# Patient Record
Sex: Male | Born: 1950 | Race: Black or African American | Hispanic: No | Marital: Single
Health system: Southern US, Community
[De-identification: ages and names within clinical notes are randomized; demographics above are authoritative.]

## PROBLEM LIST (undated history)

## (undated) DIAGNOSIS — W3400XA Accidental discharge from unspecified firearms or gun, initial encounter: Secondary | ICD-10-CM

## (undated) DIAGNOSIS — E119 Type 2 diabetes mellitus without complications: Secondary | ICD-10-CM

## (undated) DIAGNOSIS — F329 Major depressive disorder, single episode, unspecified: Secondary | ICD-10-CM

## (undated) DIAGNOSIS — H5440 Blindness, one eye, unspecified eye: Secondary | ICD-10-CM

## (undated) DIAGNOSIS — F1910 Other psychoactive substance abuse, uncomplicated: Secondary | ICD-10-CM

## (undated) DIAGNOSIS — F32A Depression, unspecified: Secondary | ICD-10-CM

## (undated) HISTORY — PX: LEG SURGERY: SHX1003

## (undated) HISTORY — PX: EYE SURGERY: SHX253

## (undated) HISTORY — PX: LUNG SURGERY: SHX703

---
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
## 1997-09-06 ENCOUNTER — Emergency Department (HOSPITAL_COMMUNITY): Admission: EM | Admit: 1997-09-06 | Discharge: 1997-09-06 | Payer: Self-pay | Admitting: Emergency Medicine

## 1999-03-04 ENCOUNTER — Emergency Department (HOSPITAL_COMMUNITY): Admission: EM | Admit: 1999-03-04 | Discharge: 1999-03-04 | Payer: Self-pay | Admitting: Emergency Medicine

## 1999-03-04 ENCOUNTER — Encounter: Payer: Self-pay | Admitting: Emergency Medicine

## 1999-05-13 ENCOUNTER — Encounter: Payer: Self-pay | Admitting: *Deleted

## 1999-05-13 ENCOUNTER — Emergency Department (HOSPITAL_COMMUNITY): Admission: EM | Admit: 1999-05-13 | Discharge: 1999-05-13 | Payer: Self-pay | Admitting: *Deleted

## 1999-05-16 ENCOUNTER — Emergency Department (HOSPITAL_COMMUNITY): Admission: EM | Admit: 1999-05-16 | Discharge: 1999-05-16 | Payer: Self-pay | Admitting: Emergency Medicine

## 1999-10-10 ENCOUNTER — Emergency Department (HOSPITAL_COMMUNITY): Admission: EM | Admit: 1999-10-10 | Discharge: 1999-10-10 | Payer: Self-pay | Admitting: Emergency Medicine

## 1999-10-10 ENCOUNTER — Encounter: Payer: Self-pay | Admitting: Emergency Medicine

## 2001-02-10 ENCOUNTER — Encounter: Payer: Self-pay | Admitting: Emergency Medicine

## 2001-02-10 ENCOUNTER — Emergency Department (HOSPITAL_COMMUNITY): Admission: EM | Admit: 2001-02-10 | Discharge: 2001-02-10 | Payer: Self-pay

## 2001-10-25 ENCOUNTER — Emergency Department (HOSPITAL_COMMUNITY): Admission: EM | Admit: 2001-10-25 | Discharge: 2001-10-25 | Payer: Self-pay | Admitting: Emergency Medicine

## 2005-07-26 ENCOUNTER — Emergency Department (HOSPITAL_COMMUNITY): Admission: EM | Admit: 2005-07-26 | Discharge: 2005-07-26 | Payer: Self-pay | Admitting: Emergency Medicine

## 2007-03-16 ENCOUNTER — Emergency Department (HOSPITAL_COMMUNITY): Admission: EM | Admit: 2007-03-16 | Discharge: 2007-03-16 | Payer: Self-pay | Admitting: Emergency Medicine

## 2009-07-31 ENCOUNTER — Emergency Department (HOSPITAL_COMMUNITY): Admission: EM | Admit: 2009-07-31 | Discharge: 2009-07-31 | Payer: Self-pay | Admitting: Emergency Medicine

## 2010-01-10 ENCOUNTER — Emergency Department (HOSPITAL_COMMUNITY): Admission: EM | Admit: 2010-01-10 | Discharge: 2010-01-10 | Payer: Self-pay | Admitting: Emergency Medicine

## 2010-02-19 DIAGNOSIS — R45851 Suicidal ideations: Secondary | ICD-10-CM

## 2010-02-19 HISTORY — DX: Suicidal ideations: R45.851

## 2010-03-03 ENCOUNTER — Emergency Department (HOSPITAL_COMMUNITY): Admission: EM | Admit: 2010-03-03 | Discharge: 2010-03-03 | Payer: Self-pay | Admitting: Emergency Medicine

## 2010-03-03 ENCOUNTER — Emergency Department (HOSPITAL_COMMUNITY): Admission: EM | Admit: 2010-03-03 | Discharge: 2010-03-04 | Payer: Self-pay | Admitting: Emergency Medicine

## 2010-03-04 ENCOUNTER — Ambulatory Visit: Payer: Self-pay | Admitting: Psychiatry

## 2010-03-04 ENCOUNTER — Inpatient Hospital Stay (HOSPITAL_COMMUNITY): Admission: AD | Admit: 2010-03-04 | Discharge: 2010-03-13 | Payer: Self-pay | Admitting: Psychiatry

## 2010-07-03 LAB — DIFFERENTIAL
Eosinophils Absolute: 0.2 10*3/uL (ref 0.0–0.7)
Eosinophils Relative: 5 % (ref 0–5)
Lymphocytes Relative: 32 % (ref 12–46)
Lymphs Abs: 1.3 10*3/uL (ref 0.7–4.0)
Monocytes Absolute: 0.4 10*3/uL (ref 0.1–1.0)
Neutrophils Relative %: 53 % (ref 43–77)

## 2010-07-03 LAB — BASIC METABOLIC PANEL
CO2: 28 mEq/L (ref 19–32)
Chloride: 104 mEq/L (ref 96–112)
Creatinine, Ser: 1.2 mg/dL (ref 0.4–1.5)
Glucose, Bld: 88 mg/dL (ref 70–99)

## 2010-07-03 LAB — RAPID URINE DRUG SCREEN, HOSP PERFORMED
Barbiturates: NOT DETECTED
Benzodiazepines: NOT DETECTED
Opiates: NOT DETECTED

## 2010-07-03 LAB — ETHANOL: Alcohol, Ethyl (B): <5 mg/dL (ref 0–10)

## 2010-07-03 LAB — CBC
HCT: 40 % (ref 39.0–52.0)
Hemoglobin: 13.6 g/dL (ref 13.0–17.0)
MCHC: 34 g/dL (ref 30.0–36.0)
Platelets: 202 10*3/uL (ref 150–400)

## 2010-07-10 LAB — BASIC METABOLIC PANEL
CO2: 28 mEq/L (ref 19–32)
Chloride: 102 mEq/L (ref 96–112)
Creatinine, Ser: 1.25 mg/dL (ref 0.4–1.5)
GFR calc Af Amer: >60 mL/min (ref >60)
GFR calc non Af Amer: 59 mL/min — ABNORMAL LOW (ref >60)
Glucose, Bld: 91 mg/dL (ref 70–99)
Sodium: 138 mEq/L (ref 135–145)

## 2010-07-10 LAB — DIFFERENTIAL
Basophils Absolute: 0 10*3/uL (ref 0.0–0.1)
Basophils Relative: 0 % (ref 0–1)
Eosinophils Absolute: 0.1 10*3/uL (ref 0.0–0.7)
Monocytes Absolute: 0.3 10*3/uL (ref 0.1–1.0)
Neutro Abs: 2.7 10*3/uL (ref 1.7–7.7)

## 2010-07-10 LAB — CBC
HCT: 39.3 % (ref 39.0–52.0)
Hemoglobin: 13.3 g/dL (ref 13.0–17.0)
RDW: 13.2 % (ref 11.5–15.5)

## 2010-07-10 LAB — HEPATIC FUNCTION PANEL
ALT: 68 U/L — ABNORMAL HIGH (ref 0–53)
AST: 137 U/L — ABNORMAL HIGH (ref 0–37)
Albumin: 3.9 g/dL (ref 3.5–5.2)
Total Bilirubin: 0.7 mg/dL (ref 0.3–1.2)

## 2010-07-10 LAB — ETHANOL: Alcohol, Ethyl (B): 58 mg/dL — ABNORMAL HIGH (ref 0–10)

## 2012-02-11 ENCOUNTER — Emergency Department (HOSPITAL_COMMUNITY): Payer: Self-pay

## 2012-02-11 ENCOUNTER — Encounter (HOSPITAL_COMMUNITY): Payer: Self-pay | Admitting: Physical Medicine and Rehabilitation

## 2012-02-11 ENCOUNTER — Emergency Department (HOSPITAL_COMMUNITY)
Admission: EM | Admit: 2012-02-11 | Discharge: 2012-02-11 | Disposition: A | Discharge status: Home / Self Care | Payer: Self-pay | Attending: Emergency Medicine | Admitting: Emergency Medicine

## 2012-02-11 DIAGNOSIS — F17200 Nicotine dependence, unspecified, uncomplicated: Secondary | ICD-10-CM | POA: Insufficient documentation

## 2012-02-11 DIAGNOSIS — G43909 Migraine, unspecified, not intractable, without status migrainosus: Secondary | ICD-10-CM | POA: Insufficient documentation

## 2012-02-11 DIAGNOSIS — J3489 Other specified disorders of nose and nasal sinuses: Secondary | ICD-10-CM | POA: Insufficient documentation

## 2012-02-11 MED ORDER — METOCLOPRAMIDE HCL 5 MG/ML IJ SOLN
10.0000 mg | Freq: Once | INTRAMUSCULAR | Status: AC
  Administered 2012-02-11: 10 mg via INTRAMUSCULAR
  Filled 2012-02-11: qty 2

## 2012-02-11 MED ORDER — DIPHENHYDRAMINE HCL 50 MG/ML IJ SOLN
25.0000 mg | Freq: Once | INTRAMUSCULAR | Status: AC
  Administered 2012-02-11: 14:00:00 via INTRAMUSCULAR
  Filled 2012-02-11: qty 1

## 2012-02-11 NOTE — ED Notes (Signed)
Patient transported to CT 

## 2012-02-11 NOTE — ED Provider Notes (Signed)
History   This chart was scribed for Toy Baker, MD by Charolett Bumpers . The patient was seen in room TR07C/TR07C. Patient's care was started at 1357.   CSN: [id number] Arrival date & time 02/11/12  1317  First MD Initiated Contact with Patient 02/11/12 1357    Chief Complaint  Patient presents with  . Headache  . Nasal Congestion   The history is provided by the patient. No language interpreter was used.  Jeremiah West is a 61 y.o. male who presents to the Emergency Department complaining of constant, moderate headache that started yesterday that while he was sleeping. He states it is located around his eyes and radiates to back of neck. He reports his symptoms are aggravated with light. He reports associated congestion and rhinorrhea. He denies any fevers, chills, vomiting or focal weakness. He reports a h/o similar migraines. Denies any known triggers. He denies taking anything for pain PTA.   No past medical history on file.  No past surgical history on file.  History reviewed. No pertinent family history.  History  Substance Use Topics  . Smoking status: Current Every Day Smoker  . Smokeless tobacco: Not on file  . Alcohol Use: Yes      Review of Systems  Constitutional: Negative for fever and chills.  HENT: Positive for congestion and rhinorrhea.   Respiratory: Negative for shortness of breath.   Gastrointestinal: Negative for nausea and vomiting.  Neurological: Positive for headaches. Negative for weakness.  All other systems reviewed and are negative.    Allergies  Review of patient's allergies indicates no known allergies.  Home Medications  No current outpatient prescriptions on file.  BP 142/69  Pulse 81  Temp 98 F (36.7 C) (Oral)  Resp 18  SpO2 96%  Physical Exam  Nursing note and vitals reviewed. Constitutional: He is oriented to person, place, and time. He appears well-developed and well-nourished.  Non-toxic appearance.  HENT:    Head: Normocephalic and atraumatic.  Eyes: Conjunctivae normal are normal.       Absent pupil on right. Left pupil is 3 mm, round and reactive.   Neck: Normal range of motion.  Cardiovascular: Normal rate.   Pulmonary/Chest: Effort normal.  Neurological: He is alert and oriented to person, place, and time.       Normal gait. Normal neuro exam with no deficits.   Skin: Skin is warm and dry.  Psychiatric: He has a normal mood and affect.    ED Course  Procedures (including critical care time)  DIAGNOSTIC STUDIES: Oxygen Saturation is 96% on room air, normal by my interpretation.    COORDINATION OF CARE:  14:01-Discussed planned course of treatment with the patient including a CT of head, Reglan and Benadryl, who is agreeable at this time.   14:15-Medication Orders: Metoclopramide (Reglan) injection 10 mg-once; Diphenhydramine (Benadryl) injection 25 mg-once.   15:21-Recheck: Informed pt of normal head CT results. Pt reports improvement of symptoms with ED medications. Will d/c home. Pt is agreeable.   Labs Reviewed - No data to display Ct Head Wo Contrast  02/11/2012  *RADIOLOGY REPORT*  Clinical Data: New onset headache.  CT HEAD WITHOUT CONTRAST  Technique:  Contiguous axial images were obtained from the base of the skull through the vertex without contrast.  Comparison: 08/01/2011  Findings: No sign of acute infarction, mass lesion, hemorrhage, hydrocephalus or extra-axial collection.  There is advanced confluent low density throughout the cerebral hemispheric white matter consistent with chronic small vessel disease.  Chronic changes of the right globe identified.  No sign of sinus inflammatory disease.  No calvarial abnormality.  IMPRESSION: No acute finding.  Advanced chronic small vessel disease throughout the hemispheric white matter.   Original Report Authenticated By: Thomasenia Sales, M.D.      No diagnosis found.    MDM   Patient given migraine cocktail here feels  better. Repeat neurological exam stable. Doubt subarachnoid hemorrhage or meningitis. He is to for discharge   I personally performed the services described in this documentation, which was scribed in my presence. The recorded information has been reviewed and considered.     Toy Baker, MD 02/11/12 [phone]

## 2012-02-11 NOTE — ED Notes (Signed)
C/o "headache" from back of neck up back of head.

## 2012-02-11 NOTE — ED Notes (Signed)
Pt presents to department for evaluation of nasal congestion and headache x2 days. Pt states "my nose has been runny." 5/10 pain at the time. Denies fever. He is conscious alert and oriented x4. No signs of distress noted.

## 2013-12-25 ENCOUNTER — Encounter (HOSPITAL_COMMUNITY): Payer: Self-pay | Admitting: Emergency Medicine

## 2013-12-25 ENCOUNTER — Emergency Department (HOSPITAL_COMMUNITY)
Admission: EM | Admit: 2013-12-25 | Discharge: 2013-12-25 | Disposition: A | Discharge status: Home / Self Care | Payer: Medicare Other | Attending: Emergency Medicine | Admitting: Emergency Medicine

## 2013-12-25 DIAGNOSIS — Z9889 Other specified postprocedural states: Secondary | ICD-10-CM | POA: Insufficient documentation

## 2013-12-25 DIAGNOSIS — G8911 Acute pain due to trauma: Secondary | ICD-10-CM | POA: Diagnosis not present

## 2013-12-25 DIAGNOSIS — F17200 Nicotine dependence, unspecified, uncomplicated: Secondary | ICD-10-CM | POA: Diagnosis not present

## 2013-12-25 DIAGNOSIS — M25569 Pain in unspecified knee: Secondary | ICD-10-CM | POA: Insufficient documentation

## 2013-12-25 DIAGNOSIS — M25562 Pain in left knee: Secondary | ICD-10-CM

## 2013-12-25 DIAGNOSIS — Z87828 Personal history of other (healed) physical injury and trauma: Secondary | ICD-10-CM | POA: Diagnosis not present

## 2013-12-25 DIAGNOSIS — E119 Type 2 diabetes mellitus without complications: Secondary | ICD-10-CM | POA: Diagnosis not present

## 2013-12-25 HISTORY — DX: Type 2 diabetes mellitus without complications: E11.9

## 2013-12-25 HISTORY — DX: Accidental discharge from unspecified firearms or gun, initial encounter: W34.00XA

## 2013-12-25 MED ORDER — HYDROCODONE-ACETAMINOPHEN 5-325 MG PO TABS
1.0000 | ORAL_TABLET | ORAL | Status: DC | PRN
Start: 2013-12-25 — End: 2018-02-17

## 2013-12-25 MED ORDER — HYDROCODONE-ACETAMINOPHEN 5-325 MG PO TABS
1.0000 | ORAL_TABLET | Freq: Once | ORAL | Status: AC
  Administered 2013-12-25: 1 via ORAL
  Filled 2013-12-25: qty 1
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
## 2013-12-25 NOTE — ED Provider Notes (Signed)
Medical screening examination/treatment/procedure(s) were performed by non-physician practitioner and as supervising physician I was immediately available for consultation/collaboration.   EKG Interpretation None        Lyanne Co, MD 12/25/13 2132

## 2013-12-25 NOTE — ED Notes (Signed)
Declined W/C at D/C and was escorted to lobby by RN. 

## 2013-12-25 NOTE — Discharge Instructions (Signed)
Arthralgia °Your caregiver has diagnosed you as suffering from an arthralgia. Arthralgia means there is pain in a joint. This can come from many reasons including: °· Bruising the joint which causes soreness (inflammation) in the joint. °· Wear and tear on the joints which occur as we grow older (osteoarthritis). °· Overusing the joint. °· Various forms of arthritis. °· Infections of the joint. °Regardless of the cause of pain in your joint, most of these different pains respond to anti-inflammatory drugs and rest. The exception to this is when a joint is infected, and these cases are treated with antibiotics, if it is a bacterial infection. °HOME CARE INSTRUCTIONS  °· Rest the injured area for as long as directed by your caregiver. Then slowly start using the joint as directed by your caregiver and as the pain allows. Crutches as directed may be useful if the ankles, knees or hips are involved. If the knee was splinted or casted, continue use and care as directed. If an stretchy or elastic wrapping bandage has been applied today, it should be removed and re-applied every 3 to 4 hours. It should not be applied tightly, but firmly enough to keep swelling down. Watch toes and feet for swelling, bluish discoloration, coldness, numbness or excessive pain. If any of these problems (symptoms) occur, remove the ace bandage and re-apply more loosely. If these symptoms persist, contact your caregiver or return to this location. °· For the first 24 hours, keep the injured extremity elevated on pillows while lying down. °· Apply ice for 15-20 minutes to the sore joint every couple hours while awake for the first half day. Then 03-04 times per day for the first 48 hours. Put the ice in a plastic bag and place a towel between the bag of ice and your skin. °· Wear any splinting, casting, elastic bandage applications, or slings as instructed. °· Only take over-the-counter or prescription medicines for pain, discomfort, or fever as  directed by your caregiver. Do not use aspirin immediately after the injury unless instructed by your physician. Aspirin can cause increased bleeding and bruising of the tissues. °· If you were given crutches, continue to use them as instructed and do not resume weight bearing on the sore joint until instructed. °Persistent pain and inability to use the sore joint as directed for more than 2 to 3 days are warning signs indicating that you should see a caregiver for a follow-up visit as soon as possible. Initially, a hairline fracture (break in bone) may not be evident on X-rays. Persistent pain and swelling indicate that further evaluation, non-weight bearing or use of the joint (use of crutches or slings as instructed), or further X-rays are indicated. X-rays may sometimes not show a small fracture until a week or 10 days later. Make a follow-up appointment with your own caregiver or one to whom we have referred you. A radiologist (specialist in reading X-rays) may read your X-rays. Make sure you know how you are to obtain your X-ray results. Do not assume everything is normal if you do not hear from us. °SEEK MEDICAL CARE IF: °Bruising, swelling, or pain increases. °SEEK IMMEDIATE MEDICAL CARE IF:  °· Your fingers or toes are numb or blue. °· The pain is not responding to medications and continues to stay the same or get worse. °· The pain in your joint becomes severe. °· You develop a fever over 102° F (38.9° C). °· It becomes impossible to move or use the joint. °MAKE SURE YOU:  °·   Understand these instructions. °· Will watch your condition. °· Will get help right away if you are not doing well or get worse. °Document Released: 04/07/2005 Document Revised: 06/30/2011 Document Reviewed: 11/24/2007 °Exit[employer]® Patient Information ©2015 [employer]. This information is not intended to replace advice given to you by your health care provider. Make sure you discuss any questions you have with your health care  provider. ° °Cryotherapy °Cryotherapy means treatment with cold. Ice or gel packs can be used to reduce both pain and swelling. Ice is the most helpful within the first 24 to 48 hours after an injury or flare-up from overusing a muscle or joint. Sprains, strains, spasms, burning pain, shooting pain, and aches can all be eased with ice. Ice can also be used when recovering from surgery. Ice is effective, has very few side effects, and is safe for most people to use. °PRECAUTIONS  °Ice is not a safe treatment option for people with: °· Raynaud phenomenon. This is a condition affecting small blood vessels in the extremities. Exposure to cold may cause your problems to return. °· Cold hypersensitivity. There are many forms of cold hypersensitivity, including: °¨ Cold urticaria. Red, itchy hives appear on the skin when the tissues begin to warm after being iced. °¨ Cold erythema. This is a red, itchy rash caused by exposure to cold. °¨ Cold hemoglobinuria. Red blood cells break down when the tissues begin to warm after being iced. The hemoglobin that carry oxygen are passed into the urine because they cannot combine with blood proteins fast enough. °· Numbness or altered sensitivity in the area being iced. °If you have any of the following conditions, do not use ice until you have discussed cryotherapy with your caregiver: °· Heart conditions, such as arrhythmia, angina, or chronic heart disease. °· High blood pressure. °· Healing wounds or open skin in the area being iced. °· Current infections. °· Rheumatoid arthritis. °· Poor circulation. °· Diabetes. °Ice slows the blood flow in the region it is applied. This is beneficial when trying to stop inflamed tissues from spreading irritating chemicals to surrounding tissues. However, if you expose your skin to cold temperatures for too long or without the proper protection, you can damage your skin or nerves. Watch for signs of skin damage due to cold. °HOME CARE  INSTRUCTIONS °Follow these tips to use ice and cold packs safely. °· Place a dry or damp towel between the ice and skin. A damp towel will cool the skin more quickly, so you may need to shorten the time that the ice is used. °· For a more rapid response, add gentle compression to the ice. °· Ice for no more than 10 to 20 minutes at a time. The bonier the area you are icing, the less time it will take to get the benefits of ice. °· Check your skin after 5 minutes to make sure there are no signs of a poor response to cold or skin damage. °· Rest 20 minutes or more between uses. °· Once your skin is numb, you can end your treatment. You can test numbness by very lightly touching your skin. The touch should be so light that you do not see the skin dimple from the pressure of your fingertip. When using ice, most people will feel these normal sensations in this order: cold, burning, aching, and numbness. °· Do not use ice on someone who cannot communicate their responses to pain, such as small children or people with dementia. °HOW TO MAKE   AN ICE PACK °Ice packs are the most common way to use ice therapy. Other methods include ice massage, ice baths, and cryosprays. Muscle creams that cause a cold, tingly feeling do not offer the same benefits that ice offers and should not be used as a substitute unless recommended by your caregiver. °To make an ice pack, do one of the following: °· Place crushed ice or a bag of frozen vegetables in a sealable plastic bag. Squeeze out the excess air. Place this bag inside another plastic bag. Slide the bag into a pillowcase or place a damp towel between your skin and the bag. °· Mix 3 parts water with 1 part rubbing alcohol. Freeze the mixture in a sealable plastic bag. When you remove the mixture from the freezer, it will be slushy. Squeeze out the excess air. Place this bag inside another plastic bag. Slide the bag into a pillowcase or place a damp towel between your skin and the  bag. °SEEK MEDICAL CARE IF: °· You develop white spots on your skin. This may give the skin a blotchy (mottled) appearance. °· Your skin turns blue or pale. °· Your skin becomes waxy or hard. °· Your swelling gets worse. °MAKE SURE YOU:  °· Understand these instructions. °· Will watch your condition. °· Will get help right away if you are not doing well or get worse. °Document Released: 12/02/2010 Document Revised: 08/22/2013 Document Reviewed: 12/02/2010 °ExitCare® Patient Information ©2015 [employer]. This information is not intended to replace advice given to you by your health care provider. Make sure you discuss any questions you have with your health care provider. ° °
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
## 2015-12-13 ENCOUNTER — Emergency Department (HOSPITAL_COMMUNITY): Payer: Medicare Other

## 2015-12-13 ENCOUNTER — Observation Stay (HOSPITAL_COMMUNITY)
Admission: EM | Admit: 2015-12-13 | Discharge: 2015-12-15 | Disposition: A | Discharge status: Home / Self Care | Payer: Medicare Other | Attending: Surgery | Admitting: Surgery

## 2015-12-13 ENCOUNTER — Encounter (HOSPITAL_COMMUNITY): Payer: Self-pay

## 2015-12-13 DIAGNOSIS — Z23 Encounter for immunization: Secondary | ICD-10-CM | POA: Insufficient documentation

## 2015-12-13 DIAGNOSIS — E119 Type 2 diabetes mellitus without complications: Secondary | ICD-10-CM | POA: Diagnosis not present

## 2015-12-13 DIAGNOSIS — S2242XA Multiple fractures of ribs, left side, initial encounter for closed fracture: Principal | ICD-10-CM | POA: Insufficient documentation

## 2015-12-13 DIAGNOSIS — S2249XA Multiple fractures of ribs, unspecified side, initial encounter for closed fracture: Secondary | ICD-10-CM

## 2015-12-13 DIAGNOSIS — F17200 Nicotine dependence, unspecified, uncomplicated: Secondary | ICD-10-CM | POA: Diagnosis not present

## 2015-12-13 HISTORY — DX: Accidental discharge from unspecified firearms or gun, initial encounter: W34.00XA

## 2015-12-13 LAB — I-STAT CHEM 8, ED
BUN: 12 mg/dL (ref 6–20)
CALCIUM ION: 1.18 mmol/L (ref 1.12–1.23)
Chloride: 101 mmol/L (ref 101–111)
Creatinine, Ser: 1.2 mg/dL (ref 0.61–1.24)
GLUCOSE: 104 mg/dL — AB (ref 65–99)
HCT: 46 % (ref 39.0–52.0)
HEMOGLOBIN: 15.6 g/dL (ref 13.0–17.0)
POTASSIUM: 3.8 mmol/L (ref 3.5–5.1)
Sodium: 139 mmol/L (ref 135–145)
TCO2: 25 mmol/L (ref 0–100)

## 2015-12-13 LAB — CBC
HEMATOCRIT: 42.5 % (ref 39.0–52.0)
HEMOGLOBIN: 14.4 g/dL (ref 13.0–17.0)
MCH: 33.5 pg (ref 26.0–34.0)
MCHC: 33.9 g/dL (ref 30.0–36.0)
MCV: 98.8 fL (ref 78.0–100.0)
PLATELETS: 175 10*3/uL (ref 150–400)
RBC: 4.3 MIL/uL (ref 4.22–5.81)
RDW: 12.9 % (ref 11.5–15.5)
WBC: 3.5 10*3/uL — ABNORMAL LOW (ref 4.0–10.5)

## 2015-12-13 LAB — ETHANOL: Alcohol, Ethyl (B): 81 mg/dL — ABNORMAL HIGH (ref <5)

## 2015-12-13 LAB — PROTIME-INR
INR: 0.99
Prothrombin Time: 13.1 seconds (ref 11.4–15.2)

## 2015-12-13 LAB — COMPREHENSIVE METABOLIC PANEL
ALT: 20 U/L (ref 17–63)
ANION GAP: 9 (ref 5–15)
AST: 33 U/L (ref 15–41)
Albumin: 4 g/dL (ref 3.5–5.0)
Alkaline Phosphatase: 70 U/L (ref 38–126)
BUN: 8 mg/dL (ref 6–20)
CHLORIDE: 102 mmol/L (ref 101–111)
CO2: 24 mmol/L (ref 22–32)
Calcium: 9.3 mg/dL (ref 8.9–10.3)
Creatinine, Ser: 1.32 mg/dL — ABNORMAL HIGH (ref 0.61–1.24)
GFR calc non Af Amer: 55 mL/min — ABNORMAL LOW (ref >60)
Glucose, Bld: 109 mg/dL — ABNORMAL HIGH (ref 65–99)
POTASSIUM: 3.9 mmol/L (ref 3.5–5.1)
SODIUM: 135 mmol/L (ref 135–145)
Total Bilirubin: 0.8 mg/dL (ref 0.3–1.2)
Total Protein: 6.9 g/dL (ref 6.5–8.1)

## 2015-12-13 MED ORDER — SODIUM CHLORIDE 0.9 % IV BOLUS (SEPSIS)
1000.0000 mL | Freq: Once | INTRAVENOUS | Status: AC
  Administered 2015-12-13: 1000 mL via INTRAVENOUS

## 2015-12-13 MED ORDER — IOPAMIDOL (ISOVUE-300) INJECTION 61%
INTRAVENOUS | Status: AC
  Administered 2015-12-13: 100 mL via INTRAVENOUS
  Filled 2015-12-13: qty 100

## 2015-12-13 MED ORDER — SODIUM CHLORIDE 0.9 % IV SOLN: INTRAVENOUS | Status: DC
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
## 2015-12-13 NOTE — ED Notes (Signed)
Pt transporting to CT at this time.  
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
## 2015-12-14 ENCOUNTER — Encounter (HOSPITAL_COMMUNITY): Payer: Self-pay | Admitting: *Deleted

## 2015-12-14 DIAGNOSIS — S2249XA Multiple fractures of ribs, unspecified side, initial encounter for closed fracture: Secondary | ICD-10-CM | POA: Diagnosis present

## 2015-12-14 DIAGNOSIS — S2242XA Multiple fractures of ribs, left side, initial encounter for closed fracture: Secondary | ICD-10-CM | POA: Diagnosis not present

## 2015-12-14 LAB — CBC
HCT: 37.7 % — ABNORMAL LOW (ref 39.0–52.0)
Hemoglobin: 12.7 g/dL — ABNORMAL LOW (ref 13.0–17.0)
MCH: 33.2 pg (ref 26.0–34.0)
MCHC: 33.7 g/dL (ref 30.0–36.0)
MCV: 98.4 fL (ref 78.0–100.0)
PLATELETS: 167 10*3/uL (ref 150–400)
RBC: 3.83 MIL/uL — ABNORMAL LOW (ref 4.22–5.81)
RDW: 13 % (ref 11.5–15.5)
WBC: 6.1 10*3/uL (ref 4.0–10.5)

## 2015-12-14 LAB — BASIC METABOLIC PANEL
Anion gap: 6 (ref 5–15)
BUN: 5 mg/dL — AB (ref 6–20)
CO2: 25 mmol/L (ref 22–32)
CREATININE: 0.92 mg/dL (ref 0.61–1.24)
Calcium: 8.5 mg/dL — ABNORMAL LOW (ref 8.9–10.3)
Chloride: 103 mmol/L (ref 101–111)
GFR calc Af Amer: >60 mL/min (ref >60)
Glucose, Bld: 110 mg/dL — ABNORMAL HIGH (ref 65–99)
Potassium: 3.7 mmol/L (ref 3.5–5.1)
SODIUM: 134 mmol/L — AB (ref 135–145)

## 2015-12-14 LAB — SAMPLE TO BLOOD BANK

## 2015-12-14 LAB — RAPID URINE DRUG SCREEN, HOSP PERFORMED
AMPHETAMINES: NOT DETECTED
Barbiturates: NOT DETECTED
Benzodiazepines: NOT DETECTED
Cocaine: POSITIVE — AB
OPIATES: NOT DETECTED
Tetrahydrocannabinol: NOT DETECTED

## 2015-12-14 MED ORDER — OXYCODONE HCL 5 MG PO TABS
5.0000 mg | ORAL_TABLET | ORAL | Status: DC | PRN
  Administered 2015-12-15 (×2): 10 mg via ORAL
  Filled 2015-12-14: qty 3
  Filled 2015-12-14 (×2): qty 2

## 2015-12-14 MED ORDER — SODIUM CHLORIDE 0.9 % IV SOLN
INTRAVENOUS | Status: DC
  Administered 2015-12-14: 01:00:00 via INTRAVENOUS

## 2015-12-14 MED ORDER — ONDANSETRON HCL 4 MG/2ML IJ SOLN: 4.0000 mg | Freq: Four times a day (QID) | INTRAMUSCULAR | Status: DC | PRN

## 2015-12-14 MED ORDER — FENTANYL CITRATE (PF) 100 MCG/2ML IJ SOLN
50.0000 ug | INTRAMUSCULAR | Status: DC | PRN
  Administered 2015-12-14: 50 ug via INTRAVENOUS
  Filled 2015-12-14: qty 2

## 2015-12-14 MED ORDER — TRAMADOL HCL 50 MG PO TABS
100.0000 mg | ORAL_TABLET | Freq: Four times a day (QID) | ORAL | Status: DC
  Administered 2015-12-14 – 2015-12-15 (×5): 100 mg via ORAL
  Filled 2015-12-14 (×5): qty 2

## 2015-12-14 MED ORDER — ENOXAPARIN SODIUM 40 MG/0.4ML ~~LOC~~ SOLN
40.0000 mg | Freq: Every day | SUBCUTANEOUS | Status: DC
  Administered 2015-12-14 (×2): 40 mg via SUBCUTANEOUS
  Filled 2015-12-14 (×2): qty 0.4

## 2015-12-14 MED ORDER — BISACODYL 10 MG RE SUPP: 10.0000 mg | Freq: Every day | RECTAL | Status: DC | PRN

## 2015-12-14 MED ORDER — HYDROMORPHONE HCL 1 MG/ML IJ SOLN
1.0000 mg | INTRAMUSCULAR | Status: DC | PRN
  Administered 2015-12-14: 2 mg via INTRAVENOUS
  Filled 2015-12-14: qty 2

## 2015-12-14 MED ORDER — ONDANSETRON HCL 4 MG PO TABS: 4.0000 mg | ORAL_TABLET | Freq: Four times a day (QID) | ORAL | Status: DC | PRN

## 2015-12-14 MED ORDER — DOCUSATE SODIUM 100 MG PO CAPS
100.0000 mg | ORAL_CAPSULE | Freq: Two times a day (BID) | ORAL | Status: DC
  Administered 2015-12-14 – 2015-12-15 (×3): 100 mg via ORAL
  Filled 2015-12-14 (×4): qty 1

## 2015-12-14 MED ORDER — NAPROXEN 250 MG PO TABS
500.0000 mg | ORAL_TABLET | Freq: Two times a day (BID) | ORAL | Status: DC
  Administered 2015-12-14 – 2015-12-15 (×3): 500 mg via ORAL
  Filled 2015-12-14 (×3): qty 2

## 2015-12-14 MED ORDER — POLYETHYLENE GLYCOL 3350 17 G PO PACK
17.0000 g | PACK | Freq: Every day | ORAL | Status: DC
  Administered 2015-12-15: 17 g via ORAL
  Filled 2015-12-14 (×2): qty 1

## 2015-12-14 MED ORDER — HYDROMORPHONE HCL 1 MG/ML IJ SOLN: 0.5000 mg | INTRAMUSCULAR | Status: DC | PRN

## 2015-12-14 MED ORDER — OXYCODONE HCL 5 MG PO TABS: 5.0000 mg | ORAL_TABLET | ORAL | Status: DC | PRN
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
## 2015-12-14 NOTE — Progress Notes (Signed)
Patient ID: Jeremiah West, male   DOB: [birth year], 65 y.o.   MRN: [id number]   LOS: 0 days   Subjective: C/o severe pain   Objective: Vital signs in last 24 hours: Temp:  [97.5 F (36.4 C)-98.5 F (36.9 C)] 98.5 F (36.9 C) (08/25 0517) Pulse Rate:  [61-79] 65 (08/25 0517) Resp:  [14-22] 18 (08/25 0517) BP: (118-171)/(68-93) 118/68 (08/25 0517) SpO2:  [91 %-99 %] 92 % (08/25 0517) Weight:  [67.4 kg (148 lb 8 oz)-77.1 kg (170 lb)] 67.4 kg (148 lb 8 oz) (08/25 0119) Last BM Date: 12/13/15   IS: 250ml   Laboratory  CBC  Recent Labs  12/13/15 2212 12/13/15 2229 12/14/15 0606  WBC 3.5*  --  6.1  HGB 14.4 15.6 12.7*  HCT 42.5 46.0 37.7*  PLT 175  --  167   BMET  Recent Labs  12/13/15 2212 12/13/15 2229 12/14/15 0606  NA 135 139 134*  K 3.9 3.8 3.7  CL 102 101 103  CO2 24  --  25  GLUCOSE 109* 104* 110*  BUN 8 12 5*  CREATININE 1.32* 1.20 0.92  CALCIUM 9.3  --  8.5*    Physical Exam General appearance: alert and no distress Resp: clear to auscultation bilaterally Cardio: regular rate and rhythm GI: normal findings: bowel sounds normal and soft, non-tender   Assessment/Plan: BCC Multiple left rib fxs -- Pulmonary toilet Right rib fx FEN -- Add NSAID and tramadol VTE -- SCD's, Lovenox Dispo -- PT/OT    Freeman CaldronMichael J. Ted Leonhart, PA-C Pager: [phone] General Trauma PA Pager: [phone]  12/14/2015

## 2015-12-15 ENCOUNTER — Observation Stay (HOSPITAL_COMMUNITY): Payer: Medicare Other

## 2015-12-15 DIAGNOSIS — S2242XA Multiple fractures of ribs, left side, initial encounter for closed fracture: Secondary | ICD-10-CM | POA: Diagnosis not present

## 2015-12-15 MED ORDER — TRAMADOL HCL 50 MG PO TABS: 100.0000 mg | ORAL_TABLET | Freq: Four times a day (QID) | ORAL | 0 refills | Status: DC

## 2015-12-15 MED ORDER — PNEUMOCOCCAL VAC POLYVALENT 25 MCG/0.5ML IJ INJ
0.5000 mL | INJECTION | INTRAMUSCULAR | Status: AC
  Administered 2015-12-15: 0.5 mL via INTRAMUSCULAR
  Filled 2015-12-15: qty 0.5

## 2015-12-15 MED ORDER — OXYCODONE-ACETAMINOPHEN 5-325 MG PO TABS: 1.0000 | ORAL_TABLET | ORAL | 0 refills | Status: DC | PRN

## 2015-12-15 MED ORDER — NAPROXEN 500 MG PO TABS: 500.0000 mg | ORAL_TABLET | Freq: Two times a day (BID) | ORAL | 1 refills | Status: DC

## 2015-12-15 NOTE — Progress Notes (Addendum)
OT Cancellation Note  Patient Details Name: Jeremiah West MRN: [id number] DOB: [birth year]   Cancelled Treatment:    Reason Eval/Treat Not Completed: Pain limiting ability to participate. Pt reports he just finished with PT and is 10/10 pain in R rib and posterior back in between shoulder blades. Nursing notified. WIll reattempt OT eval as schedule permits.    Pearletha FurlMathews, Marlee Trentman H  Arielys Wandersee OTR/L Pager: [phone]  12/15/2015, 10:20 AM
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
## 2015-12-15 NOTE — Discharge Instructions (Signed)
Increase activity as pain allows. ° °No driving while taking oxycodone. °

## 2015-12-15 NOTE — Clinical Social Work Note (Signed)
CSW informed that pt discharging home from hospital and that pt requesting information regarding legal services. CSW met with pt and provided number to legal aid. Pt also requested taxi voucher to get home. CSW provided voucher and left with nurse. CSW informed that pt later stated that his sister will transport him home from hospital.
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
## 2015-12-17 ENCOUNTER — Encounter (HOSPITAL_COMMUNITY): Payer: Self-pay | Admitting: Emergency Medicine

## 2015-12-25 ENCOUNTER — Telehealth (HOSPITAL_COMMUNITY): Payer: Self-pay

## 2015-12-25 NOTE — Telephone Encounter (Signed)
Attempted to return call to patient, above number not correct. Called phone number listed in chart [phone]) and did not get answer. Left voicemail asking him to call back to make appointment.

## 2015-12-26 ENCOUNTER — Telehealth (HOSPITAL_COMMUNITY): Payer: Self-pay

## 2015-12-26 NOTE — Telephone Encounter (Signed)
Scheduled him appt for 9/13

## 2015-12-26 NOTE — Telephone Encounter (Signed)
Scheduled appt for 9/13 at 1400.

## 2016-01-02 ENCOUNTER — Emergency Department (HOSPITAL_COMMUNITY): Admission: EM | Admit: 2016-01-02 | Payer: Medicare Other | Source: Home / Self Care

## 2016-01-17 ENCOUNTER — Telehealth (HOSPITAL_COMMUNITY): Payer: Self-pay

## 2016-01-17 ENCOUNTER — Other Ambulatory Visit (INDEPENDENT_AMBULATORY_CARE_PROVIDER_SITE_OTHER): Payer: Self-pay | Admitting: Physician Assistant

## 2016-01-17 MED ORDER — OXYCODONE-ACETAMINOPHEN 5-325 MG PO TABS: 1.0000 | ORAL_TABLET | ORAL | 0 refills | Status: DC | PRN
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
## 2017-07-23 ENCOUNTER — Emergency Department (HOSPITAL_COMMUNITY): Payer: Medicare Other

## 2017-07-23 ENCOUNTER — Emergency Department (HOSPITAL_COMMUNITY)
Admission: EM | Admit: 2017-07-23 | Discharge: 2017-07-24 | Disposition: A | Discharge status: Home / Self Care | Payer: Medicare Other | Attending: Emergency Medicine | Admitting: Emergency Medicine

## 2017-07-23 ENCOUNTER — Encounter (HOSPITAL_COMMUNITY): Payer: Self-pay | Admitting: Emergency Medicine

## 2017-07-23 ENCOUNTER — Other Ambulatory Visit: Payer: Self-pay

## 2017-07-23 DIAGNOSIS — F329 Major depressive disorder, single episode, unspecified: Secondary | ICD-10-CM | POA: Diagnosis present

## 2017-07-23 DIAGNOSIS — F1410 Cocaine abuse, uncomplicated: Secondary | ICD-10-CM | POA: Insufficient documentation

## 2017-07-23 DIAGNOSIS — J181 Lobar pneumonia, unspecified organism: Secondary | ICD-10-CM | POA: Diagnosis not present

## 2017-07-23 DIAGNOSIS — R45851 Suicidal ideations: Secondary | ICD-10-CM | POA: Diagnosis not present

## 2017-07-23 DIAGNOSIS — E119 Type 2 diabetes mellitus without complications: Secondary | ICD-10-CM | POA: Diagnosis not present

## 2017-07-23 DIAGNOSIS — Z5900 Homelessness unspecified: Secondary | ICD-10-CM

## 2017-07-23 DIAGNOSIS — F32A Depression, unspecified: Secondary | ICD-10-CM

## 2017-07-23 DIAGNOSIS — F17210 Nicotine dependence, cigarettes, uncomplicated: Secondary | ICD-10-CM | POA: Diagnosis not present

## 2017-07-23 DIAGNOSIS — J189 Pneumonia, unspecified organism: Secondary | ICD-10-CM

## 2017-07-23 LAB — CBC
HCT: 43.6 % (ref 39.0–52.0)
Hemoglobin: 14.6 g/dL (ref 13.0–17.0)
MCH: 33 pg (ref 26.0–34.0)
MCHC: 33.5 g/dL (ref 30.0–36.0)
MCV: 98.4 fL (ref 78.0–100.0)
PLATELETS: 192 10*3/uL (ref 150–400)
RBC: 4.43 MIL/uL (ref 4.22–5.81)
RDW: 13.5 % (ref 11.5–15.5)
WBC: 3.4 10*3/uL — AB (ref 4.0–10.5)

## 2017-07-23 LAB — RAPID URINE DRUG SCREEN, HOSP PERFORMED
AMPHETAMINES: NOT DETECTED
BENZODIAZEPINES: NOT DETECTED
Barbiturates: NOT DETECTED
COCAINE: POSITIVE — AB
Opiates: NOT DETECTED
Tetrahydrocannabinol: NOT DETECTED

## 2017-07-23 LAB — COMPREHENSIVE METABOLIC PANEL
ALT: 16 U/L — ABNORMAL LOW (ref 17–63)
AST: 23 U/L (ref 15–41)
Albumin: 4 g/dL (ref 3.5–5.0)
Alkaline Phosphatase: 77 U/L (ref 38–126)
Anion gap: 12 (ref 5–15)
BUN: 5 mg/dL — ABNORMAL LOW (ref 6–20)
CHLORIDE: 103 mmol/L (ref 101–111)
CO2: 23 mmol/L (ref 22–32)
Calcium: 9.1 mg/dL (ref 8.9–10.3)
Creatinine, Ser: 0.93 mg/dL (ref 0.61–1.24)
Glucose, Bld: 99 mg/dL (ref 65–99)
POTASSIUM: 3.5 mmol/L (ref 3.5–5.1)
SODIUM: 138 mmol/L (ref 135–145)
Total Bilirubin: 1 mg/dL (ref 0.3–1.2)
Total Protein: 7.3 g/dL (ref 6.5–8.1)

## 2017-07-23 LAB — SALICYLATE LEVEL

## 2017-07-23 LAB — ACETAMINOPHEN LEVEL

## 2017-07-23 LAB — ETHANOL: ALCOHOL ETHYL (B): 62 mg/dL — AB (ref <10)

## 2017-07-23 MED ORDER — DOXYCYCLINE HYCLATE 100 MG PO TABS
100.0000 mg | ORAL_TABLET | Freq: Two times a day (BID) | ORAL | Status: DC
  Administered 2017-07-23 – 2017-07-24 (×2): 100 mg via ORAL
  Filled 2017-07-23 (×2): qty 1

## 2017-07-23 MED ORDER — HYDROCORTISONE 1 % EX CREA
TOPICAL_CREAM | Freq: Two times a day (BID) | CUTANEOUS | Status: DC
  Filled 2017-07-23: qty 28

## 2017-07-23 NOTE — ED Notes (Signed)
Pt is unable to take a silver ring off of his left middle finger. It is stuck an tight.

## 2017-07-23 NOTE — ED Notes (Signed)
Pt Called for vitals. No answer. ?

## 2017-07-23 NOTE — ED Notes (Signed)
Patient denies pain and is resting comfortably.  

## 2017-07-23 NOTE — ED Notes (Signed)
Pt in A hallway  
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
## 2017-07-23 NOTE — ED Triage Notes (Signed)
Onset 2 weeks ago states he is depressed and suicidal because he is homeless now.  Denies HI. Calm cooperative.

## 2017-07-23 NOTE — ED Notes (Signed)
Given urine specimen cup.

## 2017-07-23 NOTE — ED Notes (Signed)
Regular Diet was ordered for Dinner. 

## 2017-07-23 NOTE — ED Notes (Signed)
Meal trey at bedside 
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
## 2017-07-24 ENCOUNTER — Other Ambulatory Visit: Payer: Self-pay

## 2017-07-24 MED ORDER — HYDROCORTISONE 1 % EX CREA: TOPICAL_CREAM | CUTANEOUS | 0 refills | Status: DC

## 2017-07-24 MED ORDER — DOXYCYCLINE HYCLATE 100 MG PO CAPS: 100.0000 mg | ORAL_CAPSULE | Freq: Two times a day (BID) | ORAL | 0 refills | Status: DC

## 2017-07-24 NOTE — ED Notes (Signed)
Snack and drink provided. ?

## 2017-07-24 NOTE — Progress Notes (Signed)
Per pt's assessment, he is psychiatrically cleared.  Disposition CSW notified [facility] CSW, Georgia DomKierra W., LCSWA that patient has Malachi BondsGloria, the Director of [facility] [phone]), coming to see him today for an assessment.  CSW contacted Malachi BondsGloria and relayed CSW contact information.   Timmothy EulerJean T. Kaylyn LimSutter, MSW, LCSWA Disposition Clinical Social Work [phone] (cell) [phone] (office)

## 2017-07-24 NOTE — Discharge Instructions (Addendum)
Substance Abuse Treatment Programs ° °Intensive Outpatient Programs °[facility]     °[address]      °[place], [place]                   °336-878-6098      ° °[facility] °[address] °[place], [place] °336-379-7146 ° °[facility]     °(Inpatient and outpatient)     °[address]           °336-832-9800   ° °[facility] °336-288-1484 (Suboxone and Methadone) ° °[address]      °[place], [place] [address]      °336-882-2125      ° °[address] °[place], [place] °852-3033 ° °[facility] (Outpatient/Inpatient, Chemical)    °(insurance only) 336-621-3381      °       °[facility] (Groups & Residential) °[place], [place] °336-389-1413 ° °   °[facility]     °[address]     °[place], [place]      °336-389-1413      ° °[facility] (for caregivers and family) °[address] °[place], [place] °336-299-4655 ° ° ° ° ° °Residential Treatment Programs °[facility]      °[address], [place], [place] [address]  °(336) [phone]      ° °[facility] °[address] [place], [place] [address] °919-419-1059 ° °[facility]        °336-248-8914      ° °[facility] °1-800-659-3381 ° °[facility]             °[address]                                         °[place], [place]                                                °877-615-2722 or 336-784-9470                              ° °[facility] [place] °[address] °[place], [address] °1.877.941.8954 ° °[facility]    °[address]      °, [place]     °336-273-5306      ° °[facility] °[address] °, [place] °336-285-9073 ° °[facility]   °[address]     °[place], [place] [address]     °336-899-1550      °Admissions: 8am-3pm M-F ° °[facility] °[address] °[place] °336-227-7417 ° °[facility]: Residential Program (90 Days)   °[place], [place]      °336-725-8389 or 800-758-6077    ° °[facility] °[place], [place] °(Walk in Hours over the weekend or by referral) ° °[facility] °[address], [place], [place] [address] °(336) 723-1848 ° °Crisis Mobile: [facility]:  1-877-626-1772 (for crisis response 24 hours a day) °[facility] Hotline:      1-800-256-2452 °Outpatient Psychiatry and Counseling ° °[facility]: Mobile Crisis   Management 24 hours:  1-877-626-1772 ° °[facility] sliding scale fee and walk in schedule: M-F 8am-12pm/1pm-3pm °[address]  °[place], [place] [address] °336-387-6161 ° °[facility] °[address] °[place], [place] [address] °336-703-9650 ° °[facility] ([facility])- new patient walk-in appointments available Monday - Friday 8am -3pm.          °[address] °[place], [place] [address] °336-676-6840 or crisis line- 336-676-6905 ° °[facility]/ Intensive Outpatient Therapy Program °[address] °[place], [place] [address] °336-832-9804 ° °[facility]                  °Crisis Services      °336.641.4993      °[address]     °[place], [place] [address]                ° °[facility]   °[facility] °800.525.9375 °[address] °[place], [place] [address] ° ° °[facility]          °[address]  °[place], [place] [address]       °(336) [phone] ° °[facility] °[address] °[place], [place] [address] °336-292-1510 ° °[facility]    °[address]    °[place], [place] [address]     °336-632-3505      ° °Parish McKinney, [place]     °[address]     °[place] [address]     °336-282-1251     °  °[facility] °[address] °[place] [address] ° °[facility]     °[address]     °[place], [place]      °336-542-2076      ° °[facility] °Shamsher Ahluwalia, MD °[address] °[place], [place] [address] °336-420-9558 ° °[facility]     °[address]     °[place], [place] [address]     °336-274-1237      ° °[facility] °[address] °[place], [place] [address] °336-854-4450 °Resources for Temporary Residential Assistance/Crisis Centers ° °DAY CENTERS °[facility] °M-F 8am-3pm   °[address] [place], [place] [address]   336-332-0824 °Services include: laundry, barbering, support groups, case management, phone  & computer access, showers, AA/NA mtgs, mental health/substance abuse nurse, job skills class, disability information, VA assistance, spiritual classes, etc.  ° °HOMELESS SHELTERS ° °[facility]     °[facility]   °[address], [place]     °336.271.5959       °       °[facility] (women and children)       °[address] °[place], [place] [address] °336-275-0820 °Maryshouse@[email or web address] for application and process °Application Required ° °[facility]   °[address]    °[place] [address]     °336.886.4922       °             °[facility] °[address] °[place], [place] [address] °336.273.5572 °336-235-0363(schedule application appt.) °Application Required ° °[facility] (women only)    °[address]     °[place], [place] [address]     °336-884-1039      °  Intake starts 6pm daily Need valid ID, SSC, & Police report [facility] [place] [address] [place], [place] [phone] Application Required  [facility] (men only)     [address]      [place], [place]     [phone]       [facility] [place] (Pregnant women only) [address] [place], [place] [phone]  [facility]      [address]      [place], [place] [address]     [phone]             [facility] [address] [place], [place] [phone] 90 day commitment/SA/Application process  [facility](men only)     [address]     [place], [place]     [phone]       Check-in at [facility]pm            [facility] [address] [place], [place] [address] [phone] Men/Women/Women and Children must be there by 7 pm  [facility] [place], [place] [phone]                  Suicide Resources  Who to Call Call 911 National Suicide Prevention Hotline 1-800-SUICIDE or (800) (917)877-6352273-TALK(8255) [facility] at 850-306-9028(336) (820) 119-6364; 215-031-3588(800) 801-286-2559  More Resources Suicide Awareness Voices of Education       231 571 1831(952) 805-738-6119        www.save.org The [employer] on Mental Illness(NAMI)       (800) 950-NAMI        www.nami.org American Association of Suicidology       [phone](202) 407-689-8723        www.suicidology.org

## 2017-07-24 NOTE — ED Notes (Addendum)
NAD. Will continue to monitor

## 2017-07-24 NOTE — Progress Notes (Signed)
CSW spoke with Malachi BondsGloria and pt at bedside. CSW was informed that pt expressed not wanting to go to [facility] as pt will not be allowed to drink. Pt expressed that he wanted to go to a shelter as a result of not being able to drink at the home. CSW confirmed this with pt and provided pt with bus pass and shelter list as pt requested. At this time there are no further CSW needs. CSW will sign off.    Claude MangesKierra S. Binnie Vonderhaar, MSW, LCSW-A Emergency Department Clinical Social Worker [phone]

## 2017-07-24 NOTE — Progress Notes (Addendum)
10:40am- CSW received call from [place] with [facility]. She is planning to come and see pt today after lunch.  CSW received [facility] from Car[facility]Jean [facility] and was informed that Malachi BondsGloria from "[facility]" will be coming to interview pt today for possible placement. CSW will continue to follow for any further needs at this time.    Jeremiah West, MSW, LCSW-A Emergency Department Clinical Social Worker [phone]

## 2017-07-24 NOTE — ED Notes (Addendum)
Breakfast tray ordered 

## 2017-07-24 NOTE — ED Provider Notes (Addendum)
2:59 PM Patient is being placed by [facility] psychotherapistsocial worker.  FL 2 filled out.  He otherwise appears stable for discharge when he has housing available.  This will likely be Monday.  Continue antibiotic treatment for possible pneumonia.   Pricilla LovelessGoldston, Melea Prezioso, MD 07/24/17 1459  3:11 PM Patient now wants to go live in a shelter. Does not want to live at the house offered because he would not be allowed to drink and do drugs. Denies current SI. Psych has cleared patient. VSS, will treat with antibiotics for possible PNA with his recent cough. Return precautions.   Pricilla LovelessGoldston, Kolyn Rozario, MD 07/24/17 [phone]

## 2017-07-28 ENCOUNTER — Emergency Department (HOSPITAL_COMMUNITY)
Admission: EM | Admit: 2017-07-28 | Discharge: 2017-07-28 | Disposition: A | Discharge status: Home / Self Care | Payer: Medicare Other | Attending: Emergency Medicine | Admitting: Emergency Medicine

## 2017-07-28 ENCOUNTER — Other Ambulatory Visit: Payer: Self-pay

## 2017-07-28 ENCOUNTER — Encounter (HOSPITAL_COMMUNITY): Payer: Self-pay

## 2017-07-28 DIAGNOSIS — Z5900 Homelessness unspecified: Secondary | ICD-10-CM

## 2017-07-28 DIAGNOSIS — F330 Major depressive disorder, recurrent, mild: Secondary | ICD-10-CM | POA: Diagnosis not present

## 2017-07-28 DIAGNOSIS — E119 Type 2 diabetes mellitus without complications: Secondary | ICD-10-CM | POA: Diagnosis not present

## 2017-07-28 DIAGNOSIS — F17210 Nicotine dependence, cigarettes, uncomplicated: Secondary | ICD-10-CM | POA: Insufficient documentation

## 2017-07-28 DIAGNOSIS — R45851 Suicidal ideations: Secondary | ICD-10-CM | POA: Diagnosis present

## 2017-07-28 LAB — CBC
HCT: 41.9 % (ref 39.0–52.0)
Hemoglobin: 14.6 g/dL (ref 13.0–17.0)
MCH: 34.3 pg — AB (ref 26.0–34.0)
MCHC: 34.8 g/dL (ref 30.0–36.0)
MCV: 98.4 fL (ref 78.0–100.0)
Platelets: 197 10*3/uL (ref 150–400)
RBC: 4.26 MIL/uL (ref 4.22–5.81)
RDW: 13.2 % (ref 11.5–15.5)
WBC: 3.6 10*3/uL — ABNORMAL LOW (ref 4.0–10.5)

## 2017-07-28 LAB — COMPREHENSIVE METABOLIC PANEL
ALT: 18 U/L (ref 17–63)
AST: 25 U/L (ref 15–41)
Albumin: 3.9 g/dL (ref 3.5–5.0)
Alkaline Phosphatase: 81 U/L (ref 38–126)
Anion gap: 8 (ref 5–15)
BILIRUBIN TOTAL: 0.7 mg/dL (ref 0.3–1.2)
BUN: 12 mg/dL (ref 6–20)
CO2: 25 mmol/L (ref 22–32)
CREATININE: 1.13 mg/dL (ref 0.61–1.24)
Calcium: 9.4 mg/dL (ref 8.9–10.3)
Chloride: 105 mmol/L (ref 101–111)
GFR calc Af Amer: >60 mL/min (ref >60)
Glucose, Bld: 207 mg/dL — ABNORMAL HIGH (ref 65–99)
Potassium: 3.6 mmol/L (ref 3.5–5.1)
Sodium: 138 mmol/L (ref 135–145)
TOTAL PROTEIN: 7.2 g/dL (ref 6.5–8.1)

## 2017-07-28 LAB — SALICYLATE LEVEL: Salicylate Lvl: <7 mg/dL (ref 2.8–30.0)

## 2017-07-28 LAB — ACETAMINOPHEN LEVEL: Acetaminophen (Tylenol), Serum: <10 ug/mL — ABNORMAL LOW (ref 10–30)

## 2017-07-28 LAB — ETHANOL
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
## 2017-07-28 NOTE — ED Notes (Signed)
Bed: [facility] Expected date:  Expected time:  Means of arrival:  Comments: No bed. 

## 2017-07-28 NOTE — ED Triage Notes (Signed)
Pt arrived via [employer]. Per [employer] pt  [facility] that [facility] wanted to jump in front of a train and kill himself.  Pt reports that he does not want to harm himself but has had thoughts, pt reports because he had lost his apartment and is upset about that.

## 2017-08-03 ENCOUNTER — Observation Stay (HOSPITAL_COMMUNITY)
Admission: EM | Admit: 2017-08-03 | Discharge: 2017-08-07 | Disposition: A | Discharge status: Skilled Nursing Facility | Payer: Medicare Other | Attending: General Surgery | Admitting: General Surgery

## 2017-08-03 ENCOUNTER — Encounter (HOSPITAL_COMMUNITY): Payer: Self-pay

## 2017-08-03 ENCOUNTER — Emergency Department (HOSPITAL_COMMUNITY): Payer: Medicare Other

## 2017-08-03 DIAGNOSIS — T1490XA Injury, unspecified, initial encounter: Secondary | ICD-10-CM | POA: Diagnosis present

## 2017-08-03 DIAGNOSIS — S8002XA Contusion of left knee, initial encounter: Secondary | ICD-10-CM | POA: Insufficient documentation

## 2017-08-03 DIAGNOSIS — S301XXA Contusion of abdominal wall, initial encounter: Principal | ICD-10-CM | POA: Insufficient documentation

## 2017-08-03 DIAGNOSIS — R58 Hemorrhage, not elsewhere classified: Secondary | ICD-10-CM

## 2017-08-03 DIAGNOSIS — S32029A Unspecified fracture of second lumbar vertebra, initial encounter for closed fracture: Secondary | ICD-10-CM | POA: Diagnosis not present

## 2017-08-03 DIAGNOSIS — S0990XA Unspecified injury of head, initial encounter: Secondary | ICD-10-CM | POA: Insufficient documentation

## 2017-08-03 DIAGNOSIS — T80818A Extravasation of other vesicant agent, initial encounter: Secondary | ICD-10-CM | POA: Insufficient documentation

## 2017-08-03 DIAGNOSIS — Z77090 Contact with and (suspected) exposure to asbestos: Secondary | ICD-10-CM | POA: Diagnosis not present

## 2017-08-03 DIAGNOSIS — I7389 Other specified peripheral vascular diseases: Secondary | ICD-10-CM | POA: Diagnosis not present

## 2017-08-03 DIAGNOSIS — Y9301 Activity, walking, marching and hiking: Secondary | ICD-10-CM | POA: Diagnosis not present

## 2017-08-03 DIAGNOSIS — Z23 Encounter for immunization: Secondary | ICD-10-CM | POA: Diagnosis not present

## 2017-08-03 DIAGNOSIS — Y92410 Unspecified street and highway as the place of occurrence of the external cause: Secondary | ICD-10-CM | POA: Insufficient documentation

## 2017-08-03 DIAGNOSIS — J449 Chronic obstructive pulmonary disease, unspecified: Secondary | ICD-10-CM | POA: Insufficient documentation

## 2017-08-03 DIAGNOSIS — S32009A Unspecified fracture of unspecified lumbar vertebra, initial encounter for closed fracture: Secondary | ICD-10-CM | POA: Diagnosis present

## 2017-08-03 HISTORY — DX: Blindness, one eye, unspecified eye: H54.40

## 2017-08-03 HISTORY — DX: Depression, unspecified: F32.A

## 2017-08-03 HISTORY — DX: Major depressive disorder, single episode, unspecified: F32.9

## 2017-08-03 LAB — CBC
HEMATOCRIT: 40.5 % (ref 39.0–52.0)
HEMOGLOBIN: 14 g/dL (ref 13.0–17.0)
MCH: 33.5 pg (ref 26.0–34.0)
MCHC: 34.6 g/dL (ref 30.0–36.0)
MCV: 96.9 fL (ref 78.0–100.0)
Platelets: 196 10*3/uL (ref 150–400)
RBC: 4.18 MIL/uL — ABNORMAL LOW (ref 4.22–5.81)
RDW: 12.8 % (ref 11.5–15.5)
WBC: 3.5 10*3/uL — ABNORMAL LOW (ref 4.0–10.5)

## 2017-08-03 LAB — URINALYSIS, ROUTINE W REFLEX MICROSCOPIC
BACTERIA UA: NONE SEEN
BILIRUBIN URINE: NEGATIVE
Glucose, UA: NEGATIVE mg/dL
KETONES UR: NEGATIVE mg/dL
Leukocytes, UA: NEGATIVE
Nitrite: NEGATIVE
PH: 6 (ref 5.0–8.0)
PROTEIN: 100 mg/dL — AB
Specific Gravity, Urine: 1.011 (ref 1.005–1.030)

## 2017-08-03 LAB — COMPREHENSIVE METABOLIC PANEL
ALBUMIN: 3.8 g/dL (ref 3.5–5.0)
ALK PHOS: 76 U/L (ref 38–126)
ALT: 17 U/L (ref 17–63)
ANION GAP: 11 (ref 5–15)
AST: 24 U/L (ref 15–41)
BUN: 6 mg/dL (ref 6–20)
CO2: 23 mmol/L (ref 22–32)
Calcium: 8.9 mg/dL (ref 8.9–10.3)
Chloride: 106 mmol/L (ref 101–111)
Creatinine, Ser: 1.22 mg/dL (ref 0.61–1.24)
GFR calc Af Amer: >60 mL/min (ref >60)
GFR calc non Af Amer: 59 mL/min — ABNORMAL LOW (ref >60)
GLUCOSE: 107 mg/dL — AB (ref 65–99)
POTASSIUM: 3.7 mmol/L (ref 3.5–5.1)
SODIUM: 140 mmol/L (ref 135–145)
TOTAL PROTEIN: 6.9 g/dL (ref 6.5–8.1)
Total Bilirubin: 0.6 mg/dL (ref 0.3–1.2)

## 2017-08-03 LAB — SAMPLE TO BLOOD BANK

## 2017-08-03 LAB — I-STAT CHEM 8, ED
BUN: 5 mg/dL — ABNORMAL LOW (ref 6–20)
CHLORIDE: 107 mmol/L (ref 101–111)
Calcium, Ion: 1.03 mmol/L — ABNORMAL LOW (ref 1.15–1.40)
Creatinine, Ser: 1.2 mg/dL (ref 0.61–1.24)
Glucose, Bld: 108 mg/dL — ABNORMAL HIGH (ref 65–99)
HEMATOCRIT: 42 % (ref 39.0–52.0)
Hemoglobin: 14.3 g/dL (ref 13.0–17.0)
Potassium: 3.7 mmol/L (ref 3.5–5.1)
SODIUM: 143 mmol/L (ref 135–145)
TCO2: 21 mmol/L — AB (ref 22–32)

## 2017-08-03 LAB — I-STAT CG4 LACTIC ACID, ED: LACTIC ACID, VENOUS: 2.84 mmol/L — AB (ref 0.5–1.9)

## 2017-08-03 LAB — ETHANOL: Alcohol, Ethyl (B): 70 mg/dL — ABNORMAL HIGH (ref <10)

## 2017-08-03 LAB — PROTIME-INR
INR: 1.06
PROTHROMBIN TIME: 13.7 s (ref 11.4–15.2)

## 2017-08-03 LAB — CDS SEROLOGY

## 2017-08-03 MED ORDER — SODIUM CHLORIDE 0.9 % IV BOLUS
1000.0000 mL | Freq: Once | INTRAVENOUS | Status: AC
  Administered 2017-08-03: 1000 mL via INTRAVENOUS

## 2017-08-03 MED ORDER — TETANUS-DIPHTH-ACELL PERTUSSIS 5-2.5-18.5 LF-MCG/0.5 IM SUSP
0.5000 mL | Freq: Once | INTRAMUSCULAR | Status: AC
  Administered 2017-08-03: 0.5 mL via INTRAMUSCULAR
  Filled 2017-08-03: qty 0.5

## 2017-08-03 MED ORDER — FENTANYL CITRATE (PF) 100 MCG/2ML IJ SOLN
50.0000 ug | Freq: Once | INTRAMUSCULAR | Status: AC
  Administered 2017-08-03: 50 ug via INTRAVENOUS
  Filled 2017-08-03: qty 2

## 2017-08-03 MED ORDER — SODIUM CHLORIDE 0.9 % IV SOLN
INTRAVENOUS | Status: DC
  Administered 2017-08-03: 23:00:00 via INTRAVENOUS

## 2017-08-03 MED ORDER — IOPAMIDOL (ISOVUE-300) INJECTION 61%
100.0000 mL | Freq: Once | INTRAVENOUS | Status: AC | PRN
  Administered 2017-08-03: 100 mL via INTRAVENOUS

## 2017-08-03 NOTE — ED Notes (Signed)
Family came to nurses station requesting pain medication for pt. MD made aware.
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
## 2017-08-03 NOTE — ED Notes (Signed)
Pt returned from CT °
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
## 2017-08-04 ENCOUNTER — Other Ambulatory Visit: Payer: Self-pay

## 2017-08-04 ENCOUNTER — Encounter (HOSPITAL_COMMUNITY): Payer: Self-pay | Admitting: General Practice

## 2017-08-04 DIAGNOSIS — S301XXA Contusion of abdominal wall, initial encounter: Secondary | ICD-10-CM | POA: Diagnosis not present

## 2017-08-04 LAB — CBC
HCT: 35.2 % — ABNORMAL LOW (ref 39.0–52.0)
HEMATOCRIT: 34.7 % — AB (ref 39.0–52.0)
HEMOGLOBIN: 11.7 g/dL — AB (ref 13.0–17.0)
Hemoglobin: 11.9 g/dL — ABNORMAL LOW (ref 13.0–17.0)
MCH: 33.5 pg (ref 26.0–34.0)
MCH: 32.5 pg (ref 26.0–34.0)
MCHC: 34.3 g/dL (ref 30.0–36.0)
MCHC: 33.2 g/dL (ref 30.0–36.0)
MCV: 97.7 fL (ref 78.0–100.0)
MCV: 97.8 fL (ref 78.0–100.0)
Platelets: 156 10*3/uL (ref 150–400)
Platelets: 160 10*3/uL (ref 150–400)
RBC: 3.55 MIL/uL — ABNORMAL LOW (ref 4.22–5.81)
RBC: 3.6 MIL/uL — ABNORMAL LOW (ref 4.22–5.81)
RDW: 13.3 % (ref 11.5–15.5)
RDW: 13 % (ref 11.5–15.5)
WBC: 4.1 10*3/uL (ref 4.0–10.5)
WBC: 4 10*3/uL (ref 4.0–10.5)

## 2017-08-04 LAB — BASIC METABOLIC PANEL
Anion gap: 9 (ref 5–15)
BUN: 5 mg/dL — AB (ref 6–20)
CHLORIDE: 107 mmol/L (ref 101–111)
CO2: 24 mmol/L (ref 22–32)
CREATININE: 1 mg/dL (ref 0.61–1.24)
Calcium: 8.2 mg/dL — ABNORMAL LOW (ref 8.9–10.3)
GFR calc Af Amer: >60 mL/min (ref >60)
GFR calc non Af Amer: >60 mL/min (ref >60)
GLUCOSE: 94 mg/dL (ref 65–99)
Potassium: 3.3 mmol/L — ABNORMAL LOW (ref 3.5–5.1)
SODIUM: 140 mmol/L (ref 135–145)

## 2017-08-04 MED ORDER — ACETAMINOPHEN 325 MG PO TABS: 650.0000 mg | ORAL_TABLET | ORAL | Status: DC | PRN

## 2017-08-04 MED ORDER — DOCUSATE SODIUM 100 MG PO CAPS
100.0000 mg | ORAL_CAPSULE | Freq: Two times a day (BID) | ORAL | Status: DC
  Administered 2017-08-04 – 2017-08-07 (×3): 100 mg via ORAL
  Filled 2017-08-04 (×7): qty 1

## 2017-08-04 MED ORDER — ACETAMINOPHEN 325 MG PO TABS
650.0000 mg | ORAL_TABLET | Freq: Four times a day (QID) | ORAL | Status: DC
  Administered 2017-08-04 – 2017-08-07 (×12): 650 mg via ORAL
  Filled 2017-08-04 (×14): qty 2

## 2017-08-04 MED ORDER — HYDRALAZINE HCL 20 MG/ML IJ SOLN: 10.0000 mg | INTRAMUSCULAR | Status: DC | PRN

## 2017-08-04 MED ORDER — KCL IN DEXTROSE-NACL 20-5-0.45 MEQ/L-%-% IV SOLN
INTRAVENOUS | Status: DC
  Administered 2017-08-04 – 2017-08-05 (×2): via INTRAVENOUS
  Filled 2017-08-04 (×3): qty 1000

## 2017-08-04 MED ORDER — OXYCODONE HCL 5 MG PO TABS
5.0000 mg | ORAL_TABLET | ORAL | Status: DC | PRN
  Administered 2017-08-04: 5 mg via ORAL

## 2017-08-04 MED ORDER — OXYCODONE HCL 5 MG PO TABS
10.0000 mg | ORAL_TABLET | ORAL | Status: DC | PRN
  Administered 2017-08-04: 10 mg via ORAL
  Filled 2017-08-04 (×2): qty 2

## 2017-08-04 MED ORDER — ONDANSETRON HCL 4 MG/2ML IJ SOLN: 4.0000 mg | Freq: Four times a day (QID) | INTRAMUSCULAR | Status: DC | PRN

## 2017-08-04 MED ORDER — ONDANSETRON 4 MG PO TBDP: 4.0000 mg | ORAL_TABLET | Freq: Four times a day (QID) | ORAL | Status: DC | PRN

## 2017-08-04 MED ORDER — MORPHINE SULFATE (PF) 4 MG/ML IV SOLN: 1.0000 mg | INTRAVENOUS | Status: DC | PRN

## 2017-08-04 NOTE — Progress Notes (Signed)
Phoned for report at another # given by [phone] EM Pod D, again no answer at number. Awting call back

## 2017-08-04 NOTE — ED Notes (Signed)
6N nurses still in report, call back number left with unit.
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
## 2017-08-04 NOTE — ED Notes (Signed)
Accepting nurse to call back for report 

## 2017-08-04 NOTE — Progress Notes (Signed)
Called number back left by Selena Battenharlotte Solomon, RN, no answer.

## 2017-08-04 NOTE — ED Notes (Signed)
Suzanne, RN accepts report 
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
## 2017-08-05 DIAGNOSIS — S301XXA Contusion of abdominal wall, initial encounter: Secondary | ICD-10-CM | POA: Diagnosis not present

## 2017-08-05 LAB — CBC
HCT: 31.7 % — ABNORMAL LOW (ref 39.0–52.0)
HEMOGLOBIN: 11 g/dL — AB (ref 13.0–17.0)
MCH: 34.1 pg — ABNORMAL HIGH (ref 26.0–34.0)
MCHC: 34.7 g/dL (ref 30.0–36.0)
MCV: 98.1 fL (ref 78.0–100.0)
Platelets: 155 10*3/uL (ref 150–400)
RBC: 3.23 MIL/uL — AB (ref 4.22–5.81)
RDW: 13.1 % (ref 11.5–15.5)
WBC: 4.2 10*3/uL (ref 4.0–10.5)

## 2017-08-05 MED ORDER — OXYCODONE HCL 5 MG PO TABS
5.0000 mg | ORAL_TABLET | ORAL | Status: DC | PRN
  Administered 2017-08-05 – 2017-08-06 (×3): 5 mg via ORAL
  Filled 2017-08-05 (×3): qty 1

## 2017-08-05 MED ORDER — TRAMADOL HCL 50 MG PO TABS
50.0000 mg | ORAL_TABLET | Freq: Four times a day (QID) | ORAL | Status: DC | PRN
  Filled 2017-08-05: qty 1

## 2017-08-05 NOTE — Care Management Obs Status (Signed)
MEDICARE OBSERVATION STATUS NOTIFICATION   Patient Details  Name: Jeremiah OxfordDexter T Saenz MRN: [id number] Date of Birth: 10/16/1950   Medicare Observation Status Notification Given:  Yes    Glennon Macmerson, [place]roe Vogan M, RN 08/05/2017, 2:24 PM
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
## 2017-08-05 NOTE — Plan of Care (Signed)
  Problem: Activity: Goal: Risk for activity intolerance will decrease Outcome: Progressing   Problem: Pain Managment: Goal: General experience of comfort will improve Outcome: Progressing   

## 2017-08-05 NOTE — Care Management CC44 (Signed)
Condition Code 44 Documentation Completed  Patient Details  Name: Jeremiah West MRN: [id number] Date of Birth: 1950/09/09   Condition Code 44 given:  Yes Patient signature on Condition Code 44 notice:  Yes Documentation of 2 MD's agreement:  Yes Code 44 added to claim:  Yes    Glennon Macmerson, Meghann [place] M, RN 08/05/2017, 2:24 PM

## 2017-08-05 NOTE — Progress Notes (Signed)
PT Cancellation Note  Patient Details Name: Jeremiah West MRN: [id number] DOB: 1951-04-02   Cancelled Treatment:    Reason Eval/Treat Not Completed: Pain limiting ability to participate.  Attempted to see pt at 1010 and at 1343.  Pt reporting pain at 10/10 both attempts and declined all mobility/exercise.  RN aware and in to provide medications on second attempt.  Will f/u later today as able, or tomorrow.    Estill Doomsaitlin Lettie Czarnecki, PT, DPT  08/05/2017, 1:43 PM
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
## 2017-08-05 NOTE — Social Work (Addendum)
CSW completed SBIRT screening. Low Risk. No interventions needed as patient denied any issues or concerns and scored low risk factors.  Keene BreathPatricia Amad Mau, LCSW Clinical Social Worker [phone]
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
## 2017-08-06 DIAGNOSIS — S301XXA Contusion of abdominal wall, initial encounter: Secondary | ICD-10-CM | POA: Diagnosis not present

## 2017-08-06 LAB — BASIC METABOLIC PANEL
ANION GAP: 5 (ref 5–15)
BUN: 5 mg/dL — AB (ref 6–20)
CO2: 27 mmol/L (ref 22–32)
Calcium: 8.6 mg/dL — ABNORMAL LOW (ref 8.9–10.3)
Chloride: 105 mmol/L (ref 101–111)
Creatinine, Ser: 1.01 mg/dL (ref 0.61–1.24)
Glucose, Bld: 105 mg/dL — ABNORMAL HIGH (ref 65–99)
POTASSIUM: 3.7 mmol/L (ref 3.5–5.1)
Sodium: 137 mmol/L (ref 135–145)

## 2017-08-06 LAB — CBC
HEMATOCRIT: 33.6 % — AB (ref 39.0–52.0)
HEMOGLOBIN: 11.5 g/dL — AB (ref 13.0–17.0)
MCH: 33.4 pg (ref 26.0–34.0)
MCHC: 34.2 g/dL (ref 30.0–36.0)
MCV: 97.7 fL (ref 78.0–100.0)
Platelets: 151 10*3/uL (ref 150–400)
RBC: 3.44 MIL/uL — AB (ref 4.22–5.81)
RDW: 12.9 % (ref 11.5–15.5)
WBC: 4 10*3/uL (ref 4.0–10.5)

## 2017-08-06 MED ORDER — OXYCODONE HCL 5 MG PO TABS: 5.0000 mg | ORAL_TABLET | ORAL | 0 refills | Status: DC | PRN

## 2017-08-06 MED ORDER — DOCUSATE SODIUM 100 MG PO CAPS: 100.0000 mg | ORAL_CAPSULE | Freq: Two times a day (BID) | ORAL | 0 refills | Status: DC

## 2017-08-06 MED ORDER — ACETAMINOPHEN 325 MG PO TABS: 650.0000 mg | ORAL_TABLET | Freq: Four times a day (QID) | ORAL | Status: DC
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
## 2017-08-06 NOTE — Clinical Social Work Note (Signed)
Clinical Social Worker continuing to follow patient and family for support and discharge planning needs.  CSW provided patient and friend with available bed offers and they have chosen [facility] - facility aware and has initiated English as a second language teacherinsurance authorization.  CSW to follow up and facilitate patient discharge needs.  Macario GoldsJesse Jullia Mulligan, [place] [phone]
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
## 2017-08-06 NOTE — Plan of Care (Signed)
  Problem: Health Behavior/Discharge Planning: Goal: Ability to manage health-related needs will improve Outcome: Progressing   Problem: Clinical Measurements: Goal: Will remain free from infection Outcome: Progressing   

## 2017-08-06 NOTE — Discharge Instructions (Signed)
Transverse Process Fracture Each bone of the spine (vertebra) has portions of bone that extend off to either side of the spine. These portions of bone are called transverse processes. A transverse process fracture, which is also called a rotation spine fracture, is a break in a transverse process. What are the causes? This condition may be caused by:  A fall from a height.  A car accident.  A sports injury.  A gunshot wound.  A hard, direct hit to the back.  This kind of fracture often results from a sudden and severe bending of the spine to one side. What increases the risk? This condition is more likely to develop in:  People who have thinning and loss of density in the bones (osteoporosis).  People who play a contact sport.  What are the signs or symptoms? The main symptom of this condition is back pain. The pain may be felt on the side of the spine (flank) where the fracture is. It may get worse when you move or take deep a deep breath. How is this diagnosed? This condition may be diagnosed based on symptoms, a medical history, and a physical exam. During the physical exam, your health care provider may tap along the length of your spine to see where you feel pain. Imaging tests may be done to confirm the diagnosis. They may include:  X-rays.  A CT scan.  MRI.  How is this treated? Most transverse process fractures heal on their own with time and with rest. Treatment may involve supportive care, such as:  A back brace.  Activity limits.  Pain medicine.  Muscle-relaxing medicine.  Physical therapy.  Follow these instructions at home: General instructions  Take medicines only as directed by your health care provider.  Do not drive or operate heavy machinery while taking pain medicine.  Wear your neck or back brace as directed by your health care provider.  Keep all follow-up visits as directed by your health care provider. This is important. It can help to  prevent permanent injury, disability, and long-lasting (chronic) pain. Activity  Stay in bed (on bed rest) only as directed by your health care provider. Being on bed rest for too long can make your condition worse.  Return to your normal activities when your health care provider says it is okay. Ask if there are any activities that you should not do.  Do your physical therapy as recommended by your health care provider. Contact a health care provider if:  You have a fever.  You develop a cough that makes your pain worse.  Your pain medicine is not helping.  Your pain does not get better over time.  You cannot return to your normal activities as planned or expected. Get help right away if:  Your pain is very bad and it suddenly gets worse.  You are unable to move any body part (paralysis) that is below the level of your injury.  You have numbness, tingling, or weakness in any body part that is below the level of your injury.  You cannot control your bladder or bowels. This information is not intended to replace advice given to you by your health care provider. Make sure you discuss any questions you have with your health care provider. Document Released: 07/23/2006 Document Revised: 12/09/2015 Document Reviewed: 04/11/2014 Elsevier Interactive Patient Education  [employer]2018 [employer] Inc.

## 2017-08-07 DIAGNOSIS — S301XXA Contusion of abdominal wall, initial encounter: Secondary | ICD-10-CM | POA: Diagnosis not present

## 2017-08-07 NOTE — Progress Notes (Signed)
1230 [facility]. Discharge paperwork sent with [facility]. Report called to Burnett HarryShelly, Charity fundraiserN [facility].

## 2017-08-07 NOTE — Clinical Social Work Placement (Addendum)
   CLINICAL SOCIAL WORK PLACEMENT  NOTE [facility]  Date:  08/07/2017  Patient Details  Name: Jeremiah West MRN: [id number] Date of Birth: [birth year]  Clinical Social Work is seeking post-discharge placement for this patient at the Skilled  Nursing Facility level of care (*CSW will initial, date and re-position this form in  chart as items are completed):  Yes   Patient/family provided with [facility] Clinical Social Work Department's list of facilities offering this level of care within the geographic area requested by the patient (or if unable, by the patient's family).  Yes   Patient/family informed of their freedom to choose among providers that offer the needed level of care, that participate in Medicare, Medicaid or managed care program needed by the patient, have an available bed and are willing to accept the patient.  Yes   Patient/family informed of [facility]'s ownership interest in [facility] and [facility], as well as of the fact that they are under no obligation to receive care at these facilities.  PASRR submitted to EDS on       PASRR number received on       Existing PASRR number confirmed on 08/05/17     FL2 transmitted to all facilities in geographic area requested by pt/family on 08/05/17    FL2 transmitted to all facilities within larger geographic area on       Patient informed that his/her managed care company has contracts with or will negotiate with certain facilities, including the following:        Yes   Patient/family informed of bed offers received.  [facility]     Physician recommends and patient chooses bed at      [facility] 08[facility].  Patient to be transferred to facility by [facility]     Patient family notified on 08/07/17 of transfer.  Name of family member notified:  pt responsible for self      PHYSICIAN       Additional Comment:    _______________________________________________ Doy HutchingIsabel H Eytan Carrigan, LCSWA 08/07/2017, 11:12 AM
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
## 2017-08-10 ENCOUNTER — Encounter (HOSPITAL_COMMUNITY): Payer: Self-pay

## 2018-02-16 ENCOUNTER — Emergency Department (HOSPITAL_COMMUNITY): Payer: Medicare Other

## 2018-02-16 ENCOUNTER — Inpatient Hospital Stay (HOSPITAL_COMMUNITY)
Admission: EM | Admit: 2018-02-16 | Discharge: 2018-02-18 | DRG: 185 | Disposition: A | Discharge status: Home / Self Care | Payer: Medicare Other | Attending: General Surgery | Admitting: General Surgery

## 2018-02-16 ENCOUNTER — Other Ambulatory Visit: Payer: Self-pay

## 2018-02-16 ENCOUNTER — Encounter (HOSPITAL_COMMUNITY): Payer: Self-pay | Admitting: Emergency Medicine

## 2018-02-16 DIAGNOSIS — H5461 Unqualified visual loss, right eye, normal vision left eye: Secondary | ICD-10-CM | POA: Diagnosis present

## 2018-02-16 DIAGNOSIS — Z79899 Other long term (current) drug therapy: Secondary | ICD-10-CM | POA: Diagnosis not present

## 2018-02-16 DIAGNOSIS — F17210 Nicotine dependence, cigarettes, uncomplicated: Secondary | ICD-10-CM | POA: Diagnosis present

## 2018-02-16 DIAGNOSIS — Y9355 Activity, bike riding: Secondary | ICD-10-CM | POA: Diagnosis not present

## 2018-02-16 DIAGNOSIS — Z5320 Procedure and treatment not carried out because of patient's decision for unspecified reasons: Secondary | ICD-10-CM | POA: Diagnosis not present

## 2018-02-16 DIAGNOSIS — E119 Type 2 diabetes mellitus without complications: Secondary | ICD-10-CM | POA: Diagnosis present

## 2018-02-16 DIAGNOSIS — S2242XA Multiple fractures of ribs, left side, initial encounter for closed fracture: Secondary | ICD-10-CM | POA: Diagnosis present

## 2018-02-16 DIAGNOSIS — S2249XA Multiple fractures of ribs, unspecified side, initial encounter for closed fracture: Secondary | ICD-10-CM | POA: Diagnosis present

## 2018-02-16 LAB — URINALYSIS, ROUTINE W REFLEX MICROSCOPIC
BILIRUBIN URINE: NEGATIVE
Bacteria, UA: NONE SEEN
Glucose, UA: 50 mg/dL — AB
Ketones, ur: NEGATIVE mg/dL
LEUKOCYTES UA: NEGATIVE
NITRITE: NEGATIVE
PH: 6 (ref 5.0–8.0)
Protein, ur: 30 mg/dL — AB
SPECIFIC GRAVITY, URINE: 1.029 (ref 1.005–1.030)

## 2018-02-16 LAB — CBC WITH DIFFERENTIAL/PLATELET
ABS IMMATURE GRANULOCYTES: 0.02 10*3/uL (ref 0.00–0.07)
BASOS PCT: 0 %
Basophils Absolute: 0 10*3/uL (ref 0.0–0.1)
Eosinophils Absolute: 0.1 10*3/uL (ref 0.0–0.5)
Eosinophils Relative: 2 %
HCT: 45.9 % (ref 39.0–52.0)
Hemoglobin: 14.8 g/dL (ref 13.0–17.0)
Immature Granulocytes: 0 %
Lymphocytes Relative: 26 %
Lymphs Abs: 1.2 10*3/uL (ref 0.7–4.0)
MCH: 33.6 pg (ref 26.0–34.0)
MCHC: 32.2 g/dL (ref 30.0–36.0)
MCV: 104.1 fL — AB (ref 80.0–100.0)
MONOS PCT: 7 %
Monocytes Absolute: 0.3 10*3/uL (ref 0.1–1.0)
NEUTROS PCT: 65 %
Neutro Abs: 2.9 10*3/uL (ref 1.7–7.7)
Platelets: 190 10*3/uL (ref 150–400)
RBC: 4.41 MIL/uL (ref 4.22–5.81)
RDW: 12.4 % (ref 11.5–15.5)
WBC: 4.5 10*3/uL (ref 4.0–10.5)
nRBC: 0 % (ref 0.0–0.2)

## 2018-02-16 LAB — POCT I-STAT TROPONIN I: Troponin i, poc: 0.01 ng/mL (ref 0.00–0.08)

## 2018-02-16 LAB — COMPREHENSIVE METABOLIC PANEL
ALT: 20 U/L (ref 0–44)
AST: 26 U/L (ref 15–41)
Albumin: 3.9 g/dL (ref 3.5–5.0)
Alkaline Phosphatase: 89 U/L (ref 38–126)
Anion gap: 7 (ref 5–15)
BILIRUBIN TOTAL: 0.8 mg/dL (ref 0.3–1.2)
BUN: 11 mg/dL (ref 8–23)
CALCIUM: 9.2 mg/dL (ref 8.9–10.3)
CO2: 30 mmol/L (ref 22–32)
CREATININE: 1.2 mg/dL (ref 0.61–1.24)
Chloride: 103 mmol/L (ref 98–111)
GFR, EST NON AFRICAN AMERICAN: 60 mL/min — AB (ref >60)
Glucose, Bld: 153 mg/dL — ABNORMAL HIGH (ref 70–99)
Potassium: 3.8 mmol/L (ref 3.5–5.1)
Sodium: 140 mmol/L (ref 135–145)
Total Protein: 7.5 g/dL (ref 6.5–8.1)

## 2018-02-16 MED ORDER — SODIUM CHLORIDE 0.9 % IJ SOLN
INTRAMUSCULAR | Status: AC
  Filled 2018-02-16: qty 50

## 2018-02-16 MED ORDER — OXYCODONE-ACETAMINOPHEN 5-325 MG PO TABS
1.0000 | ORAL_TABLET | ORAL | Status: AC | PRN
  Administered 2018-02-16 – 2018-02-17 (×2): 1 via ORAL
  Filled 2018-02-16 (×2): qty 1

## 2018-02-16 MED ORDER — IOPAMIDOL (ISOVUE-300) INJECTION 61%
100.0000 mL | Freq: Once | INTRAVENOUS | Status: AC | PRN
  Administered 2018-02-16: 100 mL via INTRAVENOUS

## 2018-02-16 MED ORDER — MORPHINE SULFATE (PF) 4 MG/ML IV SOLN
4.0000 mg | Freq: Once | INTRAVENOUS | Status: AC
  Administered 2018-02-16: 4 mg via INTRAVENOUS
  Filled 2018-02-16: qty 1

## 2018-02-16 MED ORDER — IOPAMIDOL (ISOVUE-300) INJECTION 61%
INTRAVENOUS | Status: AC
  Filled 2018-02-16: qty 100

## 2018-02-16 MED ORDER — HYDROMORPHONE HCL 1 MG/ML IJ SOLN
1.0000 mg | Freq: Once | INTRAMUSCULAR | Status: AC
  Administered 2018-02-16: 1 mg via INTRAVENOUS
  Filled 2018-02-16: qty 1

## 2018-02-16 MED ORDER — SODIUM CHLORIDE 0.9 % IV BOLUS
1000.0000 mL | Freq: Once | INTRAVENOUS | Status: AC
  Administered 2018-02-16: 1000 mL via INTRAVENOUS
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
## 2018-02-16 NOTE — ED Notes (Signed)
Pt in Ct  
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
## 2018-02-17 ENCOUNTER — Inpatient Hospital Stay (HOSPITAL_COMMUNITY): Payer: Medicare Other

## 2018-02-17 ENCOUNTER — Other Ambulatory Visit: Payer: Self-pay

## 2018-02-17 LAB — CBC
HEMATOCRIT: 38 % — AB (ref 39.0–52.0)
HEMOGLOBIN: 12.8 g/dL — AB (ref 13.0–17.0)
MCH: 33.8 pg (ref 26.0–34.0)
MCHC: 33.7 g/dL (ref 30.0–36.0)
MCV: 100.3 fL — ABNORMAL HIGH (ref 80.0–100.0)
Platelets: 159 10*3/uL (ref 150–400)
RBC: 3.79 MIL/uL — AB (ref 4.22–5.81)
RDW: 12.2 % (ref 11.5–15.5)
WBC: 4.3 10*3/uL (ref 4.0–10.5)
nRBC: 0 % (ref 0.0–0.2)

## 2018-02-17 LAB — CREATININE, SERUM
CREATININE: 0.95 mg/dL (ref 0.61–1.24)
GFR calc Af Amer: >60 mL/min (ref >60)
GFR calc non Af Amer: >60 mL/min (ref >60)

## 2018-02-17 LAB — HIV ANTIBODY (ROUTINE TESTING W REFLEX): HIV SCREEN 4TH GENERATION: NONREACTIVE

## 2018-02-17 MED ORDER — HYDROMORPHONE HCL 1 MG/ML IJ SOLN
1.0000 mg | Freq: Once | INTRAMUSCULAR | Status: AC
  Administered 2018-02-17: 1 mg via INTRAVENOUS
  Filled 2018-02-17: qty 1

## 2018-02-17 MED ORDER — METHOCARBAMOL 500 MG PO TABS
500.0000 mg | ORAL_TABLET | Freq: Three times a day (TID) | ORAL | Status: DC
  Administered 2018-02-17 (×3): 500 mg via ORAL
  Filled 2018-02-17 (×3): qty 1

## 2018-02-17 MED ORDER — MELOXICAM 7.5 MG PO TABS
7.5000 mg | ORAL_TABLET | Freq: Every day | ORAL | Status: DC | PRN
  Administered 2018-02-17: 7.5 mg via ORAL
  Filled 2018-02-17: qty 1

## 2018-02-17 MED ORDER — OXYCODONE HCL 5 MG PO TABS
10.0000 mg | ORAL_TABLET | ORAL | Status: DC | PRN
  Administered 2018-02-17 – 2018-02-18 (×6): 10 mg via ORAL
  Filled 2018-02-17 (×6): qty 2

## 2018-02-17 MED ORDER — DOCUSATE SODIUM 100 MG PO CAPS
100.0000 mg | ORAL_CAPSULE | Freq: Two times a day (BID) | ORAL | Status: DC
  Administered 2018-02-17 – 2018-02-18 (×2): 100 mg via ORAL
  Filled 2018-02-17 (×3): qty 1

## 2018-02-17 MED ORDER — MORPHINE SULFATE (PF) 2 MG/ML IV SOLN: 2.0000 mg | INTRAVENOUS | Status: DC | PRN

## 2018-02-17 MED ORDER — OXYCODONE HCL 5 MG PO TABS: 5.0000 mg | ORAL_TABLET | ORAL | Status: DC | PRN

## 2018-02-17 MED ORDER — ONDANSETRON 4 MG PO TBDP
4.0000 mg | ORAL_TABLET | Freq: Four times a day (QID) | ORAL | Status: DC | PRN
  Administered 2018-02-17: 4 mg via ORAL
  Filled 2018-02-17: qty 1

## 2018-02-17 MED ORDER — ONDANSETRON HCL 4 MG/2ML IJ SOLN: 4.0000 mg | Freq: Four times a day (QID) | INTRAMUSCULAR | Status: DC | PRN

## 2018-02-17 MED ORDER — ENOXAPARIN SODIUM 40 MG/0.4ML ~~LOC~~ SOLN
40.0000 mg | Freq: Every day | SUBCUTANEOUS | Status: DC
  Administered 2018-02-17: 40 mg via SUBCUTANEOUS
  Filled 2018-02-17 (×2): qty 0.4

## 2018-02-17 MED ORDER — LACTATED RINGERS IV SOLN
INTRAVENOUS | Status: DC
  Administered 2018-02-17 – 2018-02-18 (×2): via INTRAVENOUS

## 2018-02-17 MED ORDER — ACETAMINOPHEN 500 MG PO TABS
1000.0000 mg | ORAL_TABLET | Freq: Four times a day (QID) | ORAL | Status: DC
  Administered 2018-02-17 – 2018-02-18 (×5): 1000 mg via ORAL
  Filled 2018-02-17 (×5): qty 2
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
## 2018-02-17 NOTE — Progress Notes (Signed)
PT Cancellation Note  Patient Details Name: Jeremiah West MRN: [id number] DOB: 1950/07/31   Cancelled Treatment:    Reason Eval/Treat Not Completed: Pain limiting ability to participate.  Pt reports 10/10 pain, per RN not able to receive any more pain medication at this time.  Will f/u as schedule allows.    Stephania Fragmin 02/17/2018, 10:49 AM

## 2018-02-17 NOTE — Progress Notes (Signed)
I paged Surgery on call that the pt is here.
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
## 2018-02-18 ENCOUNTER — Other Ambulatory Visit: Payer: Self-pay

## 2018-02-18 MED ORDER — ACETAMINOPHEN 325 MG PO TABS: 1000.0000 mg | ORAL_TABLET | Freq: Four times a day (QID) | ORAL | Status: DC | PRN

## 2018-02-18 MED ORDER — METHOCARBAMOL 750 MG PO TABS: 750.0000 mg | ORAL_TABLET | Freq: Three times a day (TID) | ORAL | 0 refills | Status: DC | PRN

## 2018-02-18 MED ORDER — METHOCARBAMOL 750 MG PO TABS
750.0000 mg | ORAL_TABLET | Freq: Three times a day (TID) | ORAL | Status: DC
  Administered 2018-02-18: 750 mg via ORAL
  Filled 2018-02-18: qty 1

## 2018-02-18 MED ORDER — OXYCODONE HCL 5 MG PO TABS: 5.0000 mg | ORAL_TABLET | ORAL | 0 refills | Status: DC | PRN
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
## 2018-02-18 NOTE — Discharge Instructions (Signed)
You should schedule an appointment with a primary care provider in 1-2 weeks for continued pain assessment/managment. If you need help finding a provider you can call the [facility] number [phone]. You can also go to an urgent care if needed.   Rib Fracture A rib fracture is a break or crack in one of the bones of the ribs. The ribs are like a cage that goes around your upper chest. A broken or cracked rib is often painful, but most do not cause other problems. Most rib fractures heal on their own in 1-3 months. Follow these instructions at home:  Avoid activities that cause pain to the injured area. Protect your injured area.  Slowly increase activity as told by your doctor.  Take medicine as told by your doctor.  Put ice on the injured area for the first 1-2 days after you have been treated or as told by your doctor. ? Put ice in a plastic bag. ? Place a towel between your skin and the bag. ? Leave the ice on for 15-20 minutes at a time, every 2 hours while you are awake.  Do deep breathing as told by your doctor. You may be told to: ? Take deep breaths many times a day. ? Cough many times a day while hugging a pillow. ? Use a device (incentive spirometer) to perform deep breathing many times a day.  Drink enough fluids to keep your pee (urine) clear or pale yellow.  Do not wear a rib belt or binder. These do not allow you to breathe deeply. Get help right away if:  You have a fever.  You have trouble breathing.  You cannot stop coughing.  You cough up thick or bloody spit (mucus).  You feel sick to your stomach (nauseous), throw up (vomit), or have belly (abdominal) pain.  Your pain gets worse and medicine does not help. This information is not intended to replace advice given to you by your health care provider. Make sure you discuss any questions you have with your health care provider. Document Released: 01/15/2008 Document Revised: 09/13/2015 Document  Reviewed: 06/09/2012 Elsevier Interactive Patient Education  2018 [employer].  Incentive Spirometer An incentive spirometer is a tool that measures how well you are filling your lungs with each breath. This tool can help keep your lungs clear and active. Taking long, deep breaths may help reverse or decrease the chance of developing breathing (pulmonary) problems, especially infection, following:  Surgery of the chest or abdomen.  Surgery if you have a history of smoking or a lung problem.  A long period of time when you are unable to move or be active.  If the spirometer includes an indicator to show your best effort, your health care provider or respiratory therapist will help you set a goal. Keep a log of your progress if directed by your health care provider. What are the risks?  Breathing too quickly may cause dizziness or cause you to pass out. Take your time so you do not get dizzy or lightheaded.  If you are in pain, you may need to take or ask for pain medicine before doing incentive spirometry. It is harder to take a deep breath if you are having pain. How to use your incentive spirometer 1. Sit on the edge of your bed if possible, or sit up as far as you can in bed or on a chair. 2. Hold the incentive spirometer in an upright position. 3. Breathe out normally. 4.  Place the mouthpiece in your mouth and seal your lips tightly around it. 5. Breathe in slowly and as deeply as possible, raising the piston or the ball toward the top of the column. 6. Hold your breath for 3-5 seconds or for as long as possible. Allow the piston or ball to fall to the bottom of the column. 7. Remove the mouthpiece from your mouth and breathe out normally. 8. The spirometer may include an indicator to show your best effort. Use the indicator as a goal to work toward during each repetition. 9. Rest for a few seconds and repeat this at least 10 times, every 1-2 hours when you are awake. Take your time  and take a few normal breaths between deep breaths. Breathing too quickly may cause dizziness or cause you to pass out. Take your time so you do not get dizzy or lightheaded. 10. After each set of 10 deep breaths, practice coughing to be sure your lungs are clear. If you had a surgical cut (incision) made during surgery, support your incision when coughing by placing a pillow or rolled-up towel firmly against it. Once you are able to get out of bed, walk around indoors and cough well. You may stop using the incentive spirometer when instructed by your health care provider. Contact a health care provider if:  You are having difficulty using the spirometer.  You have trouble using the spirometer as often as instructed.  Your pain medicine is not giving enough relief while using the spirometer.  You have a fever.  You develop shortness of breath. Get help right away if:  You develop a cough with bloody sputum.  You develop worsening pain, redness, or discharge at or near the incision site. This information is not intended to replace advice given to you by your health care provider. Make sure you discuss any questions you have with your health care provider. Document Released: 08/18/2006 Document Revised: 12/31/2015 Document Reviewed: 11/14/2013 Elsevier Interactive Patient Education  [employer].
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
## 2018-11-03 ENCOUNTER — Other Ambulatory Visit: Payer: Self-pay

## 2018-11-03 ENCOUNTER — Emergency Department (HOSPITAL_COMMUNITY): Payer: Medicare Other

## 2018-11-03 ENCOUNTER — Emergency Department (HOSPITAL_COMMUNITY)
Admission: EM | Admit: 2018-11-03 | Discharge: 2018-11-03 | Disposition: A | Discharge status: Home / Self Care | Payer: Medicare Other | Attending: Emergency Medicine | Admitting: Emergency Medicine

## 2018-11-03 ENCOUNTER — Encounter (HOSPITAL_COMMUNITY): Payer: Self-pay | Admitting: Emergency Medicine

## 2018-11-03 DIAGNOSIS — M25511 Pain in right shoulder: Secondary | ICD-10-CM | POA: Diagnosis not present

## 2018-11-03 DIAGNOSIS — E119 Type 2 diabetes mellitus without complications: Secondary | ICD-10-CM | POA: Diagnosis not present

## 2018-11-03 DIAGNOSIS — F17210 Nicotine dependence, cigarettes, uncomplicated: Secondary | ICD-10-CM | POA: Insufficient documentation

## 2018-11-03 LAB — BASIC METABOLIC PANEL
Anion gap: 11 (ref 5–15)
BUN: <5 mg/dL — ABNORMAL LOW (ref 8–23)
CO2: 20 mmol/L — ABNORMAL LOW (ref 22–32)
Calcium: 9.1 mg/dL (ref 8.9–10.3)
Chloride: 107 mmol/L (ref 98–111)
Creatinine, Ser: 0.94 mg/dL (ref 0.61–1.24)
GFR calc Af Amer: >60 mL/min (ref >60)
GFR calc non Af Amer: >60 mL/min (ref >60)
Glucose, Bld: 97 mg/dL (ref 70–99)
Potassium: 3.8 mmol/L (ref 3.5–5.1)
Sodium: 138 mmol/L (ref 135–145)

## 2018-11-03 LAB — CBC
HCT: 42.7 % (ref 39.0–52.0)
Hemoglobin: 14.5 g/dL (ref 13.0–17.0)
MCH: 34.4 pg — ABNORMAL HIGH (ref 26.0–34.0)
MCHC: 34 g/dL (ref 30.0–36.0)
MCV: 101.2 fL — ABNORMAL HIGH (ref 80.0–100.0)
Platelets: 188 10*3/uL (ref 150–400)
RBC: 4.22 MIL/uL (ref 4.22–5.81)
RDW: 12.6 % (ref 11.5–15.5)
WBC: 3.2 10*3/uL — ABNORMAL LOW (ref 4.0–10.5)
nRBC: 0 % (ref 0.0–0.2)

## 2018-11-03 LAB — TROPONIN I (HIGH SENSITIVITY)
Troponin I (High Sensitivity): 8 ng/L (ref <18)
Troponin I (High Sensitivity): 5 ng/L (ref <18)

## 2018-11-03 LAB — D-DIMER, QUANTITATIVE: D-Dimer, Quant: 0.39 ug/mL-FEU (ref 0.00–0.50)

## 2018-11-03 MED ORDER — ACETAMINOPHEN 325 MG PO TABS
650.0000 mg | ORAL_TABLET | Freq: Once | ORAL | Status: AC
  Administered 2018-11-03: 650 mg via ORAL
  Filled 2018-11-03: qty 2

## 2018-11-03 MED ORDER — SODIUM CHLORIDE 0.9% FLUSH
3.0000 mL | Freq: Once | INTRAVENOUS | Status: AC
  Administered 2018-11-03: 3 mL via INTRAVENOUS

## 2018-11-03 NOTE — ED Triage Notes (Signed)
pt states this morning he began to have a "sharp pain right under his right shoulder blade" pt states this is worse with a deep breath and when the pain comes he feels short of breath. Pt denies any chest pain. No cough or fevers. Pt is alert and ox4. No distress noted.
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
## 2018-11-03 NOTE — Discharge Instructions (Signed)
Please read instructions below. Apply ice to your right shoulder for 20 minutes at a time. You can take Tylenol every 4-6 hours as needed for pain. Schedule an appointment with your primary care provider to follow-up on your visit today if symptoms persist.   Return to the ER for new or concerning symptoms.

## 2018-11-03 NOTE — ED Notes (Signed)
pts phone charging at nurses station prior to discharge

## 2018-11-03 NOTE — ED Notes (Signed)
Pt c/o pain to R posterior shoulder region when pointing.  Denies any difficulty breathing, or injury.

## 2019-07-02 ENCOUNTER — Ambulatory Visit: Payer: Medicare Other | Attending: Internal Medicine

## 2019-07-02 DIAGNOSIS — Z23 Encounter for immunization: Secondary | ICD-10-CM

## 2019-07-02 NOTE — Progress Notes (Signed)
   Covid-19 Vaccination Clinic  Name:  Jeremiah West    MRN: [id number] DOB: June 09, 1950  07/02/2019  [name] was observed post Covid-19 immunization for 15 minutes without incident. He was provided with Vaccine Information Sheet and instruction to access the V-Safe system.   [name] was instructed to call 911 with any severe reactions post vaccine: Marland Kitchen Difficulty breathing  . Swelling of face and throat  . A fast heartbeat  . A bad rash all over body  . Dizziness and weakness   Immunizations Administered    Name Date Dose VIS Date Route   Pfizer COVID-19 Vaccine 07/02/2019 12:15 PM 0.3 mL 04/01/2019 Intramuscular   Manufacturer: [employer], [employer]   Lot: [id number]   NDC: [address]-8

## 2019-07-25 ENCOUNTER — Ambulatory Visit: Payer: Self-pay

## 2019-07-25 ENCOUNTER — Ambulatory Visit: Payer: Medicare Other | Attending: Internal Medicine

## 2019-07-25 DIAGNOSIS — Z23 Encounter for immunization: Secondary | ICD-10-CM

## 2019-07-25 NOTE — Progress Notes (Signed)
   Covid-19 Vaccination Clinic  Name:  Jeremiah West    MRN: [id number] DOB: Jul 23, 1950  07/25/2019  [name] was observed post Covid-19 immunization for 15 minutes without incident. He was provided with Vaccine Information Sheet and instruction to access the V-Safe system.   [name] was instructed to call 911 with any severe reactions post vaccine: Marland Kitchen Difficulty breathing  . Swelling of face and throat  . A fast heartbeat  . A bad rash all over body  . Dizziness and weakness   Immunizations Administered    Name Date Dose VIS Date Route   Pfizer COVID-19 Vaccine 07/25/2019  2:42 PM 0.3 mL 04/01/2019 Intramuscular   Manufacturer: [employer], [employer]   Lot: [id number]   NDC: [address]-5

## 2019-08-06 ENCOUNTER — Emergency Department (HOSPITAL_COMMUNITY): Payer: Medicare Other

## 2019-08-06 ENCOUNTER — Inpatient Hospital Stay (HOSPITAL_COMMUNITY)
Admission: EM | Admit: 2019-08-06 | Discharge: 2019-08-18 | DRG: 083 | Disposition: A | Discharge status: Rehabilitation Facility | Payer: Medicare Other | Attending: Physician Assistant | Admitting: Physician Assistant

## 2019-08-06 DIAGNOSIS — I619 Nontraumatic intracerebral hemorrhage, unspecified: Principal | ICD-10-CM

## 2019-08-06 DIAGNOSIS — F10920 Alcohol use, unspecified with intoxication, uncomplicated: Secondary | ICD-10-CM

## 2019-08-06 DIAGNOSIS — R079 Chest pain, unspecified: Secondary | ICD-10-CM

## 2019-08-06 DIAGNOSIS — T1490XA Injury, unspecified, initial encounter: Secondary | ICD-10-CM

## 2019-08-06 DIAGNOSIS — E440 Moderate protein-calorie malnutrition: Secondary | ICD-10-CM | POA: Insufficient documentation

## 2019-08-06 DIAGNOSIS — S0219XB Other fracture of base of skull, initial encounter for open fracture: Secondary | ICD-10-CM

## 2019-08-06 DIAGNOSIS — S069X9A Unspecified intracranial injury with loss of consciousness of unspecified duration, initial encounter: Secondary | ICD-10-CM | POA: Diagnosis present

## 2019-08-06 DIAGNOSIS — S0219XA Other fracture of base of skull, initial encounter for closed fracture: Secondary | ICD-10-CM | POA: Diagnosis not present

## 2019-08-06 DIAGNOSIS — D709 Neutropenia, unspecified: Secondary | ICD-10-CM | POA: Diagnosis not present

## 2019-08-06 DIAGNOSIS — S065X9D Traumatic subdural hemorrhage with loss of consciousness of unspecified duration, subsequent encounter: Secondary | ICD-10-CM | POA: Diagnosis not present

## 2019-08-06 DIAGNOSIS — S065X9A Traumatic subdural hemorrhage with loss of consciousness of unspecified duration, initial encounter: Secondary | ICD-10-CM | POA: Diagnosis not present

## 2019-08-06 DIAGNOSIS — S066X9A Traumatic subarachnoid hemorrhage with loss of consciousness of unspecified duration, initial encounter: Secondary | ICD-10-CM | POA: Diagnosis not present

## 2019-08-06 DIAGNOSIS — W108XXA Fall (on) (from) other stairs and steps, initial encounter: Secondary | ICD-10-CM | POA: Diagnosis present

## 2019-08-06 DIAGNOSIS — Z6824 Body mass index (BMI) 24.0-24.9, adult: Secondary | ICD-10-CM | POA: Diagnosis not present

## 2019-08-06 DIAGNOSIS — R58 Hemorrhage, not elsewhere classified: Secondary | ICD-10-CM | POA: Diagnosis not present

## 2019-08-06 DIAGNOSIS — H5461 Unqualified visual loss, right eye, normal vision left eye: Secondary | ICD-10-CM | POA: Diagnosis present

## 2019-08-06 DIAGNOSIS — S069XAA Unspecified intracranial injury with loss of consciousness status unknown, initial encounter: Secondary | ICD-10-CM | POA: Diagnosis present

## 2019-08-06 DIAGNOSIS — R0682 Tachypnea, not elsewhere classified: Secondary | ICD-10-CM

## 2019-08-06 DIAGNOSIS — R402242 Coma scale, best verbal response, confused conversation, at arrival to emergency department: Secondary | ICD-10-CM | POA: Diagnosis present

## 2019-08-06 DIAGNOSIS — E876 Hypokalemia: Secondary | ICD-10-CM | POA: Diagnosis not present

## 2019-08-06 DIAGNOSIS — I517 Cardiomegaly: Secondary | ICD-10-CM | POA: Diagnosis present

## 2019-08-06 DIAGNOSIS — R0902 Hypoxemia: Secondary | ICD-10-CM | POA: Diagnosis not present

## 2019-08-06 DIAGNOSIS — S299XXA Unspecified injury of thorax, initial encounter: Secondary | ICD-10-CM | POA: Diagnosis not present

## 2019-08-06 DIAGNOSIS — I6200 Nontraumatic subdural hemorrhage, unspecified: Secondary | ICD-10-CM | POA: Diagnosis not present

## 2019-08-06 DIAGNOSIS — R4701 Aphasia: Secondary | ICD-10-CM | POA: Diagnosis not present

## 2019-08-06 DIAGNOSIS — S7001XA Contusion of right hip, initial encounter: Secondary | ICD-10-CM | POA: Diagnosis not present

## 2019-08-06 DIAGNOSIS — R402362 Coma scale, best motor response, obeys commands, at arrival to emergency department: Secondary | ICD-10-CM | POA: Diagnosis present

## 2019-08-06 DIAGNOSIS — N433 Hydrocele, unspecified: Secondary | ICD-10-CM | POA: Diagnosis present

## 2019-08-06 DIAGNOSIS — S065X0A Traumatic subdural hemorrhage without loss of consciousness, initial encounter: Secondary | ICD-10-CM | POA: Diagnosis not present

## 2019-08-06 DIAGNOSIS — Z781 Physical restraint status: Secondary | ICD-10-CM

## 2019-08-06 DIAGNOSIS — F10120 Alcohol abuse with intoxication, uncomplicated: Secondary | ICD-10-CM | POA: Diagnosis present

## 2019-08-06 DIAGNOSIS — S3991XA Unspecified injury of abdomen, initial encounter: Secondary | ICD-10-CM | POA: Diagnosis not present

## 2019-08-06 DIAGNOSIS — E871 Hypo-osmolality and hyponatremia: Secondary | ICD-10-CM | POA: Diagnosis not present

## 2019-08-06 DIAGNOSIS — Y907 Blood alcohol level of 200-239 mg/100 ml: Secondary | ICD-10-CM | POA: Diagnosis present

## 2019-08-06 DIAGNOSIS — R4189 Other symptoms and signs involving cognitive functions and awareness: Secondary | ICD-10-CM | POA: Diagnosis present

## 2019-08-06 DIAGNOSIS — W108XXD Fall (on) (from) other stairs and steps, subsequent encounter: Secondary | ICD-10-CM | POA: Diagnosis present

## 2019-08-06 DIAGNOSIS — Z20822 Contact with and (suspected) exposure to covid-19: Secondary | ICD-10-CM | POA: Diagnosis not present

## 2019-08-06 DIAGNOSIS — E875 Hyperkalemia: Secondary | ICD-10-CM | POA: Diagnosis not present

## 2019-08-06 DIAGNOSIS — S0219XD Other fracture of base of skull, subsequent encounter for fracture with routine healing: Secondary | ICD-10-CM | POA: Diagnosis not present

## 2019-08-06 DIAGNOSIS — S069X3S Unspecified intracranial injury with loss of consciousness of 1 hour to 5 hours 59 minutes, sequela: Secondary | ICD-10-CM | POA: Diagnosis not present

## 2019-08-06 DIAGNOSIS — F1010 Alcohol abuse, uncomplicated: Secondary | ICD-10-CM | POA: Diagnosis not present

## 2019-08-06 DIAGNOSIS — E1165 Type 2 diabetes mellitus with hyperglycemia: Secondary | ICD-10-CM | POA: Diagnosis not present

## 2019-08-06 DIAGNOSIS — R1312 Dysphagia, oropharyngeal phase: Secondary | ICD-10-CM | POA: Diagnosis not present

## 2019-08-06 DIAGNOSIS — I700 Atherosclerosis of aorta: Secondary | ICD-10-CM | POA: Diagnosis present

## 2019-08-06 DIAGNOSIS — R4587 Impulsiveness: Secondary | ICD-10-CM | POA: Diagnosis present

## 2019-08-06 DIAGNOSIS — Z8781 Personal history of (healed) traumatic fracture: Secondary | ICD-10-CM

## 2019-08-06 DIAGNOSIS — R404 Transient alteration of awareness: Secondary | ICD-10-CM | POA: Diagnosis not present

## 2019-08-06 DIAGNOSIS — J439 Emphysema, unspecified: Secondary | ICD-10-CM | POA: Diagnosis present

## 2019-08-06 DIAGNOSIS — R402132 Coma scale, eyes open, to sound, at arrival to emergency department: Secondary | ICD-10-CM | POA: Diagnosis present

## 2019-08-06 DIAGNOSIS — S066X9D Traumatic subarachnoid hemorrhage with loss of consciousness of unspecified duration, subsequent encounter: Secondary | ICD-10-CM | POA: Diagnosis not present

## 2019-08-06 DIAGNOSIS — Y92009 Unspecified place in unspecified non-institutional (private) residence as the place of occurrence of the external cause: Secondary | ICD-10-CM

## 2019-08-06 DIAGNOSIS — H02401 Unspecified ptosis of right eyelid: Secondary | ICD-10-CM | POA: Diagnosis not present

## 2019-08-06 DIAGNOSIS — Z9181 History of falling: Secondary | ICD-10-CM

## 2019-08-06 DIAGNOSIS — R451 Restlessness and agitation: Secondary | ICD-10-CM | POA: Diagnosis not present

## 2019-08-06 DIAGNOSIS — Z743 Need for continuous supervision: Secondary | ICD-10-CM | POA: Diagnosis not present

## 2019-08-06 DIAGNOSIS — I509 Heart failure, unspecified: Secondary | ICD-10-CM | POA: Diagnosis not present

## 2019-08-06 DIAGNOSIS — Z87891 Personal history of nicotine dependence: Secondary | ICD-10-CM | POA: Diagnosis not present

## 2019-08-06 DIAGNOSIS — R1310 Dysphagia, unspecified: Secondary | ICD-10-CM | POA: Diagnosis not present

## 2019-08-06 DIAGNOSIS — S199XXA Unspecified injury of neck, initial encounter: Secondary | ICD-10-CM | POA: Diagnosis not present

## 2019-08-06 DIAGNOSIS — S066X0A Traumatic subarachnoid hemorrhage without loss of consciousness, initial encounter: Secondary | ICD-10-CM | POA: Diagnosis not present

## 2019-08-06 DIAGNOSIS — Z6823 Body mass index (BMI) 23.0-23.9, adult: Secondary | ICD-10-CM | POA: Diagnosis not present

## 2019-08-06 DIAGNOSIS — S3993XA Unspecified injury of pelvis, initial encounter: Secondary | ICD-10-CM | POA: Diagnosis not present

## 2019-08-06 DIAGNOSIS — S069X6D Unspecified intracranial injury with loss of consciousness greater than 24 hours without return to pre-existing conscious level with patient surviving, subsequent encounter: Secondary | ICD-10-CM | POA: Diagnosis not present

## 2019-08-06 DIAGNOSIS — G479 Sleep disorder, unspecified: Secondary | ICD-10-CM | POA: Diagnosis not present

## 2019-08-06 DIAGNOSIS — S06369A Traumatic hemorrhage of cerebrum, unspecified, with loss of consciousness of unspecified duration, initial encounter: Secondary | ICD-10-CM | POA: Diagnosis not present

## 2019-08-06 DIAGNOSIS — S7001XD Contusion of right hip, subsequent encounter: Secondary | ICD-10-CM | POA: Diagnosis not present

## 2019-08-06 DIAGNOSIS — R739 Hyperglycemia, unspecified: Secondary | ICD-10-CM | POA: Diagnosis not present

## 2019-08-06 DIAGNOSIS — S0281XA Fracture of other specified skull and facial bones, right side, initial encounter for closed fracture: Secondary | ICD-10-CM | POA: Diagnosis not present

## 2019-08-06 DIAGNOSIS — S069X0S Unspecified intracranial injury without loss of consciousness, sequela: Secondary | ICD-10-CM | POA: Diagnosis not present

## 2019-08-06 HISTORY — DX: Other psychoactive substance abuse, uncomplicated: F19.10

## 2019-08-06 HISTORY — DX: Type 2 diabetes mellitus without complications: E11.9

## 2019-08-06 LAB — URINALYSIS, ROUTINE W REFLEX MICROSCOPIC
Bacteria, UA: NONE SEEN
Bilirubin Urine: NEGATIVE
Glucose, UA: NEGATIVE mg/dL
Ketones, ur: NEGATIVE mg/dL
Leukocytes,Ua: NEGATIVE
Nitrite: NEGATIVE
Protein, ur: NEGATIVE mg/dL
Specific Gravity, Urine: 1.011 (ref 1.005–1.030)
pH: 6 (ref 5.0–8.0)

## 2019-08-06 LAB — I-STAT CHEM 8, ED
BUN: 9 mg/dL (ref 8–23)
Calcium, Ion: 1.14 mmol/L — ABNORMAL LOW (ref 1.15–1.40)
Chloride: 101 mmol/L (ref 98–111)
Creatinine, Ser: 1.2 mg/dL (ref 0.61–1.24)
Glucose, Bld: 96 mg/dL (ref 70–99)
HCT: 58 % — ABNORMAL HIGH (ref 39.0–52.0)
Hemoglobin: 19.7 g/dL — ABNORMAL HIGH (ref 13.0–17.0)
Potassium: 4.6 mmol/L (ref 3.5–5.1)
Sodium: 139 mmol/L (ref 135–145)
TCO2: 26 mmol/L (ref 22–32)

## 2019-08-06 LAB — COMPREHENSIVE METABOLIC PANEL
ALT: 32 U/L (ref 0–44)
AST: 34 U/L (ref 15–41)
Albumin: 4.3 g/dL (ref 3.5–5.0)
Alkaline Phosphatase: 80 U/L (ref 38–126)
Anion gap: 13 (ref 5–15)
BUN: 7 mg/dL — ABNORMAL LOW (ref 8–23)
CO2: 24 mmol/L (ref 22–32)
Calcium: 9.5 mg/dL (ref 8.9–10.3)
Chloride: 101 mmol/L (ref 98–111)
Creatinine, Ser: 1.21 mg/dL (ref 0.61–1.24)
GFR calc Af Amer: >60 mL/min (ref >60)
GFR calc non Af Amer: >60 mL/min (ref >60)
Glucose, Bld: 112 mg/dL — ABNORMAL HIGH (ref 70–99)
Potassium: 3.7 mmol/L (ref 3.5–5.1)
Sodium: 138 mmol/L (ref 135–145)
Total Bilirubin: 0.4 mg/dL (ref 0.3–1.2)
Total Protein: 8.4 g/dL — ABNORMAL HIGH (ref 6.5–8.1)

## 2019-08-06 LAB — CBC
HCT: 50.9 % (ref 39.0–52.0)
Hemoglobin: 17.2 g/dL — ABNORMAL HIGH (ref 13.0–17.0)
MCH: 34.2 pg — ABNORMAL HIGH (ref 26.0–34.0)
MCHC: 33.8 g/dL (ref 30.0–36.0)
MCV: 101.2 fL — ABNORMAL HIGH (ref 80.0–100.0)
Platelets: 206 10*3/uL (ref 150–400)
RBC: 5.03 MIL/uL (ref 4.22–5.81)
RDW: 13 % (ref 11.5–15.5)
WBC: 4.1 10*3/uL (ref 4.0–10.5)
nRBC: 0 % (ref 0.0–0.2)

## 2019-08-06 LAB — PROTIME-INR
INR: 0.9 (ref 0.8–1.2)
Prothrombin Time: 12.4 seconds (ref 11.4–15.2)

## 2019-08-06 LAB — CK: Total CK: 143 U/L (ref 49–397)

## 2019-08-06 LAB — LACTIC ACID, PLASMA: Lactic Acid, Venous: 3 mmol/L (ref 0.5–1.9)

## 2019-08-06 LAB — ETHANOL: Alcohol, Ethyl (B): 221 mg/dL — ABNORMAL HIGH (ref <10)

## 2019-08-06 LAB — SAMPLE TO BLOOD BANK

## 2019-08-06 MED ORDER — PANTOPRAZOLE SODIUM 40 MG IV SOLR
40.0000 mg | Freq: Every day | INTRAVENOUS | Status: DC
  Administered 2019-08-07: 40 mg via INTRAVENOUS
  Filled 2019-08-06: qty 40

## 2019-08-06 MED ORDER — ONDANSETRON 4 MG PO TBDP: 4.0000 mg | ORAL_TABLET | Freq: Four times a day (QID) | ORAL | Status: DC | PRN

## 2019-08-06 MED ORDER — THIAMINE HCL 100 MG/ML IJ SOLN
100.0000 mg | Freq: Every day | INTRAMUSCULAR | Status: DC
  Administered 2019-08-07 – 2019-08-08 (×2): 100 mg via INTRAVENOUS
  Filled 2019-08-06 (×3): qty 2

## 2019-08-06 MED ORDER — SODIUM CHLORIDE 0.9 % IV BOLUS
1000.0000 mL | Freq: Once | INTRAVENOUS | Status: AC
  Administered 2019-08-07: 1000 mL via INTRAVENOUS

## 2019-08-06 MED ORDER — METOPROLOL TARTRATE 5 MG/5ML IV SOLN: 5.0000 mg | Freq: Four times a day (QID) | INTRAVENOUS | Status: DC | PRN

## 2019-08-06 MED ORDER — OXYCODONE HCL 5 MG PO TABS
5.0000 mg | ORAL_TABLET | ORAL | Status: DC | PRN
  Administered 2019-08-08: 5 mg via ORAL
  Filled 2019-08-06: qty 1

## 2019-08-06 MED ORDER — SODIUM CHLORIDE 0.9 % IV SOLN: INTRAVENOUS | Status: DC

## 2019-08-06 MED ORDER — THIAMINE HCL 100 MG PO TABS
100.0000 mg | ORAL_TABLET | Freq: Every day | ORAL | Status: DC
  Administered 2019-08-09: 100 mg via ORAL
  Filled 2019-08-06 (×2): qty 1

## 2019-08-06 MED ORDER — IOHEXOL 300 MG/ML  SOLN
100.0000 mL | Freq: Once | INTRAMUSCULAR | Status: AC | PRN
  Administered 2019-08-06: 100 mL via INTRAVENOUS

## 2019-08-06 MED ORDER — DOCUSATE SODIUM 100 MG PO CAPS
100.0000 mg | ORAL_CAPSULE | Freq: Two times a day (BID) | ORAL | Status: DC
  Administered 2019-08-08 – 2019-08-09 (×2): 100 mg via ORAL
  Filled 2019-08-06 (×2): qty 1

## 2019-08-06 MED ORDER — ADULT MULTIVITAMIN W/MINERALS CH
1.0000 | ORAL_TABLET | Freq: Every day | ORAL | Status: DC
  Administered 2019-08-08 – 2019-08-09 (×2): 1 via ORAL
  Filled 2019-08-06 (×2): qty 1

## 2019-08-06 MED ORDER — LORAZEPAM 1 MG PO TABS
1.0000 mg | ORAL_TABLET | ORAL | Status: AC | PRN
  Filled 2019-08-06: qty 2

## 2019-08-06 MED ORDER — HALOPERIDOL LACTATE 5 MG/ML IJ SOLN
5.0000 mg | Freq: Four times a day (QID) | INTRAMUSCULAR | Status: DC | PRN
  Administered 2019-08-07 – 2019-08-18 (×7): 5 mg via INTRAVENOUS
  Filled 2019-08-06 (×7): qty 1

## 2019-08-06 MED ORDER — LORAZEPAM 2 MG/ML IJ SOLN
1.0000 mg | INTRAMUSCULAR | Status: AC | PRN
  Administered 2019-08-06: 4 mg via INTRAVENOUS
  Administered 2019-08-07 (×4): 2 mg via INTRAVENOUS
  Administered 2019-08-09: 1 mg via INTRAVENOUS
  Filled 2019-08-06 (×6): qty 1

## 2019-08-06 MED ORDER — ACETAMINOPHEN 325 MG PO TABS: 650.0000 mg | ORAL_TABLET | ORAL | Status: DC | PRN

## 2019-08-06 MED ORDER — ONDANSETRON HCL 4 MG/2ML IJ SOLN
4.0000 mg | Freq: Four times a day (QID) | INTRAMUSCULAR | Status: DC | PRN
  Administered 2019-08-09: 4 mg via INTRAVENOUS
  Filled 2019-08-06: qty 2

## 2019-08-06 MED ORDER — HYDRALAZINE HCL 20 MG/ML IJ SOLN: 10.0000 mg | INTRAMUSCULAR | Status: DC | PRN

## 2019-08-06 MED ORDER — LEVETIRACETAM 500 MG PO TABS: 500.0000 mg | ORAL_TABLET | Freq: Two times a day (BID) | ORAL | Status: DC

## 2019-08-06 MED ORDER — LORAZEPAM 2 MG/ML IJ SOLN
INTRAMUSCULAR | Status: AC
  Filled 2019-08-06: qty 2

## 2019-08-06 MED ORDER — PANTOPRAZOLE SODIUM 40 MG PO TBEC: 40.0000 mg | DELAYED_RELEASE_TABLET | Freq: Every day | ORAL | Status: DC

## 2019-08-06 MED ORDER — FOLIC ACID 1 MG PO TABS: 1.0000 mg | ORAL_TABLET | Freq: Every day | ORAL | Status: DC

## 2019-08-06 MED ORDER — HYDROMORPHONE HCL 1 MG/ML IJ SOLN
0.5000 mg | INTRAMUSCULAR | Status: DC | PRN
  Administered 2019-08-08 – 2019-08-09 (×2): 0.5 mg via INTRAVENOUS
  Filled 2019-08-06 (×2): qty 1

## 2019-08-06 NOTE — ED Notes (Signed)
Pt crawling out of the bed not wanting to stay in bed  Bleeding from the, rt mouth laceration

## 2019-08-06 NOTE — ED Provider Notes (Signed)
[facility] EMERGENCY DEPARTMENT Provider Note   CSN: [id number] Arrival date & time: 08/06/19  2100     History No chief complaint on file.   Jeremiah West is a 69 y.o. male w/ hx of daily etoh use presenting to ED s/p fall.  Per EMS patient was found at the base of approx 16 stairs after a suspected mechanical fall.  His fiance reports that he drinks heavily daily and thought he was drinking tonight.  The patient had diminished GCS on their arrival, was placed in a C-spine collar and brought to the ED.  HPI     No past medical history on file.  Patient Active Problem List   Diagnosis Date Noted  . Traumatic brain injury (HCC) 08/06/2019      No family history on file.  Social History   Tobacco Use  . Smoking status: Not on file  Substance Use Topics  . Alcohol use: Not on file  . Drug use: Not on file    Home Medications Prior to Admission medications   Medication Sig Start Date End Date Taking? Authorizing Provider  ibuprofen (ADVIL) 200 MG tablet Take 200 mg by mouth every 6 (six) hours as needed for mild pain.   Yes [provider]  polyethylene glycol (MIRALAX / GLYCOLAX) 17 g packet Take 17 g by mouth daily as needed for mild constipation.   Yes [provider]    Allergies    Patient has no known allergies.  Review of Systems   Review of Systems  Unable to perform ROS: Mental status change (level 5 caveat)    Physical Exam Updated Vital Signs BP 126/84   Pulse (!) 105   Resp (!) 27   Ht  (1.727 m)   Wt 72.6 kg   SpO2 98%   BMI 24.33 kg/m   Physical Exam Vitals and nursing note reviewed.  Constitutional:      Appearance: He is well-developed.     Comments: Slurred speech, smells of alcohol  HENT:     Head: Normocephalic.     Comments: Blood in mouth, small stellate laceration to right lower lip not extending past vermilion border Eyes:     Conjunctiva/sclera: Conjunctivae normal.  Neck:   Comments: C spine collar in place Cardiovascular:     Rate and Rhythm: Normal rate and regular rhythm.     Pulses: Normal pulses.  Pulmonary:     Effort: Pulmonary effort is normal. No respiratory distress.  Abdominal:     Palpations: Abdomen is soft.     Tenderness: There is no abdominal tenderness.  Skin:    General: Skin is warm and dry.  Neurological:     Mental Status: He is alert.     GCS: GCS eye subscore is 3. GCS verbal subscore is 4. GCS motor subscore is 6.     ED Results / Procedures / Treatments   Labs (all labs ordered are listed, but only abnormal results are displayed) Labs Reviewed  COMPREHENSIVE METABOLIC PANEL - Abnormal; Notable for the following components:      Result Value   Glucose, Bld 112 (*)    BUN 7 (*)    Total Protein 8.4 (*)    All other components within normal limits  CBC - Abnormal; Notable for the following components:   Hemoglobin 17.2 (*)    MCV 101.2 (*)    MCH 34.2 (*)    All other components within normal limits  ETHANOL -  Abnormal; Notable for the following components:   Alcohol, Ethyl (B) 221 (*)    All other components within normal limits  URINALYSIS, ROUTINE W REFLEX MICROSCOPIC - Abnormal; Notable for the following components:   Color, Urine STRAW (*)    Hgb urine dipstick SMALL (*)    All other components within normal limits  LACTIC ACID, PLASMA - Abnormal; Notable for the following components:   Lactic Acid, Venous 3.0 (*)    All other components within normal limits  I-STAT CHEM 8, ED - Abnormal; Notable for the following components:   Calcium, Ion 1.14 (*)    Hemoglobin 19.7 (*)    HCT 58.0 (*)    All other components within normal limits  RESPIRATORY PANEL BY RT PCR (FLU A&B, COVID)  PROTIME-INR  CK  HIV ANTIBODY (ROUTINE TESTING W REFLEX)  CBC  BASIC METABOLIC PANEL  SAMPLE TO BLOOD BANK    EKG None  Radiology CT Head Wo Contrast  Result Date: 08/06/2019 CLINICAL DATA:  69 year old male with trauma.  EXAM: CT HEAD WITHOUT CONTRAST CT CERVICAL SPINE WITHOUT CONTRAST TECHNIQUE: Multidetector CT imaging of the head and cervical spine was performed following the standard protocol without intravenous contrast. Multiplanar CT image reconstructions of the cervical spine were also generated. COMPARISON:  Head CT dated 12/13/2015. FINDINGS: Evaluation of this exam is limited due to motion artifact. CT HEAD FINDINGS Brain: There is left frontal and left temporal subdural hemorrhage measuring approximately 5 mm in thickness over the left frontal lobe. Small amount of blood in the left temporal lobe (series 5 image 14) measuring approximately 6 x 12 mm may represent a small subarachnoid or intraparenchymal hemorrhage. No significant mass effect. No midline shift. There is moderate age-related atrophy and chronic microvascular ischemic changes. Vascular: No hyperdense vessel or unexpected calcification. Skull: There is a nondisplaced fracture of the right temporal calvarium extending into the right mastoid air cells. CT of the temporal bone may provide better evaluation. Sinuses/Orbits: The visualized paranasal sinuses and the left mastoid air cells are clear. There is partial opacification of the right mastoid air cells. There is soft tissue attenuating content within the right external auditory canal, likely broad products. Right globe prosthesis. Other: None CT CERVICAL SPINE FINDINGS Alignment: No acute subluxation. Skull base and vertebrae: No acute fracture. Osteopenia. Soft tissues and spinal canal: No prevertebral fluid or swelling. No visible canal hematoma. Disc levels: Multilevel degenerative changes with endplate irregularity and disc space narrowing. Upper chest: Negative. Other: None IMPRESSION: 1. 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 A small left temporal subarachnoid hemorrhage may also be present. No significant mass effect or midline shift. 2. Nondisplaced fracture of the right temporal  calvarium extending into the right mastoid air cells. CT of the temporal bone may provide better evaluation. 3. Right mastoid effusion/hemorrhage with probable blood product within the right external auditory canal. 4. No acute/traumatic cervical spine pathology. These results were called by telephone at the time of interpretation on 08/06/2019 at 9:54 pm to provider Janaria Mccammon , who verbally acknowledged these results. Electronically Signed   By: Elgie CollardArash  Radparvar M.D.   On: 08/06/2019 22:06   CT Chest W Contrast  Result Date: 08/06/2019 CLINICAL DATA:  Fall down 15 steps EXAM: CT CHEST, ABDOMEN, AND PELVIS WITH CONTRAST TECHNIQUE: Multidetector CT imaging of the chest, abdomen and pelvis was performed following the standard protocol during bolus administration of intravenous contrast. CONTRAST:  100mL OMNIPAQUE IOHEXOL 300 MG/ML  SOLN COMPARISON:  CT chest, abdomen and  pelvis, December 13, 2015, same day chest radiograph. FINDINGS: CT CHEST FINDINGS Cardiovascular: The aortic root is suboptimally assessed given cardiac pulsation artifact. Thoracic aorta is normal caliber. No intramural hematoma, dissection flap or other acute luminal abnormality of the aorta is seen. No periaortic stranding or hemorrhage. Shared origin of the brachiocephalic and left common carotid artery proximal great vessels normally opacified without acute traumatic abnormality. Mild cardiomegaly. No pericardial effusion. Central pulmonary arteries are normal caliber. No large central filling defects on this non tailored examination of the pulmonary arteries. Mediastinum/Nodes: No mediastinal fluid or gas. Normal thyroid gland and thoracic inlet. No acute abnormality of the trachea or esophagus. No worrisome mediastinal, hilar or axillary adenopathy. Lungs/Pleura: No acute traumatic abnormality of the lung parenchyma. Centrilobular and paraseptal emphysematous changes are similar to comparison exam. There is interlobular septal thickening  present towards the apices and lung bases. Mild fissural thickening is noted as well. Some ground-glass attenuation in the lung bases favoring atelectasis given the low volumes and central vascular crowding. No frank pleural effusion or pneumothorax. Scattered calcified pleural plaques are present. Musculoskeletal: Right fifth, sixth and seventh remote lateral rib fracture. Remote left fifth, sixth, seventh, eighth, ninth, tenth and eleventh rib fracture deformities are noted as well. Remote postsurgical changes of the distal left clavicle. Moderate bilateral glenohumeral and right acromioclavicular arthrosis. Included portions of the thoracic spine Multilevel degenerative changes are present in the imaged portions of the spine. Features are most pronounced towards the upper thoracic spine. No acute fracture or vertebral body height loss is seen. No traumatic listhesis. 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 Mild levocurvature upper thoracic spine is unchanged from prior. Mild bilateral gynecomastia. CT ABDOMEN PELVIS FINDINGS Hepatobiliary: No direct hepatic injury or perihepatic hematoma. No focal liver lesion. No gallbladder wall thickening, visible gallstone or biliary ductal dilatation. Pancreas: No direct traumatic pancreatic injury. Mild partial fatty replacement of the pancreas. No peripancreatic inflammation or ductal dilatation. Spleen: No direct splenic injury or perisplenic hematoma. Normal splenic size. No concerning splenic lesions. Adrenals/Urinary Tract: Normal adrenal glands. No adrenal hemorrhage or suspicious adrenal lesions. No direct renal injury or perinephric hematoma. Kidneys enhance and excrete symmetrically without extravasation of contrast on excretory phase delayed imaging. Scattered subcentimeter hypoattenuating foci in the left kidney too small to fully characterize on CT imaging but statistically likely benign. No worrisome renal mass, hydronephrosis or urolithiasis.  Urinary bladder is unremarkable without evidence of acute bladder injury. Stomach/Bowel: Distal esophagus, stomach and duodenal sweep are unremarkable. No small bowel wall thickening or dilatation. No evidence of obstruction. A normal appendix is visualized. No colonic dilatation or wall thickening. No mesenteric hematoma or contusive changes are evident. Vascular/Lymphatic: Minimal atheromatous plaque in the abdominal aorta. No direct vascular injury or acute vascular abnormality in the abdomen or pelvis nor in the included portions of the upper extremities. No suspicious or enlarged lymph nodes in the included lymphatic chains. Reproductive: Normal prostate and seminal vesicles. Moderate right hydrocele is noted with high-riding appearance of the right testicle, hyperattenuation in the region of the epididymis nonspecific. Small left hydrocele present as well. Other: No abdominopelvic free fluid or air. No bowel containing hernias. No traumatic abdominal wall dehiscence or body wall hematoma. Musculoskeletal: Multilevel discogenic and facet degenerative changes are present in the lumbar spine. Advanced sclerotic endplate changes at L1-2 with numerous subcortical cysts are favored to be on a degenerative basis in the absence of features to suggest discitis/osteomyelitis. There is levocurvature of the lumbar spine with an  apex at L4 which is similar to comparison. The bony pelvis is intact and congruent. Proximal femora are well seated within the acetabula. No proximal femoral fracture. Mild contusive changes are noted along the right posterolateral hip. IMPRESSION: 1. Minimal contusive changes the right posterolateral hip. 2. Moderate right and small left hydrocele. High-riding appearance of the right testicle, hyperattenuation in the region of the epididymis nonspecific. Recommend correlation with visual inspection and clinical findings to exclude the possibility of a testicular injury. 3. No other acute traumatic  injury in the chest, abdomen or pelvis. 4. Multiple remote bilateral rib deformities, detailed above. 5. 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 6. Features of CHF/volume overload with cardiomegaly and findings of interstitial edema. 7. Aortic Atherosclerosis (ICD10-I70.0) 8. Emphysema (ICD10-J43.9). Electronically Signed   By: Lovena Le M.D.   On: 08/06/2019 22:08   CT Cervical Spine Wo Contrast  Result Date: 08/06/2019 CLINICAL DATA:  69 year old male with trauma. EXAM: CT HEAD WITHOUT CONTRAST CT CERVICAL SPINE WITHOUT CONTRAST TECHNIQUE: Multidetector CT imaging of the head and cervical spine was performed following the standard protocol without intravenous contrast. Multiplanar CT image reconstructions of the cervical spine were also generated. COMPARISON:  Head CT dated 12/13/2015. FINDINGS: Evaluation of this exam is limited due to motion artifact. CT HEAD FINDINGS Brain: There is left frontal and left temporal subdural hemorrhage measuring approximately 5 mm in thickness over the left frontal lobe. Small amount of blood in the left temporal lobe (series 5 image 14) measuring approximately 6 x 12 mm may represent a small subarachnoid or intraparenchymal hemorrhage. No significant mass effect. No midline shift. There is moderate age-related atrophy and chronic microvascular ischemic changes. Vascular: No hyperdense vessel or unexpected calcification. Skull: There is a nondisplaced fracture of the right temporal calvarium extending into the right mastoid air cells. CT of the temporal bone may provide better evaluation. Sinuses/Orbits: The visualized paranasal sinuses and the left mastoid air cells are clear. There is partial opacification of the right mastoid air cells. There is soft tissue attenuating content within the right external auditory canal, likely broad products. Right globe prosthesis. Other: None CT CERVICAL SPINE FINDINGS Alignment: No acute subluxation. Skull  base and vertebrae: No acute fracture. Osteopenia. Soft tissues and spinal canal: No prevertebral fluid or swelling. No visible canal hematoma. Disc levels: Multilevel degenerative changes with endplate irregularity and disc space narrowing. Upper chest: Negative. Other: None IMPRESSION: 1. 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 A small left temporal subarachnoid hemorrhage may also be present. No significant mass effect or midline shift. 2. Nondisplaced fracture of the right temporal calvarium extending into the right mastoid air cells. CT of the temporal bone may provide better evaluation. 3. Right mastoid effusion/hemorrhage with probable blood product within the right external auditory canal. 4. No acute/traumatic cervical spine pathology. These results were called by telephone at the time of interpretation on 08/06/2019 at 9:54 pm to provider Rosalinda Seaman , who verbally acknowledged these results. Electronically Signed   By: Anner Crete M.D.   On: 08/06/2019 22:06   CT ABDOMEN PELVIS W CONTRAST  Result Date: 08/06/2019 CLINICAL DATA:  Fall down 15 steps EXAM: CT CHEST, ABDOMEN, AND PELVIS WITH CONTRAST TECHNIQUE: Multidetector CT imaging of the chest, abdomen and pelvis was performed following the standard protocol during bolus administration of intravenous contrast. CONTRAST:  147mL OMNIPAQUE IOHEXOL 300 MG/ML  SOLN COMPARISON:  CT chest, abdomen and pelvis, December 13, 2015, same day chest radiograph. FINDINGS: CT CHEST FINDINGS Cardiovascular:  The aortic root is suboptimally assessed given cardiac pulsation artifact. Thoracic aorta is normal caliber. No intramural hematoma, dissection flap or other acute luminal abnormality of the aorta is seen. No periaortic stranding or hemorrhage. Shared origin of the brachiocephalic and left common carotid artery proximal great vessels normally opacified without acute traumatic abnormality. Mild cardiomegaly. No pericardial effusion. Central  pulmonary arteries are normal caliber. No large central filling defects on this non tailored examination of the pulmonary arteries. Mediastinum/Nodes: No mediastinal fluid or gas. Normal thyroid gland and thoracic inlet. No acute abnormality of the trachea or esophagus. No worrisome mediastinal, hilar or axillary adenopathy. Lungs/Pleura: No acute traumatic abnormality of the lung parenchyma. Centrilobular and paraseptal emphysematous changes are similar to comparison exam. There is interlobular septal thickening present towards the apices and lung bases. Mild fissural thickening is noted as well. Some ground-glass attenuation in the lung bases favoring atelectasis given the low volumes and central vascular crowding. No frank pleural effusion or pneumothorax. Scattered calcified pleural plaques are present. Musculoskeletal: Right fifth, sixth and seventh remote lateral rib fracture. Remote left fifth, sixth, seventh, eighth, ninth, tenth and eleventh rib fracture deformities are noted as well. Remote postsurgical changes of the distal left clavicle. Moderate bilateral glenohumeral and right acromioclavicular arthrosis. Included portions of the thoracic spine Multilevel degenerative changes are present in the imaged portions of the spine. Features are most pronounced towards the upper thoracic spine. No acute fracture or vertebral body height loss is seen. No traumatic listhesis. 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 Mild levocurvature upper thoracic spine is unchanged from prior. Mild bilateral gynecomastia. CT ABDOMEN PELVIS FINDINGS Hepatobiliary: No direct hepatic injury or perihepatic hematoma. No focal liver lesion. No gallbladder wall thickening, visible gallstone or biliary ductal dilatation. Pancreas: No direct traumatic pancreatic injury. Mild partial fatty replacement of the pancreas. No peripancreatic inflammation or ductal dilatation. Spleen: No direct splenic injury or perisplenic hematoma.  Normal splenic size. No concerning splenic lesions. Adrenals/Urinary Tract: Normal adrenal glands. No adrenal hemorrhage or suspicious adrenal lesions. No direct renal injury or perinephric hematoma. Kidneys enhance and excrete symmetrically without extravasation of contrast on excretory phase delayed imaging. Scattered subcentimeter hypoattenuating foci in the left kidney too small to fully characterize on CT imaging but statistically likely benign. No worrisome renal mass, hydronephrosis or urolithiasis. Urinary bladder is unremarkable without evidence of acute bladder injury. Stomach/Bowel: Distal esophagus, stomach and duodenal sweep are unremarkable. No small bowel wall thickening or dilatation. No evidence of obstruction. A normal appendix is visualized. No colonic dilatation or wall thickening. No mesenteric hematoma or contusive changes are evident. Vascular/Lymphatic: Minimal atheromatous plaque in the abdominal aorta. No direct vascular injury or acute vascular abnormality in the abdomen or pelvis nor in the included portions of the upper extremities. No suspicious or enlarged lymph nodes in the included lymphatic chains. Reproductive: Normal prostate and seminal vesicles. Moderate right hydrocele is noted with high-riding appearance of the right testicle, hyperattenuation in the region of the epididymis nonspecific. Small left hydrocele present as well. Other: No abdominopelvic free fluid or air. No bowel containing hernias. No traumatic abdominal wall dehiscence or body wall hematoma. Musculoskeletal: Multilevel discogenic and facet degenerative changes are present in the lumbar spine. Advanced sclerotic endplate changes at L1-2 with numerous subcortical cysts are favored to be on a degenerative basis in the absence of features to suggest discitis/osteomyelitis. There is levocurvature of the lumbar spine with an apex at L4 which is similar to comparison. The bony pelvis is intact  and congruent. Proximal  femora are well seated within the acetabula. No proximal femoral fracture. Mild contusive changes are noted along the right posterolateral hip. IMPRESSION: 1. Minimal contusive changes the right posterolateral hip. 2. Moderate right and small left hydrocele. High-riding appearance of the right testicle, hyperattenuation in the region of the epididymis nonspecific. Recommend correlation with visual inspection and clinical findings to exclude the possibility of a testicular injury. 3. No other acute traumatic injury in the chest, abdomen or pelvis. 4. Multiple remote bilateral rib deformities, detailed above. 5. 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 6. Features of CHF/volume overload with cardiomegaly and findings of interstitial edema. 7. Aortic Atherosclerosis (ICD10-I70.0) 8. Emphysema (ICD10-J43.9). Electronically Signed   By: Kreg Shropshire M.D.   On: 08/06/2019 22:08   DG Pelvis Portable  Result Date: 08/06/2019 CLINICAL DATA:  Level 2 trauma, fell down 15 steps EXAM: PORTABLE PELVIS 1-2 VIEWS COMPARISON:  Radiograph 08/03/2017 FINDINGS: Included portions of the bony pelvis appear intact and congruent without abnormal diastatic widening of the SI joints or symphysis pubis. Portions of the sacrum are suboptimally evaluated due to overlying bowel gas and body habitus resulting in increased attenuation of the image. Mild degenerative changes in the lower lumbar spine and both hips. Proximal femora are intact and normally seated within the acetabula. IMPRESSION: 1. No acute fracture or traumatic malalignment in the pelvis. 2. Mild degenerative changes in the lower lumbar spine and both hips. Electronically Signed   By: Kreg Shropshire M.D.   On: 08/06/2019 21:24   DG Chest Port 1 View  Result Date: 08/06/2019 CLINICAL DATA:  Level 2 trauma, fall down 15 stairs EXAM: PORTABLE CHEST 1 VIEW COMPARISON:  Radiograph 11/03/2018 FINDINGS: Stable appearance of left fifth through eleventh and  right fifth through seventh posterolateral rib fractures. No acute visible displaced rib fractures evident. No other acute suspicious osseous lesion. Lung volumes are low with streaky areas of opacity in the lung bases suggesting atelectasis. Slight prominence of the cardiomediastinal silhouette is likely related to the portable supine technique and low lung volumes. No abnormal apical capping or irregular thickening of the cardiomediastinal borders. Tortuous aorta is similar in appearance to prior studies. IMPRESSION: Low lung volumes and streaky opacities favoring atelectasis. Mild cardiomegaly, possibly accentuated by technique. No other acute or traumatic finding in the chest. Electronically Signed   By: Kreg Shropshire M.D.   On: 08/06/2019 21:27    Procedures Procedures (including critical care time)  Medications Ordered in ED Medications  levETIRAcetam (KEPPRA) tablet 500 mg (has no administration in time range)  0.9 %  sodium chloride infusion (has no administration in time range)  acetaminophen (TYLENOL) tablet 650 mg (has no administration in time range)  HYDROmorphone (DILAUDID) injection 0.5 mg (has no administration in time range)  metoprolol tartrate (LOPRESSOR) injection 5 mg (has no administration in time range)  hydrALAZINE (APRESOLINE) injection 10 mg (has no administration in time range)  pantoprazole (PROTONIX) EC tablet 40 mg (has no administration in time range)    Or  pantoprazole (PROTONIX) injection 40 mg (has no administration in time range)  ondansetron (ZOFRAN-ODT) disintegrating tablet 4 mg (has no administration in time range)    Or  ondansetron (ZOFRAN) injection 4 mg (has no administration in time range)  docusate sodium (COLACE) capsule 100 mg (has no administration in time range)  oxyCODONE (Oxy IR/ROXICODONE) immediate release tablet 5 mg (has no administration in time range)  LORazepam (ATIVAN) tablet 1-4 mg ( Oral See Alternative 08/06/19  2338)    Or    LORazepam (ATIVAN) injection 1-4 mg (4 mg Intravenous Given 08/06/19 2338)  thiamine tablet 100 mg (has no administration in time range)    Or  thiamine (B-1) injection 100 mg (has no administration in time range)  folic acid (FOLVITE) tablet 1 mg (has no administration in time range)  multivitamin with minerals tablet 1 tablet (has no administration in time range)  LORazepam (ATIVAN) 2 MG/ML injection (has no administration in time range)  haloperidol lactate (HALDOL) injection 5 mg (has no administration in time range)  sodium chloride 0.9 % bolus 1,000 mL (1,000 mLs Intravenous New Bag/Given 08/07/19 0000)  iohexol (OMNIPAQUE) 300 MG/ML solution 100 mL (100 mLs Intravenous Contrast Given 08/06/19 2145)    ED Course  I have reviewed the triage vital signs and the nursing notes.  Pertinent labs & imaging results that were available during my care of the patient were reviewed by me and considered in my medical decision making (see chart for details).  69 yo male presenting to ED after a fall down multiple stairs, grossly intoxicated on arrival.  CT scans were notable for temporal fracture and ICH.  NSGY consulted and trauma consulted.  Patient admitted to ICU.   He was awake but quite belligerent with staff, making multiple attempts to get out of bed and stating "I'm ready to leave."  He appeared grossly intoxicated.  He was placed in soft restraints.  He was maintaining his airway at that time.  I did examine his lip laceration and felt that this will very likely heal on its own.  Bleeding has been controlled on its own.  There is no large laceration, lip flap, or violation of the vermilion border to warrant sutures at this time.    Clinical Course as of Aug 07 115  Sat Aug 06, 2019  2206 ICH and right temporal fracture on CT.  NSGY here at bedside.   [MT]  2231  IMPRESSION: 1. 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 A small left temporal subarachnoid hemorrhage may also be  present. No significant mass effect or midline shift. 2. Nondisplaced fracture of the right temporal calvarium extending into the right mastoid air cells. CT of the temporal bone may provide better evaluation. 3. Right mastoid effusion/hemorrhage with probable blood product within the right external auditory canal. 4. No acute/traumatic cervical spine pathology.   [MT]    Clinical Course User Index [MT] Deago Burruss, Kermit Balo, MD    Final Clinical Impression(s) / ED Diagnoses Final diagnoses:  Chest pain  Brain bleed [facility])  Open fracture of temporal bone, initial encounter [facility])  Alcoholic intoxication without complication [facility])    Rx / DC Orders ED Discharge Orders    None       Terald Sleeper, MD 08/07/19 [phone]

## 2019-08-06 NOTE — Consult Note (Signed)
Chief Complaint   No chief complaint on file.   HPI   Consult requested by: Dr Renaye Rakers, [facility] EDP Reason for consult: SDH, temporal bone fracture  HPI: Jeremiah West is a 68 y.o. male with history of etoh abuse who presented to ED after a fall down approximately 15 steps. Patient is intoxicated with etoh level 221. He is unable to provide any history. His girlfriend arrived shortly after my evaluation. He underwent trauma work up and was found to have SDH, right temporal bone fracture, right hip contusion, CHF/volume overload. A NSY consultation was requested for the SDH and temporal bone fracture.  There are no problems to display for this patient.   PMH: No past medical history on file.  PSH: (Not in a hospital admission)   SH: Social History   Tobacco Use  . Smoking status: Not on file  Substance Use Topics  . Alcohol use: Not on file  . Drug use: Not on file    MEDS: Prior to Admission medications   Not on File    ALLERGY: Not on File  Social History   Tobacco Use  . Smoking status: Not on file  Substance Use Topics  . Alcohol use: Not on file     No family history on file.   ROS   ROS intoxicated and agitated.  Exam   Vitals:   08/06/19 2140 08/06/19 2146  BP: 126/88 125/76  Pulse:    Resp: (!) 24 (!) 23  SpO2:     General appearance: african [place] male, blood right side of face, lip lac GCS E4V3/4M6 Eyes: No scleral injection Cardiovascular: Regular rate and rhythm without murmurs, rubs, gallops. No edema or variciosities. Distal pulses normal. Pulmonary: Effort normal, non-labored breathing Musculoskeletal:     Muscle tone upper extremities: Normal    Muscle tone lower extremities: Normal    Motor exam: MAEW with symmetric strength, seemingly non-focal although difficult to get accurate exam Neurological Mental Status:    - Patient is awake, alert, oriented to self, location but not year or situation    - Patient is unable to  give a clear and coherent history. Cranial Nerves    - II: Right eye absent. Left pupil reactive    - III/IV/VI: EOMI without ptosis or diploplia in left eye    - V: Facial sensation is grossly normal    - VII: Facial movement is symmetric.     - VIII: hearing is intact to voice    - X: Uvula elevates symmetrically    - XI: Shoulder shrug is symmetric.    - XII: tongue is midline without atrophy or fasciculations.  Sensory: Sensation grossly intact to LT  Right EAC: blood in canal. No obvious CSF drainage. Nares: no rhinorrhea  Results - Imaging/Labs   Results for orders placed or performed during the hospital encounter of 08/06/19 (from the past 48 hour(s))  Sample to Blood Bank     Status: None   Collection Time: 08/06/19  9:08 PM  Result Value Ref Range   Blood Bank Specimen SAMPLE AVAILABLE FOR TESTING    Sample Expiration      08/07/2019,2359 Performed at [facility], [address] [place], [place] [address]   I-Stat Chem 8, ED     Status: Abnormal   Collection Time: 08/06/19  9:19 PM  Result Value Ref Range   Sodium 139 135 - 145 mmol/L   Potassium 4.6 3.5 - 5.1 mmol/L   Chloride  101 98 - 111 mmol/L   BUN 9 8 - 23 mg/dL   Creatinine, Ser 8.29 0.61 - 1.24 mg/dL   Glucose, Bld 96 70 - 99 mg/dL    Comment: Glucose reference range applies only to samples taken after fasting for at least 8 hours.   Calcium, Ion 1.14 (L) 1.15 - 1.40 mmol/L   TCO2 26 22 - 32 mmol/L   Hemoglobin 19.7 (H) 13.0 - 17.0 g/dL   HCT 93.7 (H) 16.9 - 67.8 %  CBC     Status: Abnormal   Collection Time: 08/06/19  9:23 PM  Result Value Ref Range   WBC 4.1 4.0 - 10.5 K/uL   RBC 5.03 4.22 - 5.81 MIL/uL   Hemoglobin 17.2 (H) 13.0 - 17.0 g/dL   HCT 93.8 10.1 - 75.1 %   MCV 101.2 (H) 80.0 - 100.0 fL   MCH 34.2 (H) 26.0 - 34.0 pg   MCHC 33.8 30.0 - 36.0 g/dL   RDW 02.5 85.2 - 77.8 %   Platelets 206 150 - 400 K/uL   nRBC 0.0 0.0 - 0.2 %    Comment: Performed at [facility], [address] [place], [place] [address]  Ethanol     Status: Abnormal   Collection Time: 08/06/19  9:23 PM  Result Value Ref Range   Alcohol, Ethyl (B) 221 (H) <10 mg/dL    Comment: (NOTE) Lowest detectable limit for serum alcohol is 10 mg/dL. For medical purposes only. Performed at [facility], [address] [place], [place] [address]   Protime-INR     Status: None   Collection Time: 08/06/19  9:23 PM  Result Value Ref Range   Prothrombin Time 12.4 11.4 - 15.2 seconds   INR 0.9 0.8 - 1.2    Comment: (NOTE) INR goal varies based on device and disease states. Performed at [facility], [address] [place], [place] [address]   Lactic acid, plasma     Status: Abnormal   Collection Time: 08/06/19  9:24 PM  Result Value Ref Range   Lactic Acid, Venous 3.0 (HH) 0.5 - 1.9 mmol/L    Comment: CRITICAL RESULT CALLED TO, READ BACK BY AND VERIFIED WITH: Jone Baseman RN [id number] Myra Gianotti Performed at [facility], [address], [place], [place] [address]     DG Pelvis Portable  Result Date: 08/06/2019 CLINICAL DATA:  Level 2 trauma, fell down 15 steps EXAM: PORTABLE PELVIS 1-2 VIEWS COMPARISON:  Radiograph 08/03/2017 FINDINGS: Included portions of the bony pelvis appear intact and congruent without abnormal diastatic widening of the SI joints or symphysis pubis. Portions of the sacrum are suboptimally evaluated due to overlying bowel gas and body habitus resulting in increased attenuation of the image. Mild degenerative changes in the lower lumbar spine and both hips. Proximal femora are intact and normally seated within the acetabula. IMPRESSION: 1. No acute fracture or traumatic malalignment in the pelvis. 2. Mild degenerative changes in the lower lumbar spine and both hips. Electronically Signed   By: Kreg Shropshire M.D.   On: 08/06/2019 21:24   DG Chest Port 1 View  Result Date: 08/06/2019 CLINICAL DATA:  Level 2 trauma, fall down 15 stairs EXAM: PORTABLE CHEST 1  VIEW COMPARISON:  Radiograph 11/03/2018 FINDINGS: Stable appearance of left fifth through eleventh and right fifth through seventh posterolateral rib fractures. No acute visible displaced rib fractures evident. No other acute suspicious osseous lesion. Lung volumes are low with  streaky areas of opacity in the lung bases suggesting atelectasis. Slight prominence of the cardiomediastinal silhouette is likely related to the portable supine technique and low lung volumes. No abnormal apical capping or irregular thickening of the cardiomediastinal borders. Tortuous aorta is similar in appearance to prior studies. IMPRESSION: Low lung volumes and streaky opacities favoring atelectasis. Mild cardiomegaly, possibly accentuated by technique. No other acute or traumatic finding in the chest. Electronically Signed   By: Lovena Le M.D.   On: 08/06/2019 21:27   Impression/Plan   69 y.o. male found to have SDH/SAH, right temporal bone fracture, right hip contusion, CHF/volume overload noted on CT chest after a fall downstairs. He is intoxicated with etoh >200. I am unable to get a thorough neuro exam but he is confused, but does move all extremities well with seemingly non-focal strength. He will need to be admitted for further management and monitoring of SDH.   Left frontal and temporal SDH, left temporal SAH - no mass effect, no MLS - no role for NS intervention - q 2 hour neuro check - repeat head CT tomorrow am, sooner as indicated by exam - Keppra 500mg  BID x7days for routine seizure prophylaxis  Right temporal bone fracture - non-displaced - no drainage consistent with CSF - No role for NS intervention - pain control - may need outpatient ENT referral  Ferne Reus, PA-C [facility]

## 2019-08-06 NOTE — ED Notes (Signed)
In room to prevent pt from climbing out of bed, pt seated on edge of bed, swinging and pulling self out of bed. Pt had to be physically placed back in bed. Rec'd order for restraints from EDP. Placed at this time. Primary RN made aware of restraints, agitation and need for medication

## 2019-08-06 NOTE — ED Notes (Signed)
Girlfriend at   The bedside
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
## 2019-08-06 NOTE — ED Notes (Addendum)
Portable xrays chest pelvis 

## 2019-08-06 NOTE — ED Notes (Signed)
Pa for nundkumar at the bedsode

## 2019-08-06 NOTE — ED Notes (Addendum)
To ct

## 2019-08-06 NOTE — ED Notes (Signed)
Someone has shown up for the pt

## 2019-08-06 NOTE — ED Notes (Signed)
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

## 2019-08-06 NOTE — ED Notes (Addendum)
MD Fredricka Bonine notified of pt getting 6mg  total of Ativan due to pt's CIWA of 21 & continued agitation, anxiety, sweating, tremor. MD advised that haldol would be ordered for additional measure.

## 2019-08-06 NOTE — ED Notes (Signed)
Fast negative 

## 2019-08-06 NOTE — ED Notes (Addendum)
The p[t  Is in constant mpotion  Trying to get off the bed he has pulled off all his wires voided in the bed

## 2019-08-06 NOTE — ED Notes (Signed)
To CT scan- pt remains confused as to place and event- does know his birthdate and address

## 2019-08-07 ENCOUNTER — Inpatient Hospital Stay (HOSPITAL_COMMUNITY): Payer: Medicare Other

## 2019-08-07 LAB — BASIC METABOLIC PANEL
Anion gap: 17 — ABNORMAL HIGH (ref 5–15)
Anion gap: 15 (ref 5–15)
Anion gap: 13 (ref 5–15)
BUN: 7 mg/dL — ABNORMAL LOW (ref 8–23)
BUN: <5 mg/dL — ABNORMAL LOW (ref 8–23)
BUN: 6 mg/dL — ABNORMAL LOW (ref 8–23)
CO2: 17 mmol/L — ABNORMAL LOW (ref 22–32)
CO2: 21 mmol/L — ABNORMAL LOW (ref 22–32)
CO2: 21 mmol/L — ABNORMAL LOW (ref 22–32)
Calcium: 9 mg/dL (ref 8.9–10.3)
Calcium: 8.8 mg/dL — ABNORMAL LOW (ref 8.9–10.3)
Calcium: 9 mg/dL (ref 8.9–10.3)
Chloride: 103 mmol/L (ref 98–111)
Chloride: 103 mmol/L (ref 98–111)
Chloride: 103 mmol/L (ref 98–111)
Creatinine, Ser: 1.01 mg/dL (ref 0.61–1.24)
Creatinine, Ser: 1.04 mg/dL (ref 0.61–1.24)
Creatinine, Ser: 1.13 mg/dL (ref 0.61–1.24)
GFR calc Af Amer: >60 mL/min (ref >60)
GFR calc Af Amer: >60 mL/min (ref >60)
GFR calc Af Amer: >60 mL/min (ref >60)
GFR calc non Af Amer: >60 mL/min (ref >60)
GFR calc non Af Amer: >60 mL/min (ref >60)
GFR calc non Af Amer: >60 mL/min (ref >60)
Glucose, Bld: 106 mg/dL — ABNORMAL HIGH (ref 70–99)
Glucose, Bld: 128 mg/dL — ABNORMAL HIGH (ref 70–99)
Glucose, Bld: 117 mg/dL — ABNORMAL HIGH (ref 70–99)
Potassium: 5.6 mmol/L — ABNORMAL HIGH (ref 3.5–5.1)
Potassium: 4.3 mmol/L (ref 3.5–5.1)
Potassium: 4.9 mmol/L (ref 3.5–5.1)
Sodium: 137 mmol/L (ref 135–145)
Sodium: 139 mmol/L (ref 135–145)
Sodium: 137 mmol/L (ref 135–145)

## 2019-08-07 LAB — CBC
HCT: 47.1 % (ref 39.0–52.0)
Hemoglobin: 15.9 g/dL (ref 13.0–17.0)
MCH: 34.9 pg — ABNORMAL HIGH (ref 26.0–34.0)
MCHC: 33.8 g/dL (ref 30.0–36.0)
MCV: 103.3 fL — ABNORMAL HIGH (ref 80.0–100.0)
Platelets: 200 10*3/uL (ref 150–400)
RBC: 4.56 MIL/uL (ref 4.22–5.81)
RDW: 13.2 % (ref 11.5–15.5)
WBC: 7.9 10*3/uL (ref 4.0–10.5)
nRBC: 0 % (ref 0.0–0.2)

## 2019-08-07 LAB — RESPIRATORY PANEL BY RT PCR (FLU A&B, COVID)
Influenza A by PCR: NEGATIVE
Influenza B by PCR: NEGATIVE
SARS Coronavirus 2 by RT PCR: NEGATIVE

## 2019-08-07 LAB — MRSA PCR SCREENING: MRSA by PCR: NEGATIVE

## 2019-08-07 LAB — HIV ANTIBODY (ROUTINE TESTING W REFLEX): HIV Screen 4th Generation wRfx: NONREACTIVE

## 2019-08-07 MED ORDER — DEXTROSE 50 % IV SOLN: 1.0000 | Freq: Once | INTRAVENOUS | Status: DC

## 2019-08-07 MED ORDER — LEVETIRACETAM IN NACL 500 MG/100ML IV SOLN
500.0000 mg | Freq: Two times a day (BID) | INTRAVENOUS | Status: DC
  Administered 2019-08-07 – 2019-08-09 (×5): 500 mg via INTRAVENOUS
  Filled 2019-08-07 (×5): qty 100

## 2019-08-07 MED ORDER — ORAL CARE MOUTH RINSE
15.0000 mL | Freq: Two times a day (BID) | OROMUCOSAL | Status: DC
  Administered 2019-08-07 (×2): 15 mL via OROMUCOSAL

## 2019-08-07 MED ORDER — INSULIN ASPART 100 UNIT/ML IV SOLN: 10.0000 [IU] | Freq: Once | INTRAVENOUS | Status: DC

## 2019-08-07 MED ORDER — SODIUM CHLORIDE 0.9 % IV SOLN
1.0000 mg | Freq: Every day | INTRAVENOUS | Status: DC
  Administered 2019-08-07 – 2019-08-09 (×3): 1 mg via INTRAVENOUS
  Filled 2019-08-07 (×3): qty 0.2

## 2019-08-07 MED ORDER — CHLORHEXIDINE GLUCONATE CLOTH 2 % EX PADS
6.0000 | MEDICATED_PAD | Freq: Every day | CUTANEOUS | Status: DC
  Administered 2019-08-07 – 2019-08-17 (×12): 6 via TOPICAL

## 2019-08-07 MED ORDER — CALCIUM GLUCONATE-NACL 1-0.675 GM/50ML-% IV SOLN: 1.0000 g | Freq: Once | INTRAVENOUS | Status: DC

## 2019-08-07 MED ORDER — FUROSEMIDE 10 MG/ML IJ SOLN
20.0000 mg | Freq: Once | INTRAMUSCULAR | Status: AC
  Administered 2019-08-07: 20 mg via INTRAVENOUS
  Filled 2019-08-07: qty 2

## 2019-08-07 NOTE — Evaluation (Signed)
Physical Therapy Evaluation Patient Details Name: Jeremiah West MRN: [id number] DOB: [birth year] Today's Date: 08/07/2019   History of Present Illness  69 year old man who presents 08/06/19 after he fell down a flight of stairs (approximately 15 steps). Intoxicated with etoh level 221, history of alcohol abuse. Lt frontal and temporal SDH, Lt temporal SAH, right temporal bone fracture non-displaced PMHx-Emphysema, CHF with cardiomegaly  Clinical Impression   Pt admitted with above diagnosis. Patient very lethargic during evaluation (s/p ativan overnight) and unable to obtain prior functional status or home set up. Patient was following one step commands and was able to sit at EOB with min to moderate assistance. Attempted standing with pt weight bearing through his legs but not able to achieve full standing with 1 person assist. VSS on 2L [place] O2. Patient returned to bed due to continued 4 point restraints. Pt currently with functional limitations due to the deficits listed below (see PT Problem List). Pt will benefit from skilled PT to increase their independence and safety with mobility to allow discharge to the venue listed below.  Will continue to update discharge recommendations as pt's information re: home set up and caregiver assist becomes available and as pt more alert and able to participate.      Follow Up Recommendations Other (comment)(TBA as home set up known and pt more alert)    Equipment Recommendations  Other (comment)(TBD as pt progresses)    Recommendations for Other Services       Precautions / Restrictions Precautions Precautions: Fall Precaution Comments: in 4 point restraints; lethargic      Mobility  Bed Mobility Overal bed mobility: Needs Assistance Bed Mobility: Supine to Sit;Sit to Supine     Supine to sit: Max assist;+2 for physical assistance;HOB elevated;+2 for safety/equipment Sit to supine: Max assist;+2 for physical assistance;+2 for  safety/equipment   General bed mobility comments: pt arouses but with decr following commands; once feet over EOB, pt began to assist to raise his torso from side to sit; initiated return to supine after attempt to stand  Transfers Overall transfer level: Needs assistance Equipment used: None Transfers: Sit to/from Stand Sit to Stand: Max assist;+2 safety/equipment         General transfer comment: from ICU bed (taller); pt with weightbearing in bil LEs but not able to come to full standing  Ambulation/Gait             General Gait Details: unable to attempt  Stairs            Wheelchair Mobility    Modified Rankin (Stroke Patients Only)       Balance Overall balance assessment: Needs assistance Sitting-balance support: Single extremity supported;Feet supported Sitting balance-Leahy Scale: Poor   Postural control: Left lateral lean;Posterior lean                                   Pertinent Vitals/Pain Pain Assessment: Faces Faces Pain Scale: No hurt    Home Living Family/patient expects to be discharged to:: Unsure                 Additional Comments: pt too lethargic to answer reliably; states "yes" to every question    Prior Function           Comments: unable to confirm due to pt lethargy     Hand Dominance        Extremity/Trunk Assessment   Upper  Extremity Assessment Upper Extremity Assessment: Defer to OT evaluation    Lower Extremity Assessment Lower Extremity Assessment: Difficult to assess due to impaired cognition    Cervical / Trunk Assessment Cervical / Trunk Assessment: Normal  Communication   Communication: Expressive difficulties(slurred, words mostly unintelligible)  Cognition Arousal/Alertness: Lethargic;Suspect due to medications(on CIWA) Behavior During Therapy: Flat affect Overall Cognitive Status: Difficult to assess Area of Impairment: Rancho level;Orientation;Attention;Memory;Following  commands;Safety/judgement;Awareness;Problem solving               Rancho Levels of Cognitive Functioning Rancho Los Amigos Scales of Cognitive Functioning: Other (comment)(difficult to assess due to ativan/CIWA protocol) Orientation Level: Place;Time;Situation Current Attention Level: Sustained Memory: Decreased short-term memory Following Commands: Follows one step commands inconsistently Safety/Judgement: Decreased awareness of safety;Decreased awareness of deficits Awareness: (pre-intellectual) Problem Solving: Decreased initiation;Slow processing;Difficulty sequencing;Requires verbal cues;Requires tactile cues General Comments: could state his name, year of birth (month/day NT), could not recall in hospital or that he fell down steps even < 1 minute after told      General Comments General comments (skin integrity, edema, etc.): Arouses to name (only able to open left eye); states name and DOB (slurred and difficult to understand). Follows commands for coming to sit at EOB and scooting to put his feet on the floor and attempting to stand. Followed visual and verbal cues for LE ROM while seated.     Exercises     Assessment/Plan    PT Assessment Patient needs continued PT services  PT Problem List Decreased strength;Decreased balance;Decreased mobility;Decreased cognition;Decreased knowledge of use of DME;Decreased safety awareness;Decreased knowledge of precautions       PT Treatment Interventions DME instruction;Gait training;Stair training;Functional mobility training;Therapeutic activities;Therapeutic exercise;Balance training;Neuromuscular re-education;Cognitive remediation;Patient/family education    PT Goals (Current goals can be found in the Care Plan section)  Acute Rehab PT Goals Patient Stated Goal: pt unable to stat4e PT Goal Formulation: Patient unable to participate in goal setting Time For Goal Achievement: 08/21/19 Potential to Achieve Goals: Fair     Frequency Min 3X/week   Barriers to discharge   unknown home set up or caregiver support available     Co-evaluation               AM-PAC PT "6 Clicks" Mobility  Outcome Measure Help needed turning from your back to your side while in a flat bed without using bedrails?: Total Help needed moving from lying on your back to sitting on the side of a flat bed without using bedrails?: A Lot Help needed moving to and from a bed to a chair (including a wheelchair)?: Total Help needed standing up from a chair using your arms (e.g., wheelchair or bedside chair)?: Total Help needed to walk in hospital room?: Total Help needed climbing 3-5 steps with a railing? : Total 6 Click Score: 7    End of Session Equipment Utilized During Treatment: Oxygen Activity Tolerance: Patient limited by lethargy Patient left: in bed;with call bell/phone within reach;with bed alarm set;with restraints reapplied(bil wrist and bil ankle) Nurse Communication: Mobility status PT Visit Diagnosis: History of falling (Z91.81);Other symptoms and signs involving the nervous system (G31.517)    Time: 6160-7371 PT Time Calculation (min) (ACUTE ONLY): 17 min   Charges:   PT Evaluation $PT Eval Low Complexity: 1 Low           Jerolyn Center, PT Pager [phone]   Zena Amos 08/07/2019, 12:15 PM

## 2019-08-07 NOTE — ED Notes (Signed)
Pt awake yelling  All four extremity restraints checked no injury  Circulation ok

## 2019-08-07 NOTE — Progress Notes (Signed)
Vinny Costella contacted regarding pt inability to take PO keppra, awaiting new orders.

## 2019-08-07 NOTE — Progress Notes (Signed)
Patient ID: Jeremiah West, male   DOB: 23-Aug-1950, 69 y.o.   MRN: [id number] Follow up - Trauma Critical Care  Patient Details:    Jeremiah West is an 69 y.o. male.  Lines/tubes : External Urinary Catheter (Active)  Collection Container Standard drainage bag 08/07/19 0200  Securement Method Securing device (Describe) 08/07/19 0200  Site Assessment Intact 08/07/19 0200  Output (mL) 700 mL 08/07/19 0600    Microbiology/Sepsis markers: Results for orders placed or performed during the hospital encounter of 08/06/19  Respiratory Panel by RT PCR (Flu A&B, Covid) - Nasopharyngeal Swab     Status: None   Collection Time: 08/06/19 11:25 PM   Specimen: Nasopharyngeal Swab  Result Value Ref Range Status   SARS Coronavirus 2 by RT PCR NEGATIVE NEGATIVE Final    Comment: (NOTE) SARS-CoV-2 target nucleic acids are NOT DETECTED. The SARS-CoV-2 RNA is generally detectable in upper respiratoy specimens during the acute phase of infection. The lowest concentration of SARS-CoV-2 viral copies this assay can detect is 131 copies/mL. A negative result does not preclude SARS-Cov-2 infection and should not be used as the sole basis for treatment or other patient management decisions. A negative result may occur with  improper specimen collection/handling, submission of specimen other than nasopharyngeal swab, presence of viral mutation(s) within the areas targeted by this assay, and inadequate number of viral copies (<131 copies/mL). A negative result must be combined with clinical observations, patient history, and epidemiological information. The expected result is Negative. Fact Sheet for Patients:  https://www.moore.com/ Fact Sheet for Healthcare Providers:  https://www.young.biz/ This test is not yet ap proved or cleared by the [place] FDA and  has been authorized for detection and/or diagnosis of SARS-CoV-2 by FDA under an Emergency Use  Authorization (EUA). This EUA will remain  in effect (meaning this test can be used) for the duration of the COVID-19 declaration under Section 564(b)(1) of the Act, 21 U.S.C. section 360bbb-3(b)(1), unless the authorization is terminated or revoked sooner.    Influenza A by PCR NEGATIVE NEGATIVE Final   Influenza B by PCR NEGATIVE NEGATIVE Final    Comment: (NOTE) The Xpert Xpress SARS-CoV-2/FLU/RSV assay is intended as an aid in  the diagnosis of influenza from Nasopharyngeal swab specimens and  should not be used as a sole basis for treatment. Nasal washings and  aspirates are unacceptable for Xpert Xpress SARS-CoV-2/FLU/RSV  testing. Fact Sheet for Patients: https://www.moore.com/ Fact Sheet for Healthcare Providers: https://www.young.biz/ This test is not yet approved or cleared by the [place] FDA and  has been authorized for detection and/or diagnosis of SARS-CoV-2 by  FDA under an Emergency Use Authorization (EUA). This EUA will remain  in effect (meaning this test can be used) for the duration of the  Covid-19 declaration under Section 564(b)(1) of the Act, 21  U.S.C. section 360bbb-3(b)(1), unless the authorization is  terminated or revoked. Performed at [facility], [address], [place], [place] [address]   MRSA PCR Screening     Status: None   Collection Time: 08/07/19  2:19 AM   Specimen: Nasal Mucosa; Nasopharyngeal  Result Value Ref Range Status   MRSA by PCR NEGATIVE NEGATIVE Final    Comment:        The GeneXpert MRSA Assay (FDA approved for NASAL specimens only), is one component of a comprehensive MRSA colonization surveillance program. It is not intended to diagnose MRSA infection nor to guide or monitor treatment for MRSA infections. Performed at [facility]  [facility], [address] [place], [place] [address]     Anti-infectives:  Anti-infectives (From admission, onward)   None      Best  Practice/Protocols:  VTE Prophylaxis: Mechanical .  Consults: Treatment Team:  Consuella Lose, MD   Subjective:    Overnight Issues:   Objective:  Vital signs for last 24 hours: Temp:  [97.9 F (36.6 C)] 97.9 F (36.6 C) (04/18 0410) Pulse Rate:  [63-107] 95 (04/18 0700) Resp:  [16-27] 20 (04/18 0700) BP: (117-152)/(76-106) 152/86 (04/18 0700) SpO2:  [93 %-100 %] 98 % (04/18 0700) Weight:  [72.6 kg-73 kg] 73 kg (04/18 0410)  Hemodynamic parameters for last 24 hours:    Intake/Output from previous day: 04/17 0701 - 04/18 0700 In: -  Out: 700 [Urine:700]  Intake/Output this shift: No intake/output data recorded.  Vent settings for last 24 hours:    Physical Exam:  General: no respiratory distress Neuro: oriented to name only, does F/C HEENT/Neck: mild JVD Resp: clear to auscultation bilaterally CVS: RRR GI: soft, NT Extremities: no edema  Results for orders placed or performed during the hospital encounter of 08/06/19 (from the past 24 hour(s))  Sample to Blood Bank     Status: None   Collection Time: 08/06/19  9:08 PM  Result Value Ref Range   Blood Bank Specimen SAMPLE AVAILABLE FOR TESTING    Sample Expiration      08/07/2019,2359 Performed at [facility], [address] [place], [place] [address]   Urinalysis, Routine w reflex microscopic     Status: Abnormal   Collection Time: 08/06/19  9:11 PM  Result Value Ref Range   Color, Urine STRAW (A) YELLOW   APPearance CLEAR CLEAR   Specific Gravity, Urine 1.011 1.005 - 1.030   pH 6.0 5.0 - 8.0   Glucose, UA NEGATIVE NEGATIVE mg/dL   Hgb urine dipstick SMALL (A) NEGATIVE   Bilirubin Urine NEGATIVE NEGATIVE   Ketones, ur NEGATIVE NEGATIVE mg/dL   Protein, ur NEGATIVE NEGATIVE mg/dL   Nitrite NEGATIVE NEGATIVE   Leukocytes,Ua NEGATIVE NEGATIVE   RBC / HPF 0-5 0 - 5 RBC/hpf   WBC, UA 0-5 0 - 5 WBC/hpf   Bacteria, UA NONE SEEN NONE SEEN   Squamous Epithelial / LPF 0-5 0 - 5  I-Stat Chem 8,  ED     Status: Abnormal   Collection Time: 08/06/19  9:19 PM  Result Value Ref Range   Sodium 139 135 - 145 mmol/L   Potassium 4.6 3.5 - 5.1 mmol/L   Chloride 101 98 - 111 mmol/L   BUN 9 8 - 23 mg/dL   Creatinine, Ser 1.20 0.61 - 1.24 mg/dL   Glucose, Bld 96 70 - 99 mg/dL   Calcium, Ion 1.14 (L) 1.15 - 1.40 mmol/L   TCO2 26 22 - 32 mmol/L   Hemoglobin 19.7 (H) 13.0 - 17.0 g/dL   HCT 58.0 (H) 39.0 - 52.0 %  Comprehensive metabolic panel     Status: Abnormal   Collection Time: 08/06/19  9:23 PM  Result Value Ref Range   Sodium 138 135 - 145 mmol/L   Potassium 3.7 3.5 - 5.1 mmol/L   Chloride 101 98 - 111 mmol/L   CO2 24 22 - 32 mmol/L   Glucose, Bld 112 (H) 70 - 99 mg/dL   BUN 7 (L) 8 - 23 mg/dL   Creatinine, Ser 1.21 0.61 - 1.24 mg/dL   Calcium 9.5 8.9 - 10.3 mg/dL   Total Protein 8.4 (H)  6.5 - 8.1 g/dL   Albumin 4.3 3.5 - 5.0 g/dL   AST 34 15 - 41 U/L   ALT 32 0 - 44 U/L   Alkaline Phosphatase 80 38 - 126 U/L   Total Bilirubin 0.4 0.3 - 1.2 mg/dL   GFR calc non Af Amer >60 >60 mL/min   GFR calc Af Amer >60 >60 mL/min   Anion gap 13 5 - 15  CBC     Status: Abnormal   Collection Time: 08/06/19  9:23 PM  Result Value Ref Range   WBC 4.1 4.0 - 10.5 K/uL   RBC 5.03 4.22 - 5.81 MIL/uL   Hemoglobin 17.2 (H) 13.0 - 17.0 g/dL   HCT 75.6 43.3 - 29.5 %   MCV 101.2 (H) 80.0 - 100.0 fL   MCH 34.2 (H) 26.0 - 34.0 pg   MCHC 33.8 30.0 - 36.0 g/dL   RDW 18.8 41.6 - 60.6 %   Platelets 206 150 - 400 K/uL   nRBC 0.0 0.0 - 0.2 %  Ethanol     Status: Abnormal   Collection Time: 08/06/19  9:23 PM  Result Value Ref Range   Alcohol, Ethyl (B) 221 (H) <10 mg/dL  Protime-INR     Status: None   Collection Time: 08/06/19  9:23 PM  Result Value Ref Range   Prothrombin Time 12.4 11.4 - 15.2 seconds   INR 0.9 0.8 - 1.2  CK     Status: None   Collection Time: 08/06/19  9:23 PM  Result Value Ref Range   Total CK 143 49 - 397 U/L  Lactic acid, plasma     Status: Abnormal   Collection Time:  08/06/19  9:24 PM  Result Value Ref Range   Lactic Acid, Venous 3.0 (HH) 0.5 - 1.9 mmol/L  Respiratory Panel by RT PCR (Flu A&B, Covid) - Nasopharyngeal Swab     Status: None   Collection Time: 08/06/19 11:25 PM   Specimen: Nasopharyngeal Swab  Result Value Ref Range   SARS Coronavirus 2 by RT PCR NEGATIVE NEGATIVE   Influenza A by PCR NEGATIVE NEGATIVE   Influenza B by PCR NEGATIVE NEGATIVE  MRSA PCR Screening     Status: None   Collection Time: 08/07/19  2:19 AM   Specimen: Nasal Mucosa; Nasopharyngeal  Result Value Ref Range   MRSA by PCR NEGATIVE NEGATIVE  CBC     Status: Abnormal   Collection Time: 08/07/19  6:00 AM  Result Value Ref Range   WBC 7.9 4.0 - 10.5 K/uL   RBC 4.56 4.22 - 5.81 MIL/uL   Hemoglobin 15.9 13.0 - 17.0 g/dL   HCT 30.1 60.1 - 09.3 %   MCV 103.3 (H) 80.0 - 100.0 fL   MCH 34.9 (H) 26.0 - 34.0 pg   MCHC 33.8 30.0 - 36.0 g/dL   RDW 23.5 57.3 - 22.0 %   Platelets 200 150 - 400 K/uL   nRBC 0.0 0.0 - 0.2 %  Basic metabolic panel     Status: Abnormal   Collection Time: 08/07/19  6:00 AM  Result Value Ref Range   Sodium 137 135 - 145 mmol/L   Potassium 5.6 (H) 3.5 - 5.1 mmol/L   Chloride 103 98 - 111 mmol/L   CO2 17 (L) 22 - 32 mmol/L   Glucose, Bld 106 (H) 70 - 99 mg/dL   BUN 7 (L) 8 - 23 mg/dL   Creatinine, Ser 2.54 0.61 - 1.24 mg/dL   Calcium 9.0 8.9 -  10.3 mg/dL   GFR calc non Af Amer >60 >60 mL/min   GFR calc Af Amer >60 >60 mL/min   Anion gap 17 (H) 5 - 15    Assessment & Plan: Present on Admission: . Traumatic brain injury (HCC)    LOS: 1 day   Additional comments:I reviewed the patient's new clinical lab test results. and CT  Fall down stairs TBI/L SDH/SAH - F/U CT stable, per Dr. Conchita Paris. TBI team therapies Temporal bone FX Hyperkalemia - insulin/D50/Ca Signs of CHF on imaging - lasix x 1, IVF at 50 FEN - speech therapy for swallow eval, may need Cortrak tomorrow VTE - PAS Dispo - ICU Critical Care Total Time*: 33  Minutes  Violeta Gelinas, MD, MPH, FACS Trauma & General Surgery Use [email or web address] to contact on call provider  08/07/2019  *Care during the described time interval was provided by me. I have reviewed this patient's available data, including medical history, events of note, physical examination and test results as part of my evaluation.

## 2019-08-07 NOTE — ED Notes (Signed)
Report given to rn on 4n 
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
## 2019-08-07 NOTE — ED Notes (Signed)
Called and left VM with SO - [name] - regarding bed assignment

## 2019-08-07 NOTE — Progress Notes (Signed)
SLP Cancellation Note  Patient Details Name: Jeremiah West MRN: [id number] DOB: [birth year]   Cancelled treatment:        Attempted swallow evaluation. Pt was not able to participate due to lethargy. Will re-attempt at next available date.    Luis Abed, MA CCC-SLP 08/07/2019, 12:01 PM

## 2019-08-07 NOTE — Progress Notes (Signed)
  NEUROSURGERY PROGRESS NOTE   Repeat head CT reviewed and compared to prior. No changes in SDH. Patient is cleared for discharge from NS perspective. No need for additional imaging unless exam changes. Would like to see him in 3-4 weeks for monitoring with a repeat head CT. Complete 7 day course of Keppra for routine seizure prophylaxis.  Cindra Presume, PA-C [facility]

## 2019-08-08 LAB — CBC
HCT: 46.2 % (ref 39.0–52.0)
Hemoglobin: 16 g/dL (ref 13.0–17.0)
MCH: 34.9 pg — ABNORMAL HIGH (ref 26.0–34.0)
MCHC: 34.6 g/dL (ref 30.0–36.0)
MCV: 100.9 fL — ABNORMAL HIGH (ref 80.0–100.0)
Platelets: 179 10*3/uL (ref 150–400)
RBC: 4.58 MIL/uL (ref 4.22–5.81)
RDW: 13 % (ref 11.5–15.5)
WBC: 7.5 10*3/uL (ref 4.0–10.5)
nRBC: 0 % (ref 0.0–0.2)

## 2019-08-08 LAB — BASIC METABOLIC PANEL
Anion gap: 11 (ref 5–15)
BUN: 6 mg/dL — ABNORMAL LOW (ref 8–23)
CO2: 28 mmol/L (ref 22–32)
Calcium: 9 mg/dL (ref 8.9–10.3)
Chloride: 101 mmol/L (ref 98–111)
Creatinine, Ser: 1.04 mg/dL (ref 0.61–1.24)
GFR calc Af Amer: >60 mL/min (ref >60)
GFR calc non Af Amer: >60 mL/min (ref >60)
Glucose, Bld: 110 mg/dL — ABNORMAL HIGH (ref 70–99)
Potassium: 3.9 mmol/L (ref 3.5–5.1)
Sodium: 140 mmol/L (ref 135–145)

## 2019-08-08 LAB — GLUCOSE, CAPILLARY
Glucose-Capillary: 105 mg/dL — ABNORMAL HIGH (ref 70–99)
Glucose-Capillary: 154 mg/dL — ABNORMAL HIGH (ref 70–99)

## 2019-08-08 LAB — MAGNESIUM: Magnesium: 1.9 mg/dL (ref 1.7–2.4)

## 2019-08-08 LAB — CK: Total CK: 257 U/L (ref 49–397)

## 2019-08-08 LAB — PHOSPHORUS: Phosphorus: 1.9 mg/dL — ABNORMAL LOW (ref 2.5–4.6)

## 2019-08-08 MED ORDER — PRO-STAT SUGAR FREE PO LIQD: 30.0000 mL | Freq: Two times a day (BID) | ORAL | Status: DC

## 2019-08-08 MED ORDER — ORAL CARE MOUTH RINSE
15.0000 mL | Freq: Two times a day (BID) | OROMUCOSAL | Status: DC
  Administered 2019-08-08 – 2019-08-18 (×18): 15 mL via OROMUCOSAL

## 2019-08-08 MED ORDER — CHLORHEXIDINE GLUCONATE 0.12 % MT SOLN
15.0000 mL | Freq: Two times a day (BID) | OROMUCOSAL | Status: DC
  Administered 2019-08-08 – 2019-08-18 (×20): 15 mL via OROMUCOSAL
  Filled 2019-08-08 (×20): qty 15

## 2019-08-08 MED ORDER — PIVOT 1.5 CAL PO LIQD
1000.0000 mL | ORAL | Status: DC
  Administered 2019-08-08 – 2019-08-17 (×8): 1000 mL
  Filled 2019-08-08 (×11): qty 1000

## 2019-08-08 MED ORDER — VITAL HIGH PROTEIN PO LIQD: 1000.0000 mL | ORAL | Status: DC

## 2019-08-08 MED ORDER — ENOXAPARIN SODIUM 30 MG/0.3ML ~~LOC~~ SOLN
30.0000 mg | Freq: Two times a day (BID) | SUBCUTANEOUS | Status: DC
  Administered 2019-08-08 – 2019-08-18 (×21): 30 mg via SUBCUTANEOUS
  Filled 2019-08-08 (×21): qty 0.3
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
## 2019-08-08 NOTE — Progress Notes (Signed)
Physical Therapy Treatment Patient Details Name: Jeremiah West MRN: [id number] DOB: 21-Apr-1951 Today's Date: 08/08/2019    History of Present Illness 69 year old man who presents 08/06/19 after he fell down a flight of stairs (approximately 15 steps). Intoxicated with etoh level 221, history of alcohol abuse. Lt frontal and temporal SDH, Lt temporal SAH, right temporal bone fracture non-displaced PMHx-Emphysema, CHF with cardiomegaly    PT Comments    Pt remains lethargic with minimal eye opening. Pt was attempting to answer yes/no questions and wasn't perseverating on one answer, however unsure of accuracy. Pt requiring maxA at this time to maintain EOB balance. Per patient he states he lives with someone at home. At this time recommending CIR upon d/c for both cognitive and functional rehab. Unable to appropriately assess Rancho level at this time as pt was given ativan and was very lethargic. Suspecting close to Rancho IV as pt was very agitated and in 4 point restraints and requiring ativan. Acute PT to cont to follow.    Follow Up Recommendations  Other (comment);CIR(pending support at home, pt states lives with someone)     Equipment Recommendations  Other (comment)(TBD as pt progresses)    Recommendations for Other Services       Precautions / Restrictions Precautions Precautions: Fall Precaution Comments: in 4 point restraints; lethargic Restrictions Weight Bearing Restrictions: No    Mobility  Bed Mobility Overal bed mobility: Needs Assistance Bed Mobility: Supine to Sit;Sit to Supine     Supine to sit: Max assist;+2 for physical assistance;HOB elevated;+2 for safety/equipment Sit to supine: Mod assist;+2 for physical assistance;+2 for safety/equipment   General bed mobility comments: Max A +2 to bring BLEs towards EOB and elevate trunk. Mod A +2 for returning to bed with assistance for elevating BLEs.  Transfers                 General transfer comment:  Defered for safety due to decreased arousal  Ambulation/Gait             General Gait Details: unable to attempt   Stairs             Wheelchair Mobility    Modified Rankin (Stroke Patients Only)       Balance Overall balance assessment: Needs assistance Sitting-balance support: Single extremity supported;Feet supported Sitting balance-Leahy Scale: Poor   Postural control: Left lateral lean;Posterior lean                                  Cognition Arousal/Alertness: Lethargic;Suspect due to medications(on CIWA) Behavior During Therapy: Flat affect Overall Cognitive Status: Difficult to assess Area of Impairment: Rancho level;Orientation;Attention;Memory;Following commands;Safety/judgement;Awareness;Problem solving               Rancho Levels of Cognitive Functioning Rancho Los Amigos Scales of Cognitive Functioning: Other (comment)(difficult to assess due to ativan/CIWA protocol) Orientation Level: Place;Time;Situation Current Attention Level: Sustained Memory: Decreased short-term memory Following Commands: Follows one step commands inconsistently Safety/Judgement: Decreased awareness of safety;Decreased awareness of deficits Awareness: (pre-intellectual) Problem Solving: Decreased initiation;Slow processing;Difficulty sequencing;Requires verbal cues;Requires tactile cues General Comments: stated he lived with someone in a one story home without stairs      Exercises      General Comments General comments (skin integrity, edema, etc.): VSS, pt blind in R eye      Pertinent Vitals/Pain Pain Assessment: Faces Faces Pain Scale: No hurt Pain Intervention(s): Monitored during session  Home Living Family/patient expects to be discharged to:: Unsure               Additional Comments: Pt stating yes/no to simple questions such as "do you lives with someone?" Pt reporting he lives in a sinlge level home, alone. Unsure of  reliability due to lethargy, confusion, and incontient answers.    Prior Function        Comments: unable to confirm due to pt lethargy   PT Goals (current goals can now be found in the care plan section) Acute Rehab PT Goals Patient Stated Goal: pt unable to state Progress towards PT goals: Progressing toward goals    Frequency    Min 3X/week      PT Plan Current plan remains appropriate    Co-evaluation PT/OT/SLP Co-Evaluation/Treatment: Yes Reason for Co-Treatment: Complexity of the patient's impairments (multi-system involvement);For patient/therapist safety PT goals addressed during session: Mobility/safety with mobility        AM-PAC PT "6 Clicks" Mobility   Outcome Measure  Help needed turning from your back to your side while in a flat bed without using bedrails?: A Lot Help needed moving from lying on your back to sitting on the side of a flat bed without using bedrails?: A Lot Help needed moving to and from a bed to a chair (including a wheelchair)?: Total Help needed standing up from a chair using your arms (e.g., wheelchair or bedside chair)?: Total Help needed to walk in hospital room?: Total Help needed climbing 3-5 steps with a railing? : Total 6 Click Score: 8    End of Session Equipment Utilized During Treatment: Oxygen Activity Tolerance: Patient limited by lethargy Patient left: in bed;with call bell/phone within reach;with bed alarm set;with restraints reapplied(bil wrist and bil ankle) Nurse Communication: Mobility status PT Visit Diagnosis: History of falling (Z91.81);Other symptoms and signs involving the nervous system (X44.818)     Time: 5631-4970 PT Time Calculation (min) (ACUTE ONLY): 23 min  Charges:  $Therapeutic Activity: 8-22 mins                     Lewis Shock, PT, DPT Acute Rehabilitation Services Pager #: [phone] Office #: [phone]    Iona Hansen 08/08/2019, 1:22 PM

## 2019-08-08 NOTE — Progress Notes (Signed)
Initial Nutrition Assessment  DOCUMENTATION CODES:   Non-severe (moderate) malnutrition in context of chronic illness  INTERVENTION:   Initiate Pivot 1.5 @ 25 ml/hr via Cortrak tube and increase by 10 ml every 8 hours to goal rate of 55 ml/hr (1320 ml/day)  Provides: 1980 kcal, 123 grams protein, and 1001 ml free water.   Monitor magnesium and phosphorus daily every 12 hours x 4 occurences or until normalized, MD to replete as needed, as pt is at risk for refeeding syndrome given moderate malnutrition.  NUTRITION DIAGNOSIS:   Moderate Malnutrition related to chronic illness(chronic medical issues and/or ETOH abuse) as evidenced by mild fat depletion, moderate muscle depletion.  GOAL:   Patient will meet greater than or equal to 90% of their needs  MONITOR:   Diet advancement, TF tolerance  REASON FOR ASSESSMENT:   Consult Enteral/tube feeding initiation and management  ASSESSMENT:   Pt intoxicated and admitted s/p fall down 15 steps with TBI/L SDH/SAH, and temporal bone fx. Pt with known ETOH abuse with findings of aortic atherosclerosis, emphysema, and CHF/volume overload with cardiomegaly found per MD on exam.   Pt discussed during ICU rounds and with RN.  Plan for cortrak placement 4/19.  Spoke with pt who was in and out of alertness. Says he is aware that RD is coming to place a feeding tube.  No family present at this time.    Medications reviewed and include: colace, MVI, thiamine, folic acid Labs reviewed: PO4: 1.9 (L)    NUTRITION - FOCUSED PHYSICAL EXAM:    Most Recent Value  Orbital Region  Mild depletion  Upper Arm Region  Mild depletion  Thoracic and Lumbar Region  No depletion  Buccal Region  No depletion  Temple Region  Moderate depletion  Clavicle Bone Region  Moderate depletion  Clavicle and Acromion Bone Region  Moderate depletion  Scapular Bone Region  Unable to assess  Dorsal Hand  Moderate depletion  Patellar Region  No depletion  Anterior  Thigh Region  No depletion  Posterior Calf Region  No depletion  Edema (RD Assessment)  None  Hair  Reviewed  Eyes  Reviewed  Mouth  Reviewed  Skin  Reviewed  Nails  Reviewed       Diet Order:   Diet Order            Diet NPO time specified  Diet effective now              EDUCATION NEEDS:   No education needs have been identified at this time  Skin:     Last BM:  unknown  Height:   Ht Readings from Last 1 Encounters:  08/06/19 5\' 8"  (1.727 m)    Weight:   Wt Readings from Last 1 Encounters:  08/07/19 73 kg    Ideal Body Weight:  70 kg  BMI:  Body mass index is 24.47 kg/m.  Estimated Nutritional Needs:   Kcal:  1900-2100  Protein:  110-125 grams  Fluid:  2 L/day  08/09/19., RD, LDN, CNSC See AMiON for contact information

## 2019-08-08 NOTE — Progress Notes (Signed)
Rehab Admissions Coordinator Note:  Patient was screened by Clois Dupes for appropriateness for an Inpatient Acute Rehab Consult per therapy recs. .  At this time, we are recommending Inpatient Rehab consult. Please place order for rehab consult if you would like patient considered for admit. Please advise.  Clois Dupes RN MSN 08/08/2019, 3:32 PM  I can be reached at [phone].

## 2019-08-08 NOTE — Social Work (Signed)
CSW was unable to complete sbirt due to pt not being oriented. CSW may attempt at more appropriate time.   Jimmy Picket, Theresia Majors, [place] Clinical Social Worker [phone]

## 2019-08-08 NOTE — Procedures (Signed)
Cortrak  Person Inserting Tube:  Osa Craver, RD Tube Type:  Cortrak - 43 inches Tube Location:  Left nare Initial Placement:  Stomach Secured by: Bridle Technique Used to Measure Tube Placement:  Documented cm marking at nare/ corner of mouth Cortrak Secured At:  59 cm    Cortrak Tube Team Note:  Consult received to place a Cortrak feeding tube.   No x-ray is required. RN may begin using tube.   If the tube becomes dislodged please keep the tube and contact the Cortrak team at [email or web address] (password [email or web address]) for replacement.  If after hours and replacement cannot be delayed, place a NG tube and confirm placement with an abdominal x-ray.    Romelle Starcher MS, RDN, LDN, CNSC RD Pager Number and Weekend/On-Call After Hours Pager Located in [place]

## 2019-08-08 NOTE — Progress Notes (Signed)
Trauma/Critical Care Follow Up Note  Subjective:    Overnight Issues:   Objective:  Vital signs for last 24 hours: Temp:  [97.2 F (36.2 C)-98.7 F (37.1 C)] 98.6 F (37 C) (04/19 0800) Pulse Rate:  [64-92] 64 (04/19 0800) Resp:  [12-26] 19 (04/19 0800) BP: (132-169)/(74-128) 152/77 (04/19 0800) SpO2:  [91 %-99 %] 97 % (04/19 0800)  Hemodynamic parameters for last 24 hours:    Intake/Output from previous day: 04/18 0701 - 04/19 0700 In: 1457.6 [I.V.:1207.6; IV Piggyback:250] Out: 2300 [Urine:2300]  Intake/Output this shift: Total I/O In: 184.1 [I.V.:84.1; IV Piggyback:100] Out: -   Vent settings for last 24 hours:    Physical Exam:  Gen: comfortable, no distress Neuro: not participatory in exam, not following commands HEENT: PERRL Neck: supple CV: RRR Pulm: unlabored breathing Abd: soft, NT GU: clear yellow urine Extr: wwp, no edema   Results for orders placed or performed during the hospital encounter of 08/06/19 (from the past 24 hour(s))  Basic metabolic panel     Status: Abnormal   Collection Time: 08/07/19 11:00 AM  Result Value Ref Range   Sodium 139 135 - 145 mmol/L   Potassium 4.3 3.5 - 5.1 mmol/L   Chloride 103 98 - 111 mmol/L   CO2 21 (L) 22 - 32 mmol/L   Glucose, Bld 128 (H) 70 - 99 mg/dL   BUN <5 (L) 8 - 23 mg/dL   Creatinine, Ser 1.04 0.61 - 1.24 mg/dL   Calcium 8.8 (L) 8.9 - 10.3 mg/dL   GFR calc non Af Amer >60 >60 mL/min   GFR calc Af Amer >60 >60 mL/min   Anion gap 15 5 - 15  Basic metabolic panel     Status: Abnormal   Collection Time: 08/07/19  3:08 PM  Result Value Ref Range   Sodium 137 135 - 145 mmol/L   Potassium 4.9 3.5 - 5.1 mmol/L   Chloride 103 98 - 111 mmol/L   CO2 21 (L) 22 - 32 mmol/L   Glucose, Bld 117 (H) 70 - 99 mg/dL   BUN 6 (L) 8 - 23 mg/dL   Creatinine, Ser 1.13 0.61 - 1.24 mg/dL   Calcium 9.0 8.9 - 10.3 mg/dL   GFR calc non Af Amer >60 >60 mL/min   GFR calc Af Amer >60 >60 mL/min   Anion gap 13 5 - 15   CBC     Status: Abnormal   Collection Time: 08/08/19  6:10 AM  Result Value Ref Range   WBC 7.5 4.0 - 10.5 K/uL   RBC 4.58 4.22 - 5.81 MIL/uL   Hemoglobin 16.0 13.0 - 17.0 g/dL   HCT 46.2 39.0 - 52.0 %   MCV 100.9 (H) 80.0 - 100.0 fL   MCH 34.9 (H) 26.0 - 34.0 pg   MCHC 34.6 30.0 - 36.0 g/dL   RDW 13.0 11.5 - 15.5 %   Platelets 179 150 - 400 K/uL   nRBC 0.0 0.0 - 0.2 %  Basic metabolic panel     Status: Abnormal   Collection Time: 08/08/19  6:10 AM  Result Value Ref Range   Sodium 140 135 - 145 mmol/L   Potassium 3.9 3.5 - 5.1 mmol/L   Chloride 101 98 - 111 mmol/L   CO2 28 22 - 32 mmol/L   Glucose, Bld 110 (H) 70 - 99 mg/dL   BUN 6 (L) 8 - 23 mg/dL   Creatinine, Ser 1.04 0.61 - 1.24 mg/dL   Calcium 9.0 8.9 -  10.3 mg/dL   GFR calc non Af Amer >60 >60 mL/min   GFR calc Af Amer >60 >60 mL/min   Anion gap 11 5 - 15    Assessment & Plan: The plan of care was discussed with the bedside nurse for the day, Brooke, who is in agreement with this plan and no additional concerns were raised.   Present on Admission: . Traumatic brain injury (HCC)    LOS: 2 days   Additional comments:I reviewed the patient's new clinical lab test results.   and I reviewed the patients new imaging test results.    Fall down stairs  TBI/L SDH/SAH - f/u CT stable 4/18, NSGY c/s, Dr. Conchita Paris. Keppra x7d for sz ppx. TBI team therapies. Not following commands. Temporal bone FX - NSGY c/s as above Hyperkalemia - continue to monitor Signs of CHF on imaging - lasix x1, IVF at 50 FEN - Cortrak and TF today, monitor K and creat. Check CK, mag/phos VTE - PAS, start LMWH Dispo - 4NP, PT/OT/SLP/TOC  Attempted to reach sister, [name] at [phone], no answer, left VM.   Diamantina Monks, MD Trauma & General Surgery Please use [email or web address] to contact on call provider  08/08/2019  *Care during the described time interval was provided by me. I have reviewed this patient's available data, including  medical history, events of note, physical examination and test results as part of my evaluation.

## 2019-08-08 NOTE — Evaluation (Signed)
Occupational Therapy Evaluation Patient Details Name: Jeremiah West MRN: [id number] DOB: [birth year] Today's Date: 08/08/2019    History of Present Illness 69 year old man who presents 08/06/19 after he fell down a flight of stairs (approximately 15 steps). Intoxicated with etoh level 221, history of alcohol abuse. Lt frontal and temporal SDH, Lt temporal SAH, right temporal bone fracture non-displaced PMHx-Emphysema, CHF with cardiomegaly   Clinical Impression   Upon arrival, pt was sleeping and supine in bed. Pt with decreased arousal and poor follow commands. Pt requiring Total A for ADLs and Max A +2 for bed mobility. Pt presenting with decreased balance, strength, coordination, and arousal level. Pt would benefit from further acute OT to facilitate safe dc. Recommend dc to CIR for further OT to optimize safety, independence with ADLs, and return to PLOF.     Follow Up Recommendations  CIR;Supervision/Assistance - 24 hour(Pending progress)    Equipment Recommendations  Other (comment)(Defer to next venue)    Recommendations for Other Services PT consult     Precautions / Restrictions Precautions Precautions: Fall Precaution Comments: in 4 point restraints; lethargic Restrictions Weight Bearing Restrictions: No      Mobility Bed Mobility Overal bed mobility: Needs Assistance Bed Mobility: Supine to Sit;Sit to Supine     Supine to sit: Max assist;+2 for physical assistance;HOB elevated;+2 for safety/equipment Sit to supine: Mod assist;+2 for physical assistance;+2 for safety/equipment   General bed mobility comments: Max A +2 to bring BLEs towards EOB and elevate trunk. Mod A +2 for returning to bed with assistance for elevating BLEs.  Transfers                 General transfer comment: Defered for safety due to decreased arousal    Balance Overall balance assessment: Needs assistance Sitting-balance support: Single extremity supported;Feet supported Sitting  balance-Leahy Scale: Poor                                     ADL either performed or assessed with clinical judgement   ADL Overall ADL's : Needs assistance/impaired                                       General ADL Comments: Pt requiring Total A for ADLs due to decreased cognition, balance, and activity tolerance     Vision         Perception     Praxis      Pertinent Vitals/Pain Pain Assessment: Faces Faces Pain Scale: No hurt Pain Intervention(s): Monitored during session     Hand Dominance     Extremity/Trunk Assessment Upper Extremity Assessment Upper Extremity Assessment: Difficult to assess due to impaired cognition   Lower Extremity Assessment Lower Extremity Assessment: Defer to PT evaluation   Cervical / Trunk Assessment Cervical / Trunk Assessment: Normal   Communication Communication Communication: Expressive difficulties(slurred, words mostly unintelligible)   Cognition Arousal/Alertness: Lethargic;Suspect due to medications(on CIWA) Behavior During Therapy: Flat affect Overall Cognitive Status: Difficult to assess Area of Impairment: Rancho level;Orientation;Attention;Memory;Following commands;Safety/judgement;Awareness;Problem solving               Rancho Levels of Cognitive Functioning Rancho Los Amigos Scales of Cognitive Functioning: Other (comment)(difficult to assess due to ativan/CIWA protocol) Orientation Level: Place;Time;Situation Current Attention Level: Sustained Memory: Decreased short-term memory Following Commands: Follows one step commands inconsistently Safety/Judgement:  Decreased awareness of safety;Decreased awareness of deficits Awareness: (pre-intellectual) Problem Solving: Decreased initiation;Slow processing;Difficulty sequencing;Requires verbal cues;Requires tactile cues General Comments: could state his name, year of birth (month/day NT), could not recall in hospital or that he fell  down steps even < 1 minute after told   General Comments  VSS.     Exercises     Shoulder Instructions      Home Living Family/patient expects to be discharged to:: Unsure                                 Additional Comments: Pt stating yes/no to simple questions such as "do you lives with someone?" Pt reporting he lives in a sinlge level home, alone. Unsure of reliability due to lethargy, confusion, and incontient answers.      Prior Functioning/Environment          Comments: unable to confirm due to pt lethargy        OT Problem List: Decreased strength;Decreased range of motion;Decreased activity tolerance;Impaired balance (sitting and/or standing);Decreased knowledge of use of DME or AE;Decreased knowledge of precautions;Pain      OT Treatment/Interventions: Self-care/ADL training;Therapeutic exercise;Energy conservation;DME and/or AE instruction;Therapeutic activities;Patient/family education    OT Goals(Current goals can be found in the care plan section) Acute Rehab OT Goals Patient Stated Goal: pt unable to state OT Goal Formulation: With patient Time For Goal Achievement: 08/22/19 Potential to Achieve Goals: Good  OT Frequency: Min 2X/week   Barriers to D/C:            Co-evaluation              AM-PAC OT "6 Clicks" Daily Activity     Outcome Measure Help from another person eating meals?: Total Help from another person taking care of personal grooming?: Total Help from another person toileting, which includes using toliet, bedpan, or urinal?: Total Help from another person bathing (including washing, rinsing, drying)?: Total Help from another person to put on and taking off regular upper body clothing?: Total Help from another person to put on and taking off regular lower body clothing?: Total 6 Click Score: 6   End of Session Nurse Communication: Mobility status  Activity Tolerance: Patient limited by lethargy Patient left: in  bed;with call bell/phone within reach;with bed alarm set;with restraints reapplied  OT Visit Diagnosis: Unsteadiness on feet (R26.81);Other abnormalities of gait and mobility (R26.89);Muscle weakness (generalized) (M62.81);Pain Pain - part of body: (Generalized)                Time: 6761-9509 OT Time Calculation (min): 23 min Charges:  OT General Charges $OT Visit: 1 Visit OT Evaluation $OT Eval Moderate Complexity: [place], OTR/L Acute Rehab Pager: [phone] Office: [place] 08/08/2019, 12:55 PM

## 2019-08-09 ENCOUNTER — Encounter (HOSPITAL_COMMUNITY): Payer: Self-pay

## 2019-08-09 DIAGNOSIS — S069X6D Unspecified intracranial injury with loss of consciousness greater than 24 hours without return to pre-existing conscious level with patient surviving, subsequent encounter: Secondary | ICD-10-CM | POA: Diagnosis not present

## 2019-08-09 LAB — GLUCOSE, CAPILLARY
Glucose-Capillary: 104 mg/dL — ABNORMAL HIGH (ref 70–99)
Glucose-Capillary: 113 mg/dL — ABNORMAL HIGH (ref 70–99)
Glucose-Capillary: 118 mg/dL — ABNORMAL HIGH (ref 70–99)
Glucose-Capillary: 118 mg/dL — ABNORMAL HIGH (ref 70–99)
Glucose-Capillary: 121 mg/dL — ABNORMAL HIGH (ref 70–99)
Glucose-Capillary: 177 mg/dL — ABNORMAL HIGH (ref 70–99)

## 2019-08-09 LAB — BASIC METABOLIC PANEL
Anion gap: 10 (ref 5–15)
BUN: 13 mg/dL (ref 8–23)
CO2: 24 mmol/L (ref 22–32)
Calcium: 8.9 mg/dL (ref 8.9–10.3)
Chloride: 104 mmol/L (ref 98–111)
Creatinine, Ser: 0.94 mg/dL (ref 0.61–1.24)
GFR calc Af Amer: >60 mL/min (ref >60)
GFR calc non Af Amer: >60 mL/min (ref >60)
Glucose, Bld: 121 mg/dL — ABNORMAL HIGH (ref 70–99)
Potassium: 4 mmol/L (ref 3.5–5.1)
Sodium: 138 mmol/L (ref 135–145)

## 2019-08-09 LAB — PHOSPHORUS: Phosphorus: 2.1 mg/dL — ABNORMAL LOW (ref 2.5–4.6)

## 2019-08-09 LAB — VITAMIN B12: Vitamin B-12: 257 pg/mL (ref 180–914)

## 2019-08-09 LAB — MAGNESIUM: Magnesium: 2.1 mg/dL (ref 1.7–2.4)

## 2019-08-09 MED ORDER — DOCUSATE SODIUM 50 MG/5ML PO LIQD
100.0000 mg | Freq: Two times a day (BID) | ORAL | Status: DC
  Administered 2019-08-09 – 2019-08-16 (×15): 100 mg
  Filled 2019-08-09 (×16): qty 10

## 2019-08-09 MED ORDER — OXYCODONE HCL 5 MG PO TABS: 5.0000 mg | ORAL_TABLET | ORAL | Status: DC | PRN

## 2019-08-09 MED ORDER — LEVETIRACETAM 100 MG/ML PO SOLN
500.0000 mg | Freq: Two times a day (BID) | ORAL | Status: AC
  Administered 2019-08-09 – 2019-08-13 (×9): 500 mg
  Filled 2019-08-09 (×9): qty 5

## 2019-08-09 MED ORDER — FOLIC ACID 1 MG PO TABS
1.0000 mg | ORAL_TABLET | Freq: Every day | ORAL | Status: DC
  Administered 2019-08-10 – 2019-08-17 (×8): 1 mg
  Filled 2019-08-09 (×8): qty 1

## 2019-08-09 MED ORDER — THIAMINE HCL 100 MG PO TABS
100.0000 mg | ORAL_TABLET | Freq: Every day | ORAL | Status: DC
  Administered 2019-08-10 – 2019-08-17 (×8): 100 mg
  Filled 2019-08-09 (×8): qty 1

## 2019-08-09 MED ORDER — ADULT MULTIVITAMIN W/MINERALS CH
1.0000 | ORAL_TABLET | Freq: Every day | ORAL | Status: DC
  Administered 2019-08-10 – 2019-08-17 (×8): 1
  Filled 2019-08-09 (×8): qty 1

## 2019-08-09 MED ORDER — ACETAMINOPHEN 325 MG PO TABS
650.0000 mg | ORAL_TABLET | ORAL | Status: DC | PRN
  Administered 2019-08-10 – 2019-08-14 (×5): 650 mg
  Filled 2019-08-09 (×5): qty 2

## 2019-08-09 MED ORDER — SODIUM PHOSPHATES 45 MMOLE/15ML IV SOLN
30.0000 mmol | Freq: Once | INTRAVENOUS | Status: AC
  Administered 2019-08-09: 30 mmol via INTRAVENOUS
  Filled 2019-08-09: qty 10

## 2019-08-09 MED ORDER — FOLIC ACID 1 MG PO TABS: 1.0000 mg | ORAL_TABLET | Freq: Every day | ORAL | Status: DC

## 2019-08-09 NOTE — Progress Notes (Signed)
Trauma/Critical Care Follow Up Note  Subjective:    Overnight Issues:   Objective:  Vital signs for last 24 hours: Temp:  [97.4 F (36.3 C)-98.5 F (36.9 C)] 97.8 F (36.6 C) (04/20 0800) Pulse Rate:  [51-78] 63 (04/20 0800) Resp:  [10-28] 14 (04/20 0800) BP: (107-157)/(57-112) 120/67 (04/20 0800) SpO2:  [92 %-100 %] 100 % (04/20 0800)  Hemodynamic parameters for last 24 hours:    Intake/Output from previous day: 04/19 0701 - 04/20 0700 In: 2283.1 [I.V.:1178.8; NG/GT:854.3; IV Piggyback:250] Out: 400 [Urine:400]  Intake/Output this shift: Total I/O In: 187.5 [I.V.:87.5; IV Piggyback:100] Out: -   Vent settings for last 24 hours:    Physical Exam:  Gen: comfortable, no distress, restrained Neuro: non-focal exam, follows commands HEENT: PERRL Neck: supple CV: RRR Pulm: unlabored breathing Abd: soft, NT GU: clear yellow urine Extr: wwp, no edema   Results for orders placed or performed during the hospital encounter of 08/06/19 (from the past 24 hour(s))  Glucose, capillary     Status: Abnormal   Collection Time: 08/08/19  7:38 PM  Result Value Ref Range   Glucose-Capillary 105 (H) 70 - 99 mg/dL  Glucose, capillary     Status: Abnormal   Collection Time: 08/08/19 11:16 PM  Result Value Ref Range   Glucose-Capillary 154 (H) 70 - 99 mg/dL  Glucose, capillary     Status: Abnormal   Collection Time: 08/09/19  3:26 AM  Result Value Ref Range   Glucose-Capillary 177 (H) 70 - 99 mg/dL  Magnesium     Status: None   Collection Time: 08/09/19  6:41 AM  Result Value Ref Range   Magnesium 2.1 1.7 - 2.4 mg/dL  Phosphorus     Status: Abnormal   Collection Time: 08/09/19  6:41 AM  Result Value Ref Range   Phosphorus 2.1 (L) 2.5 - 4.6 mg/dL  Basic metabolic panel     Status: Abnormal   Collection Time: 08/09/19  6:41 AM  Result Value Ref Range   Sodium 138 135 - 145 mmol/L   Potassium 4.0 3.5 - 5.1 mmol/L   Chloride 104 98 - 111 mmol/L   CO2 24 22 - 32 mmol/L   Glucose, Bld 121 (H) 70 - 99 mg/dL   BUN 13 8 - 23 mg/dL   Creatinine, Ser 7.34 0.61 - 1.24 mg/dL   Calcium 8.9 8.9 - 19.3 mg/dL   GFR calc non Af Amer >60 >60 mL/min   GFR calc Af Amer >60 >60 mL/min   Anion gap 10 5 - 15  Glucose, capillary     Status: Abnormal   Collection Time: 08/09/19  8:37 AM  Result Value Ref Range   Glucose-Capillary 118 (H) 70 - 99 mg/dL   Comment 1 Notify RN    Comment 2 Document in Chart     Assessment & Plan: The plan of care was discussed with the bedside nurse for the day, Susannah, who is in agreement with this plan and no additional concerns were raised.   Present on Admission: . Traumatic brain injury (HCC)    LOS: 3 days   Additional comments:I reviewed the patient's new clinical lab test results.   and I reviewed the patients new imaging test results.    Fall down stairs  TBI/L SDH/SAH- f/u CT stable 4/18, NSGY c/s, Dr. Conchita Paris. Keppra x7d for sz ppx. TBI team therapies. Exam improving. Temporal bone FX - NSGY c/s as above Hyperkalemia- resolved FEN- Cortrak and TF today, SLP eval, check  B12/folate levels as MCV is high, replete hypophophatemia VTE- PAS, LMWH Dispo- 4NP, PT/OT recs for CIR   Jesusita Oka, MD Trauma & General Surgery Please use [email or web address] to contact on call provider  08/09/2019  *Care during the described time interval was provided by me. I have reviewed this patient's available data, including medical history, events of note, physical examination and test results as part of my evaluation.
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
## 2019-08-09 NOTE — Evaluation (Addendum)
Speech Language Pathology Evaluation Patient Details Name: Jeremiah West MRN: [id number] DOB: 1951-02-10 Today's Date: 08/09/2019 Time: 7829-5621 SLP Time Calculation (min) (ACUTE ONLY): 8 min  Problem List:  Patient Active Problem List   Diagnosis Date Noted  . Traumatic brain injury (HCC) 08/06/2019   Past Medical History: No past medical history on file. Past Surgical History:  The histories are not reviewed yet. Please review them in the "History" navigator section and refresh this SmartLink. HPI:  69 year old man who presents 08/06/19 after he fell down a flight of stairs (approximately 15 steps). Intoxicated with etoh level 221, history of alcohol abuse. Lt frontal and temporal SDH, Lt temporal SAH, right temporal bone fracture non-displaced PMHx-Emphysema, CHF with cardiomegaly   Assessment / Plan / Recommendation Clinical Impression  Pt is more alert today, following commands with Min cues but still very disoriented other than to himself. He cannot answer questions with appropriate answers and when given multiple choices to try to increase accuracy, he simply repeats all option. He cannot make choices and can only produce one word in a divergent naming task. His speech is mildly reduced in volume and mild-moderately mumbled, reducing his intelligibility at the phrase level. He presents overall like a Ranchos V (confused, inappropriate) today. He will benefit from ongoing cognitive/communicative therapy.    SLP Assessment  SLP Recommendation/Assessment: Patient needs continued Speech Lanaguage Pathology Services SLP Visit Diagnosis: Cognitive communication deficit (R41.841)    Follow Up Recommendations  Inpatient Rehab    Frequency and Duration min 2x/week  2 weeks      SLP Evaluation Cognition  Overall Cognitive Status: Impaired/Different from baseline Arousal/Alertness: Awake/alert Orientation Level: Oriented to person;Disoriented to place;Disoriented to time;Disoriented  to situation Attention: Sustained Sustained Attention: Impaired Sustained Attention Impairment: Verbal basic Memory: Impaired Memory Impairment: Decreased recall of new information Awareness: Impaired Awareness Impairment: Intellectual impairment Problem Solving: Impaired Problem Solving Impairment: Verbal basic Safety/Judgment: Impaired [place] [employer] Scales of Cognitive Functioning: Confused/inappropriate/non-agitated       Comprehension  Auditory Comprehension Overall Auditory Comprehension: Impaired Yes/No Questions: Impaired Commands: Impaired One Step Basic Commands: 75-100% accurate Conversation: Simple    Expression Expression Primary Mode of Expression: Verbal Verbal Expression Overall Verbal Expression: Impaired Level of Generative/Spontaneous Verbalization: Sentence Naming: Impairment Divergent: 0-24% accurate   Oral / Motor  Oral Motor/Sensory Function Overall Oral Motor/Sensory Function: Generalized oral weakness Motor Speech Overall Motor Speech: Impaired Respiration: Within functional limits Phonation: Low vocal intensity(mild) Articulation: Impaired Level of Impairment: Phrase Intelligibility: Intelligibility reduced Phrase: 50-74% accurate Interfering Components: Inadequate dentition   GO                     Mahala Menghini., M.A. CCC-SLP Acute Rehabilitation Services Pager [phone] Office [phone]  08/09/2019, 11:52 AM

## 2019-08-09 NOTE — Progress Notes (Signed)
  Speech Language Pathology Treatment: Dysphagia  Patient Details Name: BHAVIK CABINESS MRN: [id number] DOB: 01-Mar-1951 Today's Date: 08/09/2019 Time: 9924-2683 SLP Time Calculation (min) (ACUTE ONLY): 14 min  Assessment / Plan / Recommendation Clinical Impression  Pt shows improved alertness and sustained attention to PO trials today, not falling asleep in between boluses as he was on previous date. He has coughing when trying to take a large liquid wash to clear a bite of soft/chopped food. His mastication is also limited by his edentulism and there is generalized weakness that also makes it difficult for him to seal his lips around a cup. Otherwise, he consumes purees and thin liquids, even via large sequential straw sips, without overt s/s of aspiration. Recommend starting Dys 1 diet and thin liquids with full supervision, assisting the pt to self-feed as much as possible.   HPI HPI: 69 year old man who presents 08/06/19 after he fell down a flight of stairs (approximately 15 steps). Intoxicated with etoh level 221, history of alcohol abuse. Lt frontal and temporal SDH, Lt temporal SAH, right temporal bone fracture non-displaced PMHx-Emphysema, CHF with cardiomegaly      SLP Plan  Continue with current plan of care       Recommendations  Diet recommendations: Dysphagia 1 (puree);Thin liquid Liquids provided via: Straw Medication Administration: Crushed with puree Supervision: Staff to assist with self feeding;Full supervision/cueing for compensatory strategies Compensations: Slow rate;Small sips/bites Postural Changes and/or Swallow Maneuvers: Seated upright 90 degrees;Upright 30-60 min after meal                Oral Care Recommendations: Oral care BID Follow up Recommendations: Inpatient Rehab SLP Visit Diagnosis: Dysphagia, unspecified (R13.10) Plan: Continue with current plan of care       GO                 Mahala Menghini., M.A. CCC-SLP Acute Rehabilitation  Services Pager [phone] Office [phone]  08/09/2019, 9:46 AM

## 2019-08-09 NOTE — Progress Notes (Signed)
Report given to 4n07 Rn [place] Citizen. Patient with no complaints at the current time. Will transfer to new room via WC. Patients girlfriend updated about new room.

## 2019-08-10 DIAGNOSIS — E440 Moderate protein-calorie malnutrition: Secondary | ICD-10-CM | POA: Insufficient documentation

## 2019-08-10 LAB — BASIC METABOLIC PANEL
Anion gap: 8 (ref 5–15)
BUN: 14 mg/dL (ref 8–23)
CO2: 27 mmol/L (ref 22–32)
Calcium: 9.5 mg/dL (ref 8.9–10.3)
Chloride: 102 mmol/L (ref 98–111)
Creatinine, Ser: 0.87 mg/dL (ref 0.61–1.24)
GFR calc Af Amer: >60 mL/min (ref >60)
GFR calc non Af Amer: >60 mL/min (ref >60)
Glucose, Bld: 138 mg/dL — ABNORMAL HIGH (ref 70–99)
Potassium: 3.7 mmol/L (ref 3.5–5.1)
Sodium: 137 mmol/L (ref 135–145)

## 2019-08-10 LAB — GLUCOSE, CAPILLARY
Glucose-Capillary: 124 mg/dL — ABNORMAL HIGH (ref 70–99)
Glucose-Capillary: 115 mg/dL — ABNORMAL HIGH (ref 70–99)
Glucose-Capillary: 113 mg/dL — ABNORMAL HIGH (ref 70–99)
Glucose-Capillary: 138 mg/dL — ABNORMAL HIGH (ref 70–99)
Glucose-Capillary: 140 mg/dL — ABNORMAL HIGH (ref 70–99)

## 2019-08-10 LAB — FOLATE RBC
Folate, Hemolysate: 464 ng/mL
Folate, RBC: 1052 ng/mL (ref >498)
Hematocrit: 44.1 % (ref 37.5–51.0)

## 2019-08-10 LAB — CBC
HCT: 39.9 % (ref 39.0–52.0)
Hemoglobin: 13.7 g/dL (ref 13.0–17.0)
MCH: 34.1 pg — ABNORMAL HIGH (ref 26.0–34.0)
MCHC: 34.3 g/dL (ref 30.0–36.0)
MCV: 99.3 fL (ref 80.0–100.0)
Platelets: 182 10*3/uL (ref 150–400)
RBC: 4.02 MIL/uL — ABNORMAL LOW (ref 4.22–5.81)
RDW: 12.2 % (ref 11.5–15.5)
WBC: 6.6 10*3/uL (ref 4.0–10.5)
nRBC: 0 % (ref 0.0–0.2)

## 2019-08-10 LAB — HEMOGLOBIN A1C
Hgb A1c MFr Bld: 5.1 % (ref 4.8–5.6)
Mean Plasma Glucose: 99.67 mg/dL

## 2019-08-10 LAB — PHOSPHORUS: Phosphorus: 3 mg/dL (ref 2.5–4.6)

## 2019-08-10 LAB — MAGNESIUM: Magnesium: 1.9 mg/dL (ref 1.7–2.4)

## 2019-08-10 MED ORDER — OXYCODONE HCL 5 MG PO TABS
5.0000 mg | ORAL_TABLET | ORAL | Status: DC | PRN
  Administered 2019-08-10 – 2019-08-15 (×4): 5 mg
  Filled 2019-08-10 (×4): qty 1

## 2019-08-10 MED ORDER — LORAZEPAM 2 MG/ML IJ SOLN
1.0000 mg | INTRAMUSCULAR | Status: DC | PRN
  Administered 2019-08-11 – 2019-08-12 (×2): 2 mg via INTRAVENOUS
  Filled 2019-08-10 (×2): qty 1

## 2019-08-10 MED ORDER — LORAZEPAM 1 MG PO TABS
1.0000 mg | ORAL_TABLET | ORAL | Status: DC | PRN
  Administered 2019-08-11: 2 mg via ORAL
  Filled 2019-08-10: qty 2

## 2019-08-10 MED ORDER — BISACODYL 10 MG RE SUPP: 10.0000 mg | Freq: Every day | RECTAL | Status: DC | PRN

## 2019-08-10 MED ORDER — HYDROMORPHONE HCL 1 MG/ML IJ SOLN
0.5000 mg | INTRAMUSCULAR | Status: DC | PRN
  Administered 2019-08-17 – 2019-08-18 (×2): 0.5 mg via INTRAVENOUS
  Filled 2019-08-10 (×2): qty 1

## 2019-08-10 MED ORDER — POLYETHYLENE GLYCOL 3350 17 G PO PACK
17.0000 g | PACK | Freq: Every day | ORAL | Status: DC
  Administered 2019-08-10 – 2019-08-16 (×7): 17 g
  Filled 2019-08-10 (×8): qty 1

## 2019-08-10 NOTE — Progress Notes (Signed)
Inpatient Rehabilitation-Admissions Coordinator   Met with pt and his girlfriend [name] at the bedside as follow up from PM&R consult completed by Dr. Courtney Heys on 4/20. Pt is alert and able to accurately answer many of my questions about his prior level of function. Spoke to his girlfriend [name] about anticipated assist levels at DC and she confirmed 24/7 A as needed, stating "whatever it takes." Spoke to pt and [name] about CIR program, expectations and anticipated LOS on rehab. [name] in favor of the program. Provided them with brochures and handouts regarding CIR and will follow up with them tomorrow to see if there are any further questions about the program. Will follow for continued progress with therapies and medical readiness for potential admit to CIR in the next few days.   Raechel Ache, OTR/L  Rehab Admissions Coordinator  [phone] 08/10/2019 11:09 AM

## 2019-08-10 NOTE — NC FL2 (Signed)
[place] LEVEL OF CARE SCREENING TOOL     IDENTIFICATION  Patient Name: ALIE HARDGROVE Birthdate: Oct 01, 1950 Sex: male Admission Date (Current Location): 08/06/2019  [facility] and [place] Number:  Herbalist and Address:  [facility], [place] [address], [place], [place] [address]      Provider Number: [id number]  Attending Physician Name and Address:  Md, Trauma, MD  Relative Name and Phone Number:       Current Level of Care: Hospital Recommended Level of Care: [place] Prior Approval Number:    Date Approved/Denied:   PASRR Number: [id number]  Discharge Plan: SNF    Current Diagnoses: Patient Active Problem List   Diagnosis Date Noted  . Traumatic brain injury ([place]) 08/06/2019    Orientation RESPIRATION BLADDER Height & Weight     Self  Normal Continent Weight: 160 lb 4.4 oz (72.7 kg) Height:  5\' 8"  (172.7 cm)  BEHAVIORAL SYMPTOMS/MOOD NEUROLOGICAL BOWEL NUTRITION STATUS      Continent Diet(See discharge summery)  AMBULATORY STATUS COMMUNICATION OF NEEDS Skin   Extensive Assist   Normal                       Personal Care Assistance Level of Assistance  Feeding, Dressing, Bathing Bathing Assistance: Maximum assistance Feeding assistance: Maximum assistance Dressing Assistance: Maximum assistance     Functional Limitations Info  Sight, Speech Sight Info: Impaired   Speech Info: Impaired(dysarthia)    SPECIAL CARE FACTORS FREQUENCY  PT (By licensed PT), OT (By licensed OT)     PT Frequency: 5x a week OT Frequency: 5x a week            Contractures Contractures Info: Not present    Additional Factors Info  Code Status, Allergies, Insulin Sliding Scale Code Status Info: Full Allergies Info: NKA   Insulin Sliding Scale Info: Lovonog 30 units, every 12 hours       Current Medications (08/10/2019):  This is the current hospital active medication list Current  Facility-Administered Medications  Medication Dose Route Frequency Provider Last Rate Last Admin  . 0.9 %  sodium chloride infusion   Intravenous Continuous Romana Juniper A, MD 50 mL/hr at 08/09/19 0900 Rate Verify at 08/09/19 0900  . acetaminophen (TYLENOL) tablet 650 mg  650 mg Per Tube Q4H PRN Jesusita Oka, MD   650 mg at 08/10/19 0309  . bisacodyl (DULCOLAX) suppository 10 mg  10 mg Rectal Daily PRN Meuth, Brooke A, PA-C      . chlorhexidine (PERIDEX) 0.12 % solution 15 mL  15 mL Mouth Rinse BID Georganna Skeans, MD   15 mL at 08/09/19 2100  . Chlorhexidine Gluconate Cloth 2 % PADS 6 each  6 each Topical Daily Clovis Riley, MD   6 each at 08/10/19 0602  . docusate (COLACE) 50 MG/5ML liquid 100 mg  100 mg Per Tube BID Jesusita Oka, MD   100 mg at 08/09/19 2101  . enoxaparin (LOVENOX) injection 30 mg  30 mg Subcutaneous Q12H Jesusita Oka, MD   30 mg at 08/09/19 2101  . feeding supplement (PIVOT 1.5 CAL) liquid 1,000 mL  1,000 mL Per Tube Continuous Jesusita Oka, MD 55 mL/hr at 08/10/19 0305 1,000 mL at 08/10/19 0305  . folic acid (FOLVITE) tablet 1 mg  1 mg Per Tube Daily Lovick, Montel Culver, MD      . haloperidol lactate (HALDOL) injection 5 mg  5 mg Intravenous Q6H PRN Phylliss Blakes A, MD   5 mg at 08/07/19 2118  . hydrALAZINE (APRESOLINE) injection 10 mg  10 mg Intravenous Q2H PRN Phylliss Blakes A, MD      . HYDROmorphone (DILAUDID) injection 0.5 mg  0.5 mg Intravenous Q4H PRN Meuth, Brooke A, PA-C      . levETIRAcetam (KEPPRA) 100 MG/ML solution 500 mg  500 mg Per Tube BID Diamantina Monks, MD   500 mg at 08/09/19 2101  . MEDLINE mouth rinse  15 mL Mouth Rinse q12n4p Violeta Gelinas, MD   15 mL at 08/09/19 1629  . metoprolol tartrate (LOPRESSOR) injection 5 mg  5 mg Intravenous Q6H PRN Phylliss Blakes A, MD      . multivitamin with minerals tablet 1 tablet  1 tablet Per Tube Daily Diamantina Monks, MD      . ondansetron (ZOFRAN-ODT) disintegrating tablet 4 mg  4 mg Oral  Q6H PRN Phylliss Blakes A, MD       Or  . ondansetron (ZOFRAN) injection 4 mg  4 mg Intravenous Q6H PRN Berna Bue, MD   4 mg at 08/09/19 0157  . oxyCODONE (Oxy IR/ROXICODONE) immediate release tablet 5 mg  5 mg Per Tube Q4H PRN Meuth, Brooke A, PA-C      . polyethylene glycol (MIRALAX / GLYCOLAX) packet 17 g  17 g Per Tube Daily Meuth, Brooke A, PA-C      . thiamine tablet 100 mg  100 mg Per Tube Daily Lovick, Lennie Odor, MD         Discharge Medications: Please see discharge summary for a list of discharge medications.  Relevant Imaging Results:  Relevant Lab Results:   Additional Information SSN: [id number]  Jimmy Picket, [place]

## 2019-08-10 NOTE — Progress Notes (Signed)
Nutrition Follow-up  DOCUMENTATION CODES:   Non-severe (moderate) malnutrition in context of chronic illness  INTERVENTION:  Provide Magic cup TID with meals, each supplement provides 290 kcal and 9 grams of protein.  Continue Pivot 1.5 formula via Cortrak NGT at goal rate of 55 ml/hr.   Tube feeding regimen to provide 1980 kcal (100% of needs), 123 grams of protein, 1003 ml free water.   RD to modify tube feeding regimen once po intake improves.   NUTRITION DIAGNOSIS:   Moderate Malnutrition related to chronic illness(chronic medical issues and/or ETOH abuse) as evidenced by mild fat depletion, moderate muscle depletion; ongoing  GOAL:   Patient will meet greater than or equal to 90% of their needs; met with TF  MONITOR:   PO intake, Supplement acceptance, Diet advancement, Skin, TF tolerance, Weight trends, Labs, I & O's  REASON FOR ASSESSMENT:   Consult Enteral/tube feeding initiation and management  ASSESSMENT:   Pt intoxicated and admitted s/p fall down 15 steps with TBI/L SDH/SAH, and temporal bone fx. Pt with known ETOH abuse with findings of aortic atherosclerosis, emphysema, and CHF/volume overload with cardiomegaly found per MD on exam. Cortrak NGT placed 4/19. Tip of tube in stomach.   Diet has been advanced to a dysphagia 1 diet with thin liquids. SLP following and working with pt on PO progression. Pt confused and only oriented to self. Pt would only consume 4 ounces of applesauce and 2 sips of liquids. RN reports pt refused breakfast this AM. RD to continue current tube feeding regimen which provides 100% of nutrition need. Pt has been tolerating his tube feedings well. Will modify regimen once po intake improves. Magic cup to be provided on all meal trays to aid in po intake.   Labs and medications reviewed.   Diet Order:   Diet Order            DIET - DYS 1 Room service appropriate? Yes; Fluid consistency: Thin  Diet effective now               EDUCATION NEEDS:   No education needs have been identified at this time  Skin:  Skin Assessment: Reviewed RN Assessment  Last BM:  Unknown  Height:   Ht Readings from Last 1 Encounters:  08/06/19 '5\' 8"'  (1.727 m)    Weight:   Wt Readings from Last 1 Encounters:  08/10/19 72.7 kg    BMI:  Body mass index is 24.37 kg/m.  Estimated Nutritional Needs:   Kcal:  1900-2100  Protein:  110-125 grams  Fluid:  2 L/day   Corrin Parker, MS, RD, LDN RD pager number/after hours weekend pager number on Amion.

## 2019-08-10 NOTE — Progress Notes (Addendum)
[facility] Progress Note     Subjective: CC-  Alert and talkative this morning. Oriented only to self and year. States that someone is coming to meet him and he needs to get out of bed. Denies pain. Tolerating tube feedings. Started Dysphagia 1 diet and thin liquids with full supervision yesterday.  Objective: Vital signs in last 24 hours: Temp:  [97.9 F (36.6 C)-99.2 F (37.3 C)] 98 F (36.7 C) (04/21 0300) Pulse Rate:  [64-75] 67 (04/20 2351) Resp:  [18-26] 19 (04/21 0300) BP: (138-162)/(82-101) 138/90 (04/21 0300) SpO2:  [85 %-99 %] 98 % (04/21 0300) Weight:  [72.7 kg] 72.7 kg (04/21 0500) Last BM Date: (PTA)  Intake/Output from previous day: 04/20 0701 - 04/21 0700 In: 2070.7 [P.O.:120; I.V.:607.7; NG/GT:1243; IV Piggyback:100] Out: 725 [Urine:375; Emesis/NG output:350] Intake/Output this shift: No intake/output data recorded.  PE: Gen:  Alert, NAD, restrained HEENT: right eye closed Card:  RRR, no M/G/R heard, 2+ DP pulses Pulm:  CTAB, no W/R/R, rate and effort normal Abd: Soft, protuberant, nontender, +BS Ext:  no BUE/BLE edema, calves soft and nontender Psych: Alert and oriented to person and year Neuro: moving all 4 extremities, follows commands and answer simple questions Skin: no rashes noted, warm and dry  Lab Results:  Recent Labs    08/08/19 0610 08/10/19 0703  WBC 7.5 6.6  HGB 16.0 13.7  HCT 46.2 39.9  PLT 179 182   BMET Recent Labs    08/08/19 0610 08/09/19 0641  NA 140 138  K 3.9 4.0  CL 101 104  CO2 28 24  GLUCOSE 110* 121*  BUN 6* 13  CREATININE 1.04 0.94  CALCIUM 9.0 8.9   PT/INR No results for input(s): LABPROT, INR in the last 72 hours. CMP     Component Value Date/Time   NA 138 08/09/2019 0641   K 4.0 08/09/2019 0641   CL 104 08/09/2019 0641   CO2 24 08/09/2019 0641   GLUCOSE 121 (H) 08/09/2019 0641   BUN 13 08/09/2019 0641   CREATININE 0.94 08/09/2019 0641   CALCIUM 8.9 08/09/2019 0641   PROT 8.4 (H)  08/06/2019 2123   ALBUMIN 4.3 08/06/2019 2123   AST 34 08/06/2019 2123   ALT 32 08/06/2019 2123   ALKPHOS 80 08/06/2019 2123   BILITOT 0.4 08/06/2019 2123   GFRNONAA >60 08/09/2019 0641   GFRAA >60 08/09/2019 0641   Lipase  No results found for: LIPASE     Studies/Results: No results found.  Anti-infectives: Anti-infectives (From admission, onward)   None       Assessment/Plan Fall down stairs  TBI/L SDH/SAH-f/uCT stable 4/18,NSGY c/s,Dr. Conchita Paris. Keppra x7d for sz ppx.TBI team therapies. Exam improving. Temporal bone FX- NSGY c/s as above Hyperkalemia-resolved Hyperglycemia - check A1c Alcohol intoxication - SW for SBIRT FEN- IVF, TF, Dys1 thin liquids diet/ working with SLP. Folate level pending VTE- PAS, LMWH Dispo-Continue PT/OT. CIR following.   LOS: 4 days    Franne Forts, [facility] 08/10/2019, 8:11 AM Please see Amion for pager number during day hours 7:00am-4:30pm

## 2019-08-10 NOTE — Progress Notes (Signed)
Physical Therapy Treatment Patient Details Name: Jeremiah West MRN: [id number] DOB: 03-Aug-1950 Today's Date: 08/10/2019    History of Present Illness 69 year old man who presents 08/06/19 after he fell down a flight of stairs (approximately 15 steps). Intoxicated with etoh level 221, history of alcohol abuse. Lt frontal and temporal SDH, Lt temporal SAH, right temporal bone fracture non-displaced PMHx-Emphysema, CHF with cardiomegaly    PT Comments    Pt is impulsive during session, attempting to get to edge of bed upon PT arrival. PT assists pt in transferring to/from bedside commode and back to bed, pt continues to require significant physical assistance to safely transfer at this time.Once transferring back to bed pt refusing further activity and is asleep within 5 minutes of laying down while PT reapplies restraints. Pt will benefit from continued acute PT POC to improve functional mobility, balance, and safety awareness. Pt remains a good candidate for high intensity inpatient PT services as he was independent prior to admission and demonstrates the potential for significant improvement in mobility.  Follow Up Recommendations  CIR;Supervision/Assistance - 24 hour     Equipment Recommendations  (TBD with progress)    Recommendations for Other Services       Precautions / Restrictions Precautions Precautions: Fall Precaution Comments: in 4 point restraints and mits Restrictions Weight Bearing Restrictions: No    Mobility  Bed Mobility Overal bed mobility: Needs Assistance Bed Mobility: Supine to Sit;Sit to Supine     Supine to sit: Min guard Sit to supine: Min guard      Transfers Overall transfer level: Needs assistance Equipment used: 1 person hand held assist Transfers: Sit to/from [facility] Sit to Stand: Mod assist   Squat pivot transfers: Max assist     General transfer comment: pt transfers to and from bed to bedside  commode  Ambulation/Gait                 Stairs             Wheelchair Mobility    Modified Rankin (Stroke Patients Only) Modified Rankin (Stroke Patients Only) Pre-Morbid Rankin Score: No symptoms Modified Rankin: Moderately severe disability     Balance Overall balance assessment: Needs assistance Sitting-balance support: Single extremity supported;Feet supported Sitting balance-Leahy Scale: Fair Sitting balance - Comments: minG at edge of bed    Standing balance support: Bilateral upper extremity supported Standing balance-Leahy Scale: Zero Standing balance comment: mod-maxA for standing balance during stand pivot transfer                            Cognition Arousal/Alertness: Awake/alert Behavior During Therapy: Impulsive Overall Cognitive Status: Impaired/Different from baseline Area of Impairment: Rancho level;Orientation;Attention;Memory;Following commands;Safety/judgement;Awareness;Problem solving               Rancho Levels of Cognitive Functioning Ranch[place] Amigos Scales of Cognitive Functioning: Confused/inappropriate/non-agitated Orientation Level: Disoriented to;Place;Time;Situation Current Attention Level: Sustained Memory: Decreased recall of precautions;Decreased short-term memory Following Commands: Follows one step commands consistently;Follows multi-step commands with increased time Safety/Judgement: Decreased awareness of safety;Decreased awareness of deficits Awareness: Intellectual Problem Solving: Decreased initiation;Slow processing;Difficulty sequencing;Requires verbal cues;Requires tactile cues        Exercises      General Comments General comments (skin integrity, edema, etc.): VSS on RA, pt impulsive, pt visitor assisting at times during session. Restraints re-applied at end of session      Pertinent Vitals/Pain Pain Assessment: No/denies pain    Home Living  Prior  Function            PT Goals (current goals can now be found in the care plan section) Acute Rehab PT Goals Patient Stated Goal: pt unable to state Progress towards PT goals: Progressing toward goals    Frequency    Min 3X/week      PT Plan Current plan remains appropriate    Co-evaluation              AM-PAC PT "6 Clicks" Mobility   Outcome Measure  Help needed turning from your back to your side while in a flat bed without using bedrails?: A Little Help needed moving from lying on your back to sitting on the side of a flat bed without using bedrails?: A Little Help needed moving to and from a bed to a chair (including a wheelchair)?: A Lot Help needed standing up from a chair using your arms (e.g., wheelchair or bedside chair)?: A Lot Help needed to walk in hospital room?: Total Help needed climbing 3-5 steps with a railing? : Total 6 Click Score: 12    End of Session   Activity Tolerance: Patient tolerated treatment well Patient left: in bed;with call bell/phone within reach;with bed alarm set;with family/visitor present;with restraints reapplied Nurse Communication: Mobility status PT Visit Diagnosis: History of falling (Z91.81);Other symptoms and signs involving the nervous system (R29.898)     Time: 2637-8588 PT Time Calculation (min) (ACUTE ONLY): 18 min  Charges:  $Therapeutic Activity: 8-22 mins                     Arlyss Gandy, PT, DPT Acute Rehabilitation Pager: [phone]    Arlyss Gandy 08/10/2019, 5:12 PM

## 2019-08-10 NOTE — Progress Notes (Signed)
RN entered room to find pt's right arm and bedding covered in blood. Pt's IV had become dislodged despite soft wrist restraints, posey belt, and mitts. IV saved and re-dressed. Pt and bedding cleaned up.   At the same time, pt's tele monitor reading hr as low as 40's and 30's but with no established rhythm showing on monitor. All electrodes changed. RN listened to pt's hr apically for 1 full minute. HR 56 and regular. Trauma PA, Brooke, paged and notified of event.

## 2019-08-10 NOTE — TOC Initial Note (Signed)
Transition of Care [facility]) - Initial/Assessment Note    Patient Details  Name: Jeremiah West MRN: [id number] Date of Birth: 1950/11/08  Transition of Care [facility] CM/SW Contact:    Jimmy Picket, [place] Phone Number: 08/10/2019, 10:30 AM  Clinical Narrative:                  CSW spoke to sister Jeremiah West [phone]) by phone. CSW introduced herself and explained her role. [name] stated the PTA pt was living at home with his fiance [name]. Pt was independent with his ADL's. [name] stated that that after pt completes CIR he will be going back to his home with his fiance and she will be able to provided the needed care and supervision.   CSW and [name] discussed the importance of having a back up plan to CIR. [name] stated that she is fine pursuing SNF placements as a back ground. Sharonstated she is fine with CSW sending his information out to snfs in the area. CSW informed [name] that after we receive acceptances she will present a list to her to choose from.  [name] also stated that we have permission to also involve pts fiance [name] in the decision making process.   CSW will continue to follow.   Expected Discharge Plan: IP Rehab Facility Barriers to Discharge: Continued Medical Work up   Patient Goals and CMS Choice Patient states their goals for this hospitalization and ongoing recovery are:: Pt is unable to verbalize goals. CMS Medicare.gov Compare Post Acute Care list provided to:: Patient Choice offered to / list presented to : Patient, Sibling  Expected Discharge Plan and Services Expected Discharge Plan: IP Rehab Facility       Living arrangements for the past 2 months: Single Family Home                                      Prior Living Arrangements/Services Living arrangements for the past 2 months: Single Family Home Lives with:: Significant Other Patient language and need for interpreter reviewed:: Yes        Need for Family Participation in  Patient Care: Yes (Comment) Care giver support system in place?: Yes (comment)   Criminal Activity/Legal Involvement Pertinent to Current Situation/Hospitalization: No - Comment as needed  Activities of Daily Living   ADL Screening (condition at time of admission) Patient's cognitive ability adequate to safely complete daily activities?: No Is the patient deaf or have difficulty hearing?: No Does the patient have difficulty seeing, even when wearing glasses/contacts?: Yes Does the patient have difficulty concentrating, remembering, or making decisions?: Yes Patient able to express need for assistance with ADLs?: No Does the patient have difficulty dressing or bathing?: Yes Independently performs ADLs?: No Communication: Dependent Dressing (OT): Dependent Grooming: Dependent Feeding: Dependent Bathing: Dependent Toileting: Dependent In/Out Bed: Dependent Walks in Home: Dependent Does the patient have difficulty walking or climbing stairs?: Yes Weakness of Legs: None Weakness of Arms/Hands: None  Permission Sought/Granted Permission sought to share information with : Family Supports    Share Information with NAME: Jeremiah West  Permission granted to share info w AGENCY: SNF  Permission granted to share info w Relationship: sister  Permission granted to share info w Contact Information: [phone]  Emotional Assessment Appearance:: Appears stated age Attitude/Demeanor/Rapport: Unable to Assess Affect (typically observed): Unable to Assess Orientation: : Oriented to Self Alcohol / Substance Use: Alcohol Use  Admission diagnosis:  Trauma [T14.90XA] Brain bleed [facility]) [I61.9] Traumatic brain injury [facility]) [Y56.3S9H] Chest pain [T34.2] Alcoholic intoxication without complication ([place]) [A76.811] Open fracture of temporal bone, initial encounter [facility]) [S02.19XB] Patient Active Problem List   Diagnosis Date Noted  . Traumatic brain injury ([place]) 08/06/2019   PCP:   Patient, No Pcp Per Pharmacy:   [facility] [place] [place] - [address] [place] [place] [address] Phone: [phone] Fax: [phone]     Social Determinants of Health (SDOH) Interventions    Readmission Risk Interventions No flowsheet data found.  Emeterio Reeve, Latanya Presser, [place] Social Worker [phone]

## 2019-08-10 NOTE — Progress Notes (Signed)
  Speech Language Pathology Treatment: Dysphagia;Cognitive-Linquistic  Patient Details Name: Jeremiah West MRN: [id number] DOB: 1950/10/27 Today's Date: 08/10/2019 Time: 7062-3762 SLP Time Calculation (min) (ACUTE ONLY): 15 min  Assessment / Plan / Recommendation Clinical Impression  Pt upright in bed for session, presenting as a Ranchos level V (confused/non-agitated). He is oriented to person, but disoriented to place/situation. He continues to present with STM deficits. He was unable to recall information immediately following questions, and with a 2-3 min delay, despite Mod-Max cues. Pt was willing to consume a cup of applesauce (4oz) and ~2 sips of thin liquids, before stating he would not accept any other POs, despite consistent Max cues for encouragement. He was not noted with any s/sx of dysphagia or aspiration. Given pt's fluctuating mentation and alert state, he remains appropriate for current diet. Will continue to follow for PO progression and cognitive tx.   HPI HPI: 69 year old man who presents 08/06/19 after he fell down a flight of stairs (approximately 15 steps). Intoxicated with etoh level 221, history of alcohol abuse. Lt frontal and temporal SDH, Lt temporal SAH, right temporal bone fracture non-displaced PMHx-Emphysema, CHF with cardiomegaly      SLP Plan  Continue with current plan of care       Recommendations  Diet recommendations: Dysphagia 1 (puree);Thin liquid Liquids provided via: Straw;Cup Medication Administration: Crushed with puree Supervision: Staff to assist with self feeding;Full supervision/cueing for compensatory strategies Compensations: Slow rate;Small sips/bites Postural Changes and/or Swallow Maneuvers: Seated upright 90 degrees;Upright 30-60 min after meal                Oral Care Recommendations: Oral care BID Follow up Recommendations: Inpatient Rehab SLP Visit Diagnosis: Cognitive communication deficit (R41.841);Dysphagia, unspecified  (R13.10) Plan: Continue with current plan of care       GO               Maudry Mayhew, Student SLP Office: [phone]  08/10/2019, 9:19 AM
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
## 2019-08-11 LAB — GLUCOSE, CAPILLARY
Glucose-Capillary: 100 mg/dL — ABNORMAL HIGH (ref 70–99)
Glucose-Capillary: 102 mg/dL — ABNORMAL HIGH (ref 70–99)
Glucose-Capillary: 136 mg/dL — ABNORMAL HIGH (ref 70–99)
Glucose-Capillary: 88 mg/dL (ref 70–99)
Glucose-Capillary: 98 mg/dL (ref 70–99)
Glucose-Capillary: 99 mg/dL (ref 70–99)

## 2019-08-11 LAB — CBC
HCT: 42.2 % (ref 39.0–52.0)
Hemoglobin: 14.5 g/dL (ref 13.0–17.0)
MCH: 34.1 pg — ABNORMAL HIGH (ref 26.0–34.0)
MCHC: 34.4 g/dL (ref 30.0–36.0)
MCV: 99.3 fL (ref 80.0–100.0)
Platelets: 200 10*3/uL (ref 150–400)
RBC: 4.25 MIL/uL (ref 4.22–5.81)
RDW: 12.3 % (ref 11.5–15.5)
WBC: 4.7 10*3/uL (ref 4.0–10.5)
nRBC: 0 % (ref 0.0–0.2)

## 2019-08-11 LAB — BASIC METABOLIC PANEL
Anion gap: 10 (ref 5–15)
BUN: 14 mg/dL (ref 8–23)
CO2: 28 mmol/L (ref 22–32)
Calcium: 9.9 mg/dL (ref 8.9–10.3)
Chloride: 101 mmol/L (ref 98–111)
Creatinine, Ser: 0.98 mg/dL (ref 0.61–1.24)
GFR calc Af Amer: >60 mL/min (ref >60)
GFR calc non Af Amer: >60 mL/min (ref >60)
Glucose, Bld: 124 mg/dL — ABNORMAL HIGH (ref 70–99)
Potassium: 3.4 mmol/L — ABNORMAL LOW (ref 3.5–5.1)
Sodium: 139 mmol/L (ref 135–145)

## 2019-08-11 LAB — PHOSPHORUS: Phosphorus: 3.7 mg/dL (ref 2.5–4.6)

## 2019-08-11 LAB — MAGNESIUM: Magnesium: 2 mg/dL (ref 1.7–2.4)

## 2019-08-11 MED ORDER — POTASSIUM CHLORIDE 20 MEQ PO PACK
40.0000 meq | PACK | Freq: Two times a day (BID) | ORAL | Status: AC
  Administered 2019-08-11 (×2): 40 meq
  Filled 2019-08-11 (×2): qty 2

## 2019-08-11 NOTE — Progress Notes (Signed)
Physical Therapy Treatment Patient Details Name: Jeremiah West MRN: [id number] DOB: 31-Dec-1950 Today's Date: 08/11/2019    History of Present Illness 69 year old man who presents 08/06/19 after he fell down a flight of stairs (approximately 15 steps). Intoxicated with etoh level 221, history of alcohol abuse. Lt frontal and temporal SDH, Lt temporal SAH, right temporal bone fracture non-displaced PMHx-Emphysema, CHF with cardiomegaly    PT Comments    Pt calm today and following single step commands. Pt performed sit<>stand from knee height with min A and higher surface with min-guard A, >10x. Performed pregait activities in standing within stedy with min A and UE support. Was able to step L foot but had difficulty picking up R, leaned L out of base of support to achieve foot lift. Continue to recommend CIR level therapy in post acute setting. PT will continue to follow.    Follow Up Recommendations  CIR;Supervision/Assistance - 24 hour     Equipment Recommendations  (TBD with progress)    Recommendations for Other Services       Precautions / Restrictions Precautions Precautions: Fall Precaution Comments: in 4 point restraints and mits Restrictions Weight Bearing Restrictions: No    Mobility  Bed Mobility Overal bed mobility: Needs Assistance Bed Mobility: Supine to Sit;Sit to Supine     Supine to sit: Min assist Sit to supine: Min guard   General bed mobility comments: min A for elevation of trunk from mattress into sitting position. Pt able to lift LE's into bed and then bridge to scoot with vc's  Transfers Overall transfer level: Needs assistance Equipment used: Ambulation equipment used Transfers: Sit to/from Stand Sit to Stand: Min assist         General transfer comment: practiced sit<>stand numerous times from low surface and heigher surface of flaps. Min-guard A from flaps, min from bed.   Ambulation/Gait             General Gait Details:  performed pregait activities in standing in stedy. Able to lift LLE easily but had difficulty with R. Was able to achieve with increased L lean and strong hold on stedy. Improved with practice   Stairs             Wheelchair Mobility    Modified Rankin (Stroke Patients Only) Modified Rankin (Stroke Patients Only) Pre-Morbid Rankin Score: No symptoms Modified Rankin: Moderately severe disability     Balance Overall balance assessment: Needs assistance Sitting-balance support: Single extremity supported;Feet supported Sitting balance-Leahy Scale: Fair Sitting balance - Comments: minG at edge of bed  Postural control: Left lateral lean;Posterior lean Standing balance support: Bilateral upper extremity supported Standing balance-Leahy Scale: Poor Standing balance comment: maintained standing with UE support and min A                            Cognition Arousal/Alertness: Awake/alert Behavior During Therapy: WFL for tasks assessed/performed Overall Cognitive Status: Impaired/Different from baseline Area of Impairment: Rancho level;Orientation;Attention;Memory;Following commands;Safety/judgement;Awareness;Problem solving               Rancho Levels of Cognitive Functioning [place] Scales of Cognitive Functioning: Confused/inappropriate/non-agitated Orientation Level: Disoriented to;Place;Time;Situation Current Attention Level: Sustained Memory: Decreased recall of precautions;Decreased short-term memory Following Commands: Follows one step commands consistently;Follows multi-step commands with increased time Safety/Judgement: Decreased awareness of safety;Decreased awareness of deficits Awareness: Intellectual Problem Solving: Decreased initiation;Slow processing;Difficulty sequencing;Requires verbal cues;Requires tactile cues General Comments: pt calmer today and following commands, minimal verbalization  initiated by him. Did leave lines alone once  mitts were removed      Exercises Other Exercises Other Exercises: performed reaching activities with trunk rotation, high and low    General Comments General comments (skin integrity, edema, etc.): HR 77 bpm, SpO2 100% on RA      Pertinent Vitals/Pain Pain Assessment: No/denies pain    Home Living                      Prior Function            PT Goals (current goals can now be found in the care plan section) Acute Rehab PT Goals Patient Stated Goal: have mitts off PT Goal Formulation: Patient unable to participate in goal setting Time For Goal Achievement: 08/21/19 Potential to Achieve Goals: Fair Progress towards PT goals: Progressing toward goals    Frequency    Min 3X/week      PT Plan Current plan remains appropriate    Co-evaluation              AM-PAC PT "6 Clicks" Mobility   Outcome Measure  Help needed turning from your back to your side while in a flat bed without using bedrails?: A Little Help needed moving from lying on your back to sitting on the side of a flat bed without using bedrails?: A Little Help needed moving to and from a bed to a chair (including a wheelchair)?: A Lot Help needed standing up from a chair using your arms (e.g., wheelchair or bedside chair)?: A Lot Help needed to walk in hospital room?: Total Help needed climbing 3-5 steps with a railing? : Total 6 Click Score: 12    End of Session   Activity Tolerance: Patient tolerated treatment well Patient left: in bed;with call bell/phone within reach;with bed alarm set;with restraints reapplied Nurse Communication: Mobility status PT Visit Diagnosis: History of falling (Z91.81);Other symptoms and signs involving the nervous system (R29.898)     Time: 5956-3875 PT Time Calculation (min) (ACUTE ONLY): 29 min  Charges:  $Gait Training: 8-22 mins $Therapeutic Exercise: 8-22 mins                     Leighton Roach, [place]  Pager  [phone] Office [place] 08/11/2019, 2:04 PM

## 2019-08-11 NOTE — Progress Notes (Signed)
At 2000 when the patient's visitors left, the patient was agitated, impulsive, trying to get out of bed and physically aggressive while RN performed her assessment. CIWA score of 15 noted, with no PRN Ativan in MAR. RN administered Haldol, which calmed the patient down. MD entered orders for PRN Ativan, but it was no longer necessary as the patient was sleeping. The patient has just been transferred from 4NP07 to 4NP02, which is closer to the nurses station; this is ideal given the patient's confusion, agitation, and impulsive nature. The patient is now asleep in his new room.

## 2019-08-11 NOTE — Progress Notes (Signed)
[facility] Progress Note     Subjective: CC-  Patient became increasingly agitated yesterday evening and last night. Appears calm this morning. Received haldol once at 2110.  Objective: Vital signs in last 24 hours: Temp:  [97.5 F (36.4 C)-98.6 F (37 C)] 98.6 F (37 C) (04/22 0735) Pulse Rate:  [50-72] 50 (04/22 0735) Resp:  [15-30] 17 (04/22 0735) BP: (143-169)/(57-88) 153/68 (04/22 0735) SpO2:  [95 %-99 %] 95 % (04/22 0735) Weight:  [72.8 kg] 72.8 kg (04/22 0500) Last BM Date: (PTA)  Intake/Output from previous day: 04/21 0701 - 04/22 0700 In: 100 [P.O.:100] Out: 800 [Urine:800] Intake/Output this shift: Total I/O In: -  Out: 400 [Urine:400]  PE: Gen:  Alert, NAD, restrained HEENT: right eye closed Card:  RRR, no M/G/R heard, 2+ DP pulses Pulm:  CTAB, no W/R/R, rate and effort normal Abd: Soft, protuberant, nontender, +BS Ext:  no BUE/BLE edema, calves soft and nontender Psych: Alert and oriented only to self, replies "uh huh" to most questions Neuro: moving all 4 extremities, follows simple commands Skin: no rashes noted, warm and dry  Lab Results:  Recent Labs    08/10/19 0703 08/11/19 0645  WBC 6.6 4.7  HGB 13.7 14.5  HCT 39.9 42.2  PLT 182 200   BMET Recent Labs    08/10/19 0703 08/11/19 0645  NA 137 139  K 3.7 3.4*  CL 102 101  CO2 27 28  GLUCOSE 138* 124*  BUN 14 14  CREATININE 0.87 0.98  CALCIUM 9.5 9.9   PT/INR No results for input(s): LABPROT, INR in the last 72 hours. CMP     Component Value Date/Time   NA 139 08/11/2019 0645   K 3.4 (L) 08/11/2019 0645   CL 101 08/11/2019 0645   CO2 28 08/11/2019 0645   GLUCOSE 124 (H) 08/11/2019 0645   BUN 14 08/11/2019 0645   CREATININE 0.98 08/11/2019 0645   CALCIUM 9.9 08/11/2019 0645   PROT 8.4 (H) 08/06/2019 2123   ALBUMIN 4.3 08/06/2019 2123   AST 34 08/06/2019 2123   ALT 32 08/06/2019 2123   ALKPHOS 80 08/06/2019 2123   BILITOT 0.4 08/06/2019 2123   GFRNONAA >60  08/11/2019 0645   GFRAA >60 08/11/2019 0645   Lipase  No results found for: LIPASE     Studies/Results: No results found.  Anti-infectives: Anti-infectives (From admission, onward)   None       Assessment/Plan Fall down stairs  TBI/L SDH/SAH-f/uCT stable 4/18,NSGY c/s,Dr. Conchita Paris. Keppra x7d for sz ppx.TBI team therapies Temporal bone FX- NSGY c/s as above Hypokalemia-replace K and repeat labs tomorrow Alcohol intoxication - SW for SBIRT, CIWA FEN- IVF, TF, Dys1 thin liquids diet/ working with SLP VTE- PAS,LMWH Dispo-Continue PT/OT. CIR following.    LOS: 5 days    Franne Forts, [facility] 08/11/2019, 8:48 AM Please see Amion for pager number during day hours 7:00am-4:30pm

## 2019-08-11 NOTE — Progress Notes (Signed)
Inpatient Rehabilitation-Admissions Coordinator   I have initiated insurance authorization process for possible admit. Will follow up once there has been a determination.   Cheri Rous, OTR/L  Rehab Admissions Coordinator  [phone] 08/11/2019 2:24 PM

## 2019-08-12 ENCOUNTER — Inpatient Hospital Stay (HOSPITAL_COMMUNITY): Payer: Medicare Other

## 2019-08-12 LAB — BASIC METABOLIC PANEL
Anion gap: 7 (ref 5–15)
BUN: 13 mg/dL (ref 8–23)
CO2: 26 mmol/L (ref 22–32)
Calcium: 9.4 mg/dL (ref 8.9–10.3)
Chloride: 101 mmol/L (ref 98–111)
Creatinine, Ser: 0.84 mg/dL (ref 0.61–1.24)
GFR calc Af Amer: >60 mL/min (ref >60)
GFR calc non Af Amer: >60 mL/min (ref >60)
Glucose, Bld: 147 mg/dL — ABNORMAL HIGH (ref 70–99)
Potassium: 3.8 mmol/L (ref 3.5–5.1)
Sodium: 134 mmol/L — ABNORMAL LOW (ref 135–145)

## 2019-08-12 LAB — GLUCOSE, CAPILLARY
Glucose-Capillary: 146 mg/dL — ABNORMAL HIGH (ref 70–99)
Glucose-Capillary: 141 mg/dL — ABNORMAL HIGH (ref 70–99)
Glucose-Capillary: 144 mg/dL — ABNORMAL HIGH (ref 70–99)
Glucose-Capillary: 128 mg/dL — ABNORMAL HIGH (ref 70–99)
Glucose-Capillary: 110 mg/dL — ABNORMAL HIGH (ref 70–99)
Glucose-Capillary: 127 mg/dL — ABNORMAL HIGH (ref 70–99)

## 2019-08-12 LAB — CBC
HCT: 41 % (ref 39.0–52.0)
Hemoglobin: 14.2 g/dL (ref 13.0–17.0)
MCH: 34.2 pg — ABNORMAL HIGH (ref 26.0–34.0)
MCHC: 34.6 g/dL (ref 30.0–36.0)
MCV: 98.8 fL (ref 80.0–100.0)
Platelets: 201 10*3/uL (ref 150–400)
RBC: 4.15 MIL/uL — ABNORMAL LOW (ref 4.22–5.81)
RDW: 12 % (ref 11.5–15.5)
WBC: 5.6 10*3/uL (ref 4.0–10.5)
nRBC: 0 % (ref 0.0–0.2)

## 2019-08-12 LAB — MAGNESIUM: Magnesium: 2.1 mg/dL (ref 1.7–2.4)

## 2019-08-12 MED ORDER — CLONAZEPAM 0.5 MG PO TABS: 0.5000 mg | ORAL_TABLET | Freq: Two times a day (BID) | ORAL | Status: DC

## 2019-08-12 MED ORDER — CLONAZEPAM 0.5 MG PO TABS
0.5000 mg | ORAL_TABLET | Freq: Two times a day (BID) | ORAL | Status: DC
  Administered 2019-08-12 – 2019-08-14 (×5): 0.5 mg via ORAL
  Filled 2019-08-12 (×5): qty 1

## 2019-08-12 MED ORDER — LORAZEPAM 2 MG/ML IJ SOLN: 1.0000 mg | INTRAMUSCULAR | Status: DC | PRN

## 2019-08-12 NOTE — Progress Notes (Signed)
Patient much more awake and alert this afternoon. Knows his name and asked for his sister. I told him I spoke with [name] earlier and she was going to come visit him soon. Patient was happy. Will continue to monitor.

## 2019-08-12 NOTE — Progress Notes (Signed)
[facility] Progress Note     Subjective: CC-  Lethargic and unable to answer questions this morning. Per PT notes yesterday he was calm and following single step commands. He did receive 2mg  ativan yesterday at 1630 and 2100, and again this morning at 0141. BM x2 yesterday.  Objective: Vital signs in last 24 hours: Temp:  [97.6 F (36.4 C)-99.4 F (37.4 C)] 97.6 F (36.4 C) (04/23 0759) Pulse Rate:  [59-90] 66 (04/23 0759) Resp:  [15-36] 36 (04/23 0759) BP: (145-174)/(55-95) 145/78 (04/23 0759) SpO2:  [93 %-99 %] 98 % (04/23 0759) Weight:  [71.3 kg] 71.3 kg (04/23 0500) Last BM Date: (PTA)  Intake/Output from previous day: 04/22 0701 - 04/23 0700 In: 1555 [P.O.:690; NG/GT:865] Out: 2075 [Urine:2075] Intake/Output this shift: Total I/O In: -  Out: 400 [Urine:400]  PE: Gen: Lethargic, NAD, restrained HEENT:right eye closed Card: RRR, no M/G/R heard, 2+ DP pulses Pulm: CTAB, no W/R/R, rate and effort normal Abd: Soft,protuberant, nontender, +BS Ext: no BUE/BLE edema, calves soft and nontender Psych:unable to answer orientation questions or follow commands this morning Neuro: moving all 4 extremities Skin: no rashes noted, warm and dry   Lab Results:  Recent Labs    08/11/19 0645 08/12/19 0619  WBC 4.7 5.6  HGB 14.5 14.2  HCT 42.2 41.0  PLT 200 201   BMET Recent Labs    08/11/19 0645 08/12/19 0619  NA 139 134*  K 3.4* 3.8  CL 101 101  CO2 28 26  GLUCOSE 124* 147*  BUN 14 13  CREATININE 0.98 0.84  CALCIUM 9.9 9.4   PT/INR No results for input(s): LABPROT, INR in the last 72 hours. CMP     Component Value Date/Time   NA 134 (L) 08/12/2019 0619   K 3.8 08/12/2019 0619   CL 101 08/12/2019 0619   CO2 26 08/12/2019 0619   GLUCOSE 147 (H) 08/12/2019 0619   BUN 13 08/12/2019 0619   CREATININE 0.84 08/12/2019 0619   CALCIUM 9.4 08/12/2019 0619   PROT 8.4 (H) 08/06/2019 2123   ALBUMIN 4.3 08/06/2019 2123   AST 34 08/06/2019 2123    ALT 32 08/06/2019 2123   ALKPHOS 80 08/06/2019 2123   BILITOT 0.4 08/06/2019 2123   GFRNONAA >60 08/12/2019 0619   GFRAA >60 08/12/2019 0619   Lipase  No results found for: LIPASE     Studies/Results: No results found.  Anti-infectives: Anti-infectives (From admission, onward)   None       Assessment/Plan Fall down stairs  TBI/L SDH/SAH-f/uCT stable 4/18,NSGY c/s,Dr. 08/14/2019. Keppra x7d for sz ppx.TBI team therapies Temporal bone FX- NSGY c/s as above Hypokalemia-resolved Hyperglycemia - mild, A1c 5.1 Alcohol intoxication - SW for SBIRT, CIWA FEN-IVF,TF, Dys1 thin liquids diet/ working with SLP - advance per speech VTE- PAS,LMWH Foley - none Follow up - NS Dispo-ContinuePT/OT. CIR following. Check CXR for tachypnea and O2 requirements over night. Lethargic this morning which is different than yesterday, likely medication induced as he received ativan twice over night, if this does not improve this morning once meds wear off may need to consider repeat head CT.    LOS: 6 days    Conchita Paris, [facility] 08/12/2019, 8:26 AM Please see Amion for pager number during day hours 7:00am-4:30pm

## 2019-08-12 NOTE — Progress Notes (Signed)
Spoke with patient's sister, Agustin Cree and updated on patient status. [name] said she would be here to visit patient this evening.

## 2019-08-12 NOTE — Plan of Care (Signed)
Continue to monitor

## 2019-08-12 NOTE — Progress Notes (Addendum)
Inpatient Rehabilitation Admissions Coordinator  Jeremiah West has requested peer to peer with treating MD/PA. I have notified Tresa Endo, [place] with Trauma of request.  Ottie Glazier, RN, MSN Rehab Admissions Coordinator [phone] 08/12/2019 1:08 PM   I received a call from [employer] to request we withdraw our insurance authorization until patient no longer in withdrawal per Dr. Bedelia Person. We will follow up on Monday.  Ottie Glazier, RN, MSN Rehab Admissions Coordinator [phone] 08/12/2019 3:45 PM

## 2019-08-13 LAB — BASIC METABOLIC PANEL
Anion gap: 7 (ref 5–15)
BUN: 16 mg/dL (ref 8–23)
CO2: 29 mmol/L (ref 22–32)
Calcium: 9.7 mg/dL (ref 8.9–10.3)
Chloride: 101 mmol/L (ref 98–111)
Creatinine, Ser: 0.92 mg/dL (ref 0.61–1.24)
GFR calc Af Amer: >60 mL/min (ref >60)
GFR calc non Af Amer: >60 mL/min (ref >60)
Glucose, Bld: 131 mg/dL — ABNORMAL HIGH (ref 70–99)
Potassium: 4.3 mmol/L (ref 3.5–5.1)
Sodium: 137 mmol/L (ref 135–145)

## 2019-08-13 LAB — GLUCOSE, CAPILLARY
Glucose-Capillary: 105 mg/dL — ABNORMAL HIGH (ref 70–99)
Glucose-Capillary: 118 mg/dL — ABNORMAL HIGH (ref 70–99)
Glucose-Capillary: 121 mg/dL — ABNORMAL HIGH (ref 70–99)
Glucose-Capillary: 125 mg/dL — ABNORMAL HIGH (ref 70–99)
Glucose-Capillary: 126 mg/dL — ABNORMAL HIGH (ref 70–99)
Glucose-Capillary: 147 mg/dL — ABNORMAL HIGH (ref 70–99)

## 2019-08-13 LAB — CBC
HCT: 43.9 % (ref 39.0–52.0)
Hemoglobin: 14.8 g/dL (ref 13.0–17.0)
MCH: 34.1 pg — ABNORMAL HIGH (ref 26.0–34.0)
MCHC: 33.7 g/dL (ref 30.0–36.0)
MCV: 101.2 fL — ABNORMAL HIGH (ref 80.0–100.0)
Platelets: 213 10*3/uL (ref 150–400)
RBC: 4.34 MIL/uL (ref 4.22–5.81)
RDW: 12.3 % (ref 11.5–15.5)
WBC: 6 10*3/uL (ref 4.0–10.5)
nRBC: 0 % (ref 0.0–0.2)

## 2019-08-13 LAB — MAGNESIUM: Magnesium: 2.2 mg/dL (ref 1.7–2.4)

## 2019-08-13 NOTE — Progress Notes (Signed)
Patient ID: Jeremiah West, male   DOB: 14-May-1950, 69 y.o.   MRN: [id number] [facility] Progress Note:   * No surgery found *  Subjective: Mental status is talkative but of questionable orientation.  Complaints nothing specific. Objective: Vital signs in last 24 hours: Temp:  [98.5 F (36.9 C)-99.3 F (37.4 C)] 99 F (37.2 C) (04/24 0725) Pulse Rate:  [25-83] 70 (04/24 0725) Resp:  [19-29] 20 (04/24 0725) BP: (131-156)/(67-87) 146/67 (04/24 0725) SpO2:  [94 %-100 %] 94 % (04/24 0725) Weight:  [72.3 kg] 72.3 kg (04/24 0500)  Intake/Output from previous day: 04/23 0701 - 04/24 0700 In: 967 [P.O.:967] Out: 1400 [Urine:1400] Intake/Output this shift: No intake/output data recorded.  Physical Exam: Work of breathing is not labored.  Restrained with feeding tube in nose.  Tray beside with clears to drink with assistance.   Lab Results:  Results for orders placed or performed during the hospital encounter of 08/06/19 (from the past 48 hour(s))  Glucose, capillary     Status: None   Collection Time: 08/11/19 11:33 AM  Result Value Ref Range   Glucose-Capillary 98 70 - 99 mg/dL    Comment: Glucose reference range applies only to samples taken after fasting for at least 8 hours.  Glucose, capillary     Status: Abnormal   Collection Time: 08/11/19  4:01 PM  Result Value Ref Range   Glucose-Capillary 102 (H) 70 - 99 mg/dL    Comment: Glucose reference range applies only to samples taken after fasting for at least 8 hours.  Glucose, capillary     Status: Abnormal   Collection Time: 08/11/19  8:13 PM  Result Value Ref Range   Glucose-Capillary 100 (H) 70 - 99 mg/dL    Comment: Glucose reference range applies only to samples taken after fasting for at least 8 hours.  Glucose, capillary     Status: Abnormal   Collection Time: 08/11/19 11:43 PM  Result Value Ref Range   Glucose-Capillary 146 (H) 70 - 99 mg/dL    Comment: Glucose reference range applies only to samples  taken after fasting for at least 8 hours.  Glucose, capillary     Status: Abnormal   Collection Time: 08/12/19  3:39 AM  Result Value Ref Range   Glucose-Capillary 141 (H) 70 - 99 mg/dL    Comment: Glucose reference range applies only to samples taken after fasting for at least 8 hours.  CBC     Status: Abnormal   Collection Time: 08/12/19  6:19 AM  Result Value Ref Range   WBC 5.6 4.0 - 10.5 K/uL   RBC 4.15 (L) 4.22 - 5.81 MIL/uL   Hemoglobin 14.2 13.0 - 17.0 g/dL   HCT 41.0 39.0 - 52.0 %   MCV 98.8 80.0 - 100.0 fL   MCH 34.2 (H) 26.0 - 34.0 pg   MCHC 34.6 30.0 - 36.0 g/dL   RDW 12.0 11.5 - 15.5 %   Platelets 201 150 - 400 K/uL   nRBC 0.0 0.0 - 0.2 %    Comment: Performed at [facility], [place] [address] [place],  [address]  Basic metabolic panel     Status: Abnormal   Collection Time: 08/12/19  6:19 AM  Result Value Ref Range   Sodium 134 (L) 135 - 145 mmol/L   Potassium 3.8 3.5 - 5.1 mmol/L   Chloride 101 98 - 111 mmol/L   CO2 26 22 - 32 mmol/L   Glucose, Bld 147 (H) 70 -  99 mg/dL    Comment: Glucose reference range applies only to samples taken after fasting for at least 8 hours.   BUN 13 8 - 23 mg/dL   Creatinine, Ser 0.17 0.61 - 1.24 mg/dL   Calcium 9.4 8.9 - 51.0 mg/dL   GFR calc non Af Amer >60 >60 mL/min   GFR calc Af Amer >60 >60 mL/min   Anion gap 7 5 - 15    Comment: Performed at [facility], [address], [place], [place] [address]  Magnesium     Status: None   Collection Time: 08/12/19  6:19 AM  Result Value Ref Range   Magnesium 2.1 1.7 - 2.4 mg/dL    Comment: Performed at [facility], [address], [place], [place] [address]  Glucose, capillary     Status: Abnormal   Collection Time: 08/12/19  7:55 AM  Result Value Ref Range   Glucose-Capillary 144 (H) 70 - 99 mg/dL    Comment: Glucose reference range applies only to samples taken after fasting for at least 8 hours.  Glucose, capillary     Status: Abnormal   Collection  Time: 08/12/19 11:57 AM  Result Value Ref Range   Glucose-Capillary 128 (H) 70 - 99 mg/dL    Comment: Glucose reference range applies only to samples taken after fasting for at least 8 hours.  Glucose, capillary     Status: Abnormal   Collection Time: 08/12/19  5:02 PM  Result Value Ref Range   Glucose-Capillary 110 (H) 70 - 99 mg/dL    Comment: Glucose reference range applies only to samples taken after fasting for at least 8 hours.   Comment 1 Notify RN    Comment 2 Document in Chart   Glucose, capillary     Status: Abnormal   Collection Time: 08/12/19  8:06 PM  Result Value Ref Range   Glucose-Capillary 127 (H) 70 - 99 mg/dL    Comment: Glucose reference range applies only to samples taken after fasting for at least 8 hours.   Comment 1 Notify RN    Comment 2 Document in Chart   Glucose, capillary     Status: Abnormal   Collection Time: 08/13/19 12:07 AM  Result Value Ref Range   Glucose-Capillary 118 (H) 70 - 99 mg/dL    Comment: Glucose reference range applies only to samples taken after fasting for at least 8 hours.  Glucose, capillary     Status: Abnormal   Collection Time: 08/13/19  3:19 AM  Result Value Ref Range   Glucose-Capillary 126 (H) 70 - 99 mg/dL    Comment: Glucose reference range applies only to samples taken after fasting for at least 8 hours.  CBC     Status: Abnormal   Collection Time: 08/13/19  7:11 AM  Result Value Ref Range   WBC 6.0 4.0 - 10.5 K/uL   RBC 4.34 4.22 - 5.81 MIL/uL   Hemoglobin 14.8 13.0 - 17.0 g/dL   HCT 23.5 36.1 - 44.3 %   MCV 101.2 (H) 80.0 - 100.0 fL   MCH 34.1 (H) 26.0 - 34.0 pg   MCHC 33.7 30.0 - 36.0 g/dL   RDW 15.4 00.8 - 67.6 %   Platelets 213 150 - 400 K/uL   nRBC 0.0 0.0 - 0.2 %    Comment: Performed at [facility], [address], [place], [place] [address]  Basic metabolic panel     Status: Abnormal   Collection Time: 08/13/19  7:11 AM  Result Value Ref Range   Sodium 137 135 - 145 mmol/L   Potassium 4.3 3.5 -  5.1 mmol/L   Chloride 101 98 - 111 mmol/L   CO2 29 22 - 32 mmol/L   Glucose, Bld 131 (H) 70 - 99 mg/dL    Comment: Glucose reference range applies only to samples taken after fasting for at least 8 hours.   BUN 16 8 - 23 mg/dL   Creatinine, Ser 2.45 0.61 - 1.24 mg/dL   Calcium 9.7 8.9 - 80.9 mg/dL   GFR calc non Af Amer >60 >60 mL/min   GFR calc Af Amer >60 >60 mL/min   Anion gap 7 5 - 15    Comment: Performed at [facility], [address], [place], [place] [address]  Magnesium     Status: None   Collection Time: 08/13/19  7:11 AM  Result Value Ref Range   Magnesium 2.2 1.7 - 2.4 mg/dL    Comment: Performed at [facility], [address], [place], [place] [address]  Glucose, capillary     Status: Abnormal   Collection Time: 08/13/19  7:32 AM  Result Value Ref Range   Glucose-Capillary 105 (H) 70 - 99 mg/dL    Comment: Glucose reference range applies only to samples taken after fasting for at least 8 hours.    Radiology/Results: DG CHEST PORT 1 VIEW  Result Date: 08/12/2019 CLINICAL DATA:  69 year old male with tachypnea. Status post level 2 trauma earlier this month. EXAM: PORTABLE CHEST 1 VIEW COMPARISON:  Portable chest and CT Chest, Abdomen, and Pelvis 08/06/2019. FINDINGS: Portable AP semi upright view at 0842 hours. Lower lung volumes. Enteric feeding tube courses to the abdomen, tip not included. Chronic left lateral rib fractures. Mediastinal contours remain normal. Allowing for portable technique the lungs are clear. No acute osseous abnormality identified. IMPRESSION: 1. Lower lung volumes.  No acute cardiopulmonary abnormality. 2. Enteric feeding tube in place. Electronically Signed   By: Odessa Fleming M.D.   On: 08/12/2019 10:48    Anti-infectives: Anti-infectives (From admission, onward)   None      Assessment/Plan: Problem List: Patient Active Problem List   Diagnosis Date Noted  . Malnutrition of moderate degree 08/10/2019  . Traumatic brain injury  (HCC) 08/06/2019    TBI with confusion.  Rehab pending.   * No surgery found *    LOS: 7 days   Matt B. Daphine Deutscher, MD, [facility] [phone] to reach the surgeon on call.    08/13/2019 9:45 AM

## 2019-08-14 LAB — GLUCOSE, CAPILLARY
Glucose-Capillary: 130 mg/dL — ABNORMAL HIGH (ref 70–99)
Glucose-Capillary: 138 mg/dL — ABNORMAL HIGH (ref 70–99)
Glucose-Capillary: 139 mg/dL — ABNORMAL HIGH (ref 70–99)
Glucose-Capillary: 144 mg/dL — ABNORMAL HIGH (ref 70–99)
Glucose-Capillary: 145 mg/dL — ABNORMAL HIGH (ref 70–99)
Glucose-Capillary: 148 mg/dL — ABNORMAL HIGH (ref 70–99)
Glucose-Capillary: 90 mg/dL (ref 70–99)

## 2019-08-14 NOTE — Progress Notes (Signed)
   Subjective/Chief Complaint:  Following commands and interactive, still confused. States he's taking some PO   Objective: Vital signs in last 24 hours: Temp:  [98.5 F (36.9 C)-98.8 F (37.1 C)] 98.6 F (37 C) (04/25 0749) Pulse Rate:  [72-86] 72 (04/25 0749) Resp:  [18-23] 23 (04/25 0749) BP: (122-156)/(74-86) 147/81 (04/25 0749) SpO2:  [65 %-100 %] 65 % (04/25 0749) Last BM Date: 08/12/19  Intake/Output from previous day: 04/24 0701 - 04/25 0700 In: 6579.3 [P.O.:600; I.V.:3631.7; NG/GT:2347.6] Out: 850 [Urine:850] Intake/Output this shift: No intake/output data recorded.  PE: Gen: Lethargic, NAD, restrained HEENT:right eye closed Card: RRR, no M/G/R heard, 2+ DP pulses Pulm: CTAB, no W/R/R, rate and effort normal Abd: Soft,protuberant, nontender, +BS Ext: no BUE/BLE edema, calves soft and nontender Psych:unable to answer orientation questions or follow commands this morning Neuro: moving all 4 extremities Skin: no rashes noted, warm and dry    Lab Results:  Recent Labs    08/12/19 0619 08/13/19 0711  WBC 5.6 6.0  HGB 14.2 14.8  HCT 41.0 43.9  PLT 201 213   BMET Recent Labs    08/12/19 0619 08/13/19 0711  NA 134* 137  K 3.8 4.3  CL 101 101  CO2 26 29  GLUCOSE 147* 131*  BUN 13 16  CREATININE 0.84 0.92  CALCIUM 9.4 9.7   Assessment/Plan: Fall down stairs  TBI/L SDH/SAH-f/uCT stable 4/18,NSGY c/s,Dr. Conchita Paris. Keppra x7d for sz ppx.TBI team therapies Temporal bone FX- NSGY c/s as above Hypokalemia-resolved Hyperglycemia - mild, A1c 5.1 Alcohol intoxication - SW for SBIRT, CIWA FEN-IVF,TF, Dys1 thin liquids diet/ working with SLP - advance per speech VTE- PAS,LMWH Foley - none Follow up - NS Dispo-ContinuePT/OT. CIR following.    LOS: 8 days    Axel Filler 08/14/2019

## 2019-08-15 LAB — BASIC METABOLIC PANEL
Anion gap: 12 (ref 5–15)
BUN: 18 mg/dL (ref 8–23)
CO2: 24 mmol/L (ref 22–32)
Calcium: 9.8 mg/dL (ref 8.9–10.3)
Chloride: 101 mmol/L (ref 98–111)
Creatinine, Ser: 0.84 mg/dL (ref 0.61–1.24)
GFR calc Af Amer: >60 mL/min (ref >60)
GFR calc non Af Amer: >60 mL/min (ref >60)
Glucose, Bld: 137 mg/dL — ABNORMAL HIGH (ref 70–99)
Potassium: 4.2 mmol/L (ref 3.5–5.1)
Sodium: 137 mmol/L (ref 135–145)

## 2019-08-15 LAB — GLUCOSE, CAPILLARY
Glucose-Capillary: 110 mg/dL — ABNORMAL HIGH (ref 70–99)
Glucose-Capillary: 119 mg/dL — ABNORMAL HIGH (ref 70–99)
Glucose-Capillary: 121 mg/dL — ABNORMAL HIGH (ref 70–99)
Glucose-Capillary: 125 mg/dL — ABNORMAL HIGH (ref 70–99)
Glucose-Capillary: 133 mg/dL — ABNORMAL HIGH (ref 70–99)
Glucose-Capillary: 145 mg/dL — ABNORMAL HIGH (ref 70–99)

## 2019-08-15 LAB — CBC
HCT: 43.9 % (ref 39.0–52.0)
Hemoglobin: 14.6 g/dL (ref 13.0–17.0)
MCH: 34 pg (ref 26.0–34.0)
MCHC: 33.3 g/dL (ref 30.0–36.0)
MCV: 102.1 fL — ABNORMAL HIGH (ref 80.0–100.0)
Platelets: 238 10*3/uL (ref 150–400)
RBC: 4.3 MIL/uL (ref 4.22–5.81)
RDW: 12.1 % (ref 11.5–15.5)
WBC: 5.3 10*3/uL (ref 4.0–10.5)
nRBC: 0 % (ref 0.0–0.2)

## 2019-08-15 LAB — MAGNESIUM: Magnesium: 2.2 mg/dL (ref 1.7–2.4)

## 2019-08-15 MED ORDER — CLONAZEPAM 0.25 MG PO TBDP
0.2500 mg | ORAL_TABLET | Freq: Two times a day (BID) | ORAL | Status: DC
  Administered 2019-08-15 – 2019-08-17 (×6): 0.25 mg via ORAL
  Filled 2019-08-15 (×6): qty 1

## 2019-08-15 NOTE — Progress Notes (Signed)
Physical Therapy Treatment Patient Details Name: Jeremiah West MRN: [id number] DOB: 11/17/1950 Today's Date: 08/15/2019    History of Present Illness 69 year old man who presents 08/06/19 after he fell down a flight of stairs (approximately 15 steps). Intoxicated with etoh level 221, history of alcohol abuse. Lt frontal and temporal SDH, Lt temporal SAH, right temporal bone fracture non-displaced PMHx-Emphysema, CHF with cardiomegaly    PT Comments    Pt more alert however not oriented, not following commands consistently, no carry over once re-oriented to place, time, and situation x6. Pt did participate in therapy and completed std pvt to chair with maxAx2. Restraints re-applied in chair. Cont to recommend CIR upon d/c. Acute PT to cont to follow.    Follow Up Recommendations  CIR;Supervision/Assistance - 24 hour     Equipment Recommendations  (TBD)    Recommendations for Other Services       Precautions / Restrictions Precautions Precautions: Fall Precaution Comments: in 4 point restraints and mits Restrictions Weight Bearing Restrictions: No    Mobility  Bed Mobility Overal bed mobility: Needs Assistance Bed Mobility: Supine to Sit     Supine to sit: Max assist;+2 for physical assistance     General bed mobility comments: pt with minimal initiation of LE movement towards EOB, pt with no command follow to use UEs to pull trunk up to EOB  Transfers Overall transfer level: Needs assistance Equipment used: 2 person hand held assist Transfers: Sit to/from [employer] Sit to Stand: Mod assist;Max assist;+2 physical assistance Stand pivot transfers: Mod assist;Max assist;+2 physical assistance       General transfer comment: with max encouragement and tacile cues pt able to achieve full upright standing, maxA to advance LEs to complete std pvt to chair, pt stating "Lord have mercy".   Ambulation/Gait             General Gait Details: pt unable  at this time   Stairs             Wheelchair Mobility    Modified Rankin (Stroke Patients Only) Modified Rankin (Stroke Patients Only) Pre-Morbid Rankin Score: No symptoms Modified Rankin: Moderately severe disability     Balance Overall balance assessment: Needs assistance Sitting-balance support: Single extremity supported;Feet supported Sitting balance-Leahy Scale: Fair Sitting balance - Comments: min/modA Postural control: Left lateral lean Standing balance support: Bilateral upper extremity supported Standing balance-Leahy Scale: Poor Standing balance comment: dependent on physical assist to maintain standing                            Cognition Arousal/Alertness: Awake/alert Behavior During Therapy: Flat affect Overall Cognitive Status: Impaired/Different from baseline Area of Impairment: Rancho level;Orientation;Attention;Memory;Following commands;Safety/judgement;Awareness;Problem solving               Rancho Levels of Cognitive Functioning Ranch[place] Amigos Scales of Cognitive Functioning: Confused/inappropriate/non-agitated Orientation Level: Disoriented to;Place;Time;Situation(pt re-oriented x 6 and no re-call) Current Attention Level: Focused Memory: Decreased short-term memory;Decreased recall of precautions Following Commands: Follows one step commands with increased time;Follows one step commands inconsistently Safety/Judgement: Decreased awareness of safety;Decreased awareness of deficits Awareness: Intellectual Problem Solving: Slow processing;Decreased initiation;Difficulty sequencing;Requires verbal cues;Requires tactile cues General Comments: pt pleasant but not oriented despite multiple attempts to re-orient, no carryover when told place, date, and situation. Pt with inconsistent command follow, significant delay in response, decreased insight to deficits and safety      Exercises      General Comments General  comments (skin  integrity, edema, etc.): SpO2 >92% on RA, VSS      Pertinent Vitals/Pain Pain Assessment: Faces Faces Pain Scale: No hurt    Home Living                      Prior Function            PT Goals (current goals can now be found in the care plan section) Progress towards PT goals: Progressing toward goals    Frequency    Min 3X/week      PT Plan Current plan remains appropriate    Co-evaluation              AM-PAC PT "6 Clicks" Mobility   Outcome Measure  Help needed turning from your back to your side while in a flat bed without using bedrails?: A Lot Help needed moving from lying on your back to sitting on the side of a flat bed without using bedrails?: A Lot Help needed moving to and from a bed to a chair (including a wheelchair)?: A Lot Help needed standing up from a chair using your arms (e.g., wheelchair or bedside chair)?: A Lot Help needed to walk in hospital room?: Total Help needed climbing 3-5 steps with a railing? : Total 6 Click Score: 10    End of Session   Activity Tolerance: Patient tolerated treatment well Patient left: in chair;with call bell/phone within reach;with chair alarm set;with restraints reapplied(posey and bilat wrist restraints) Nurse Communication: Mobility status PT Visit Diagnosis: History of falling (Z91.81);Other symptoms and signs involving the nervous system (J19.147)     Time: 8295-6213 PT Time Calculation (min) (ACUTE ONLY): 26 min  Charges:  $Therapeutic Activity: 8-22 mins                     Jeremiah West, PT, DPT Acute Rehabilitation Services Pager #: [phone] Office #: [phone]    Berline Lopes 08/15/2019, 9:15 AM

## 2019-08-15 NOTE — Progress Notes (Signed)
Inpatient Rehabilitation Admissions Coordinator  I have begun insurance authorization with [facility] [employer] for a possible CIR admit pending insurance approval. Cheri Rous will follow .  Ottie Glazier, RN, MSN Rehab Admissions Coordinator [phone] 08/15/2019 8:03 PM

## 2019-08-15 NOTE — Progress Notes (Signed)
  Speech Language Pathology Treatment: Dysphagia;Cognitive-Linquistic  Patient Details Name: Jeremiah West MRN: [id number] DOB: 04-30-1950 Today's Date: 08/15/2019 Time: 9798-9211 SLP Time Calculation (min) (ACUTE ONLY): 12 min  Assessment / Plan / Recommendation Clinical Impression  Pt remains disoriented but has improving sustained attention to bolus preparation. He consumed small pieces of soft solids with mastication time appearing functional considering his lack of dentition. There are no overt s/s of aspiration today and he has no oral residue. Recommend advancing diet to Dys 2 (chopped) solids, continuing thin liquids. Pt would benefit from full supervision for safety and adequate intake given mentation. Attempted basic divergent naming task with him today but he could not name any words in a given category despite cues. He did repeat them at the word level though.    HPI HPI: 69 year old man who presents 08/06/19 after he fell down a flight of stairs (approximately 15 steps). Intoxicated with etoh level 221, history of alcohol abuse. Lt frontal and temporal SDH, Lt temporal SAH, right temporal bone fracture non-displaced PMHx-Emphysema, CHF with cardiomegaly      SLP Plan  Continue with current plan of care       Recommendations  Diet recommendations: Dysphagia 2 (fine chop);Thin liquid Liquids provided via: Straw;Cup Medication Administration: Crushed with puree Supervision: Staff to assist with self feeding;Full supervision/cueing for compensatory strategies Compensations: Slow rate;Small sips/bites Postural Changes and/or Swallow Maneuvers: Seated upright 90 degrees;Upright 30-60 min after meal                Oral Care Recommendations: Oral care BID Follow up Recommendations: Inpatient Rehab SLP Visit Diagnosis: Cognitive communication deficit (R41.841);Dysphagia, unspecified (R13.10) Plan: Continue with current plan of care       GO                 Mahala Menghini., M.A. CCC-SLP Acute Rehabilitation Services Pager [phone] Office [phone]  08/15/2019, 4:15 PM

## 2019-08-15 NOTE — Progress Notes (Signed)
Occupational Therapy Treatment Patient Details Name: Jeremiah West MRN: [id number] DOB: May 02, 1950 Today's Date: 08/15/2019    History of present illness 69 year old man who presents 08/06/19 after he fell down a flight of stairs (approximately 15 steps). Intoxicated with etoh level 221, history of alcohol abuse. Lt frontal and temporal SDH, Lt temporal SAH, right temporal bone fracture non-displaced PMHx-Emphysema, CHF with cardiomegaly   OT comments  Pt presents supine in bed agreeable to therapy session. Pt continues to present with impaired cognition, but with calm/flat affect during this session,often requiring multimodal cues for following simple commands and pt with no carryover/recall of orientation despite education frequently provided during session. Pt tolerating OOB to recliner with two person assist. He engaged in simple grooming ADL while seated in recliner but with no attempts to initiate LB task when cued (donning socks). Feel POC remains appropriate at this time. Will continue to follow acutely.   Follow Up Recommendations  CIR;Supervision/Assistance - 24 hour    Equipment Recommendations  Other (comment)(TBD)          Precautions / Restrictions Precautions Precautions: Fall Precaution Comments: in 4 point restraints and mits Restrictions Weight Bearing Restrictions: No       Mobility Bed Mobility Overal bed mobility: Needs Assistance Bed Mobility: Supine to Sit     Supine to sit: Max assist;+2 for physical assistance     General bed mobility comments: pt with minimal initiation of LE movement towards EOB, pt with no command follow to use UEs to pull trunk up to EOB  Transfers Overall transfer level: Needs assistance Equipment used: 2 person hand held assist Transfers: Sit to/from [facility] Sit to Stand: Mod assist;Max assist;+2 physical assistance Stand pivot transfers: Mod assist;Max assist;+2 physical assistance       General  transfer comment: with max encouragement and tacile cues pt able to achieve full upright standing, maxA to advance LEs to complete std pvt to chair, pt stating "Lord have mercy".     Balance Overall balance assessment: Needs assistance Sitting-balance support: Single extremity supported;Feet supported Sitting balance-Leahy Scale: Fair Sitting balance - Comments: min/modA Postural control: Left lateral lean Standing balance support: Bilateral upper extremity supported Standing balance-Leahy Scale: Poor Standing balance comment: dependent on physical assist to maintain standing                           ADL either performed or assessed with clinical judgement   ADL Overall ADL's : Needs assistance/impaired     Grooming: Wash/dry face;Minimal assistance;Sitting               Lower Body Dressing: Maximal assistance;+2 for physical assistance;Sit to/from stand Lower Body Dressing Details (indicate cue type and reason): pt with no attempts to initiate donning socks when instructed to do so, requries assist seated EOB             Functional mobility during ADLs: Maximal assistance;+2 for physical assistance(stand pivot) General ADL Comments: pt remains with impaired cognition, poor standing balance and endurance                       Cognition Arousal/Alertness: Awake/alert Behavior During Therapy: Flat affect Overall Cognitive Status: Impaired/Different from baseline Area of Impairment: Rancho level;Orientation;Attention;Memory;Following commands;Safety/judgement;Awareness;Problem solving               Rancho Levels of Cognitive Functioning Rancho Los Amigos Scales of Cognitive Functioning: Confused/inappropriate/non-agitated Orientation Level: Disoriented to;Place;Time;Situation(pt re-oriented x  6 and no re-call) Current Attention Level: Focused Memory: Decreased short-term memory;Decreased recall of precautions Following Commands: Follows one step  commands with increased time;Follows one step commands inconsistently Safety/Judgement: Decreased awareness of safety;Decreased awareness of deficits Awareness: Intellectual Problem Solving: Slow processing;Decreased initiation;Difficulty sequencing;Requires verbal cues;Requires tactile cues General Comments: pt pleasant but not oriented despite multiple attempts to re-orient, no carryover when told place, date, and situation. Pt with inconsistent command follow, significant delay in response, decreased insight to deficits and safety        Exercises     Shoulder Instructions       General Comments SpO2 >92% on RA, VSS    Pertinent Vitals/ Pain       Pain Assessment: Faces Faces Pain Scale: No hurt Pain Intervention(s): Monitored during session  Home Living                                          Prior Functioning/Environment              Frequency  Min 2X/week        Progress Toward Goals  OT Goals(current goals can now be found in the care plan section)  Progress towards OT goals: Progressing toward goals  Acute Rehab OT Goals Patient Stated Goal: have mitts off OT Goal Formulation: With patient Time For Goal Achievement: 08/22/19 Potential to Achieve Goals: Good ADL Goals Pt Will Perform Grooming: with min assist;sitting Pt Will Transfer to Toilet: with min assist;bedside commode;stand pivot transfer Pt Will Perform Toileting - Clothing Manipulation and hygiene: with min assist;sitting/lateral leans;sit to/from stand Additional ADL Goal #1: Pt will perform bed mobility with Min A in prepartion for ADLs Additional ADL Goal #2: Pt will follow one step commands with Min cues during ADLs  Plan Discharge plan remains appropriate    Co-evaluation    PT/OT/SLP Co-Evaluation/Treatment: Yes Reason for Co-Treatment: Complexity of the patient's impairments (multi-system involvement);For patient/therapist safety   OT goals addressed during  session: ADL's and self-care      AM-PAC OT "6 Clicks" Daily Activity     Outcome Measure   Help from another person eating meals?: Total Help from another person taking care of personal grooming?: A Lot Help from another person toileting, which includes using toliet, bedpan, or urinal?: Total Help from another person bathing (including washing, rinsing, drying)?: A Lot Help from another person to put on and taking off regular upper body clothing?: Total Help from another person to put on and taking off regular lower body clothing?: Total 6 Click Score: 8    End of Session Equipment Utilized During Treatment: Gait belt  OT Visit Diagnosis: Unsteadiness on feet (R26.81);Other abnormalities of gait and mobility (R26.89);Muscle weakness (generalized) (M62.81)   Activity Tolerance Patient tolerated treatment well   Patient Left in chair;with call bell/phone within reach;with chair alarm set;with restraints reapplied   Nurse Communication Mobility status        Time: 4193-7902 OT Time Calculation (min): 26 min  Charges: OT General Charges $OT Visit: 1 Visit OT Treatments $Self Care/Home Management : 8-22 mins  Marcy Siren, OT Acute Rehabilitation Services Pager [phone] Office [phone]    Orlando Penner 08/15/2019, 12:52 PM

## 2019-08-15 NOTE — Progress Notes (Signed)
[facility] Progress Note     Subjective: CC-  Patient lethargic and difficult to arouse this morning. Can tell me his name but answers "I don't know" to all other questions. He does follow commands. He has not received any medications this morning. Per notes he has been more talkative and interactive. Did not work with therapies over the weekend, should see PT/OT/SLP today.  Objective: Vital signs in last 24 hours: Temp:  [97.8 F (36.6 C)-99 F (37.2 C)] 97.8 F (36.6 C) (04/26 0740) Pulse Rate:  [71-111] 74 (04/26 0740) Resp:  [16-23] 18 (04/26 0740) BP: (128-150)/(80-116) 136/80 (04/26 0740) SpO2:  [88 %-100 %] 97 % (04/26 0740) Weight:  [71 kg] 71 kg (04/26 0500) Last BM Date: 08/12/19  Intake/Output from previous day: 04/25 0701 - 04/26 0700 In: -  Out: 300 [Urine:300] Intake/Output this shift: No intake/output data recorded.  PE: Gen: Lethargic, NAD, restrained HEENT:right eye closed Card: RRR, no M/G/R heard, 2+ DP pulses Pulm: CTAB, no W/R/R, rate and effort normal on 2L >> decreased to 1L and O2 sats remained stable Abd: Soft,protuberant, nontender, +BS Ext: no BUE/BLE edema, calves soft and nontender Psych:Oriented to self only Neuro: moving all 4 extremities and follows commands Skin: no rashes noted, warm and dry    Lab Results:  Recent Labs    08/13/19 0711  WBC 6.0  HGB 14.8  HCT 43.9  PLT 213   BMET Recent Labs    08/13/19 0711  NA 137  K 4.3  CL 101  CO2 29  GLUCOSE 131*  BUN 16  CREATININE 0.92  CALCIUM 9.7   PT/INR No results for input(s): LABPROT, INR in the last 72 hours. CMP     Component Value Date/Time   NA 137 08/13/2019 0711   K 4.3 08/13/2019 0711   CL 101 08/13/2019 0711   CO2 29 08/13/2019 0711   GLUCOSE 131 (H) 08/13/2019 0711   BUN 16 08/13/2019 0711   CREATININE 0.92 08/13/2019 0711   CALCIUM 9.7 08/13/2019 0711   PROT 8.4 (H) 08/06/2019 2123   ALBUMIN 4.3 08/06/2019 2123   AST 34  08/06/2019 2123   ALT 32 08/06/2019 2123   ALKPHOS 80 08/06/2019 2123   BILITOT 0.4 08/06/2019 2123   GFRNONAA >60 08/13/2019 0711   GFRAA >60 08/13/2019 0711   Lipase  No results found for: LIPASE     Studies/Results: No results found.  Anti-infectives: Anti-infectives (From admission, onward)   None       Assessment/Plan Fall down stairs  TBI/L SDH/SAH-f/uCT stable 4/18,NSGY c/s,Dr. Conchita Paris. Completed Keppra x7d for sz ppx.TBI team therapies Temporal bone FX- NSGY c/s as above Hypokalemia- check BMP Hyperglycemia - mild, A1c 5.1 Alcohol intoxication - SW for SBIRT, CIWA FEN-IVF,TF, Dys1 thin liquids diet/ working with SLP- advance per speech VTE- PAS,LMWH Foley - none Follow up - NS Dispo-Check labs. Decrease klonopin 0.25 BID. PT/OT/SLP today. CIR following.   LOS: 9 days    Franne Forts, [facility] 08/15/2019, 8:21 AM Please see Amion for pager number during day hours 7:00am-4:30pm

## 2019-08-16 LAB — GLUCOSE, CAPILLARY
Glucose-Capillary: 105 mg/dL — ABNORMAL HIGH (ref 70–99)
Glucose-Capillary: 124 mg/dL — ABNORMAL HIGH (ref 70–99)
Glucose-Capillary: 120 mg/dL — ABNORMAL HIGH (ref 70–99)
Glucose-Capillary: 135 mg/dL — ABNORMAL HIGH (ref 70–99)
Glucose-Capillary: 117 mg/dL — ABNORMAL HIGH (ref 70–99)
Glucose-Capillary: 119 mg/dL — ABNORMAL HIGH (ref 70–99)

## 2019-08-16 NOTE — Progress Notes (Signed)
[facility] Progress Note     Subjective: CC-  More alert and talkative this morning. No complaints. Has not required ativan in several days  Objective: Vital signs in last 24 hours: Temp:  [97.4 F (36.3 C)-98.6 F (37 C)] 98 F (36.7 C) (04/27 0700) Pulse Rate:  [64-99] 64 (04/27 0700) Resp:  [16-35] 16 (04/27 0700) BP: (139-161)/(59-89) 139/59 (04/27 0700) SpO2:  [95 %-100 %] 99 % (04/27 0700) Last BM Date: 08/15/19  Intake/Output from previous day: 04/26 0701 - 04/27 0700 In: 2733.2 [P.O.:338; I.V.:2395.2] Out: 400 [Urine:400] Intake/Output this shift: No intake/output data recorded.  PE: Gen: Alert, NAD, restrained HEENT:right eye closed Card: RRR, no M/G/R heard, 2+ DP pulses Pulm: CTAB, no W/R/R, rate and effort normal on room air Abd: Soft,protuberant, nontender, +BS Ext: no BUE/BLE edema, calves soft and nontender Psych:Oriented to self only Neuro: moving all 4 extremities and follows some simple commands Skin: no rashes noted, warm and dry   Lab Results:  Recent Labs    08/15/19 0903  WBC 5.3  HGB 14.6  HCT 43.9  PLT 238   BMET Recent Labs    08/15/19 0903  NA 137  K 4.2  CL 101  CO2 24  GLUCOSE 137*  BUN 18  CREATININE 0.84  CALCIUM 9.8   PT/INR No results for input(s): LABPROT, INR in the last 72 hours. CMP     Component Value Date/Time   NA 137 08/15/2019 0903   K 4.2 08/15/2019 0903   CL 101 08/15/2019 0903   CO2 24 08/15/2019 0903   GLUCOSE 137 (H) 08/15/2019 0903   BUN 18 08/15/2019 0903   CREATININE 0.84 08/15/2019 0903   CALCIUM 9.8 08/15/2019 0903   PROT 8.4 (H) 08/06/2019 2123   ALBUMIN 4.3 08/06/2019 2123   AST 34 08/06/2019 2123   ALT 32 08/06/2019 2123   ALKPHOS 80 08/06/2019 2123   BILITOT 0.4 08/06/2019 2123   GFRNONAA >60 08/15/2019 0903   GFRAA >60 08/15/2019 0903   Lipase  No results found for: LIPASE     Studies/Results: No results found.  Anti-infectives: Anti-infectives  (From admission, onward)   None       Assessment/Plan Fall down stairs  TBI/L SDH/SAH-f/uCT stable 4/18,NSGY c/s,Dr. Conchita Paris. Completed Keppra x7d for sz ppx.TBI team therapies, neuro exam improved Temporal bone FX- NSGY c/s as above Hypokalemia- resolved Hyperglycemia - mild, A1c 5.1 Alcohol intoxication - SW for SBIRT, CIWA FEN-IVF,TF, Dys2 thin liquids diet/ working with SLP- advance per speech. Continue klonopin 0.25mg  BID VTE- PAS,LMWH Foley - none Follow up - NS Dispo-Continue therapies. Stable for discharge to CIR when bed available.   LOS: 10 days    Franne Forts, [facility] 08/16/2019, 8:24 AM Please see Amion for pager number during day hours 7:00am-4:30pm

## 2019-08-17 LAB — GLUCOSE, CAPILLARY
Glucose-Capillary: 114 mg/dL — ABNORMAL HIGH (ref 70–99)
Glucose-Capillary: 114 mg/dL — ABNORMAL HIGH (ref 70–99)
Glucose-Capillary: 123 mg/dL — ABNORMAL HIGH (ref 70–99)
Glucose-Capillary: 124 mg/dL — ABNORMAL HIGH (ref 70–99)
Glucose-Capillary: 140 mg/dL — ABNORMAL HIGH (ref 70–99)
Glucose-Capillary: 164 mg/dL — ABNORMAL HIGH (ref 70–99)

## 2019-08-17 MED ORDER — PIVOT 1.5 CAL PO LIQD
1000.0000 mL | ORAL | Status: DC
  Filled 2019-08-17 (×2): qty 1000

## 2019-08-17 MED ORDER — ACETAMINOPHEN 325 MG PO TABS
650.0000 mg | ORAL_TABLET | ORAL | Status: DC | PRN
  Administered 2019-08-17: 650 mg via ORAL
  Filled 2019-08-17: qty 2

## 2019-08-17 MED ORDER — THIAMINE HCL 100 MG PO TABS
100.0000 mg | ORAL_TABLET | Freq: Every day | ORAL | Status: DC
  Administered 2019-08-18: 100 mg via ORAL
  Filled 2019-08-17: qty 1

## 2019-08-17 MED ORDER — FOLIC ACID 1 MG PO TABS
1.0000 mg | ORAL_TABLET | Freq: Every day | ORAL | Status: DC
  Administered 2019-08-18: 1 mg via ORAL
  Filled 2019-08-17: qty 1

## 2019-08-17 MED ORDER — ENSURE ENLIVE PO LIQD
237.0000 mL | Freq: Two times a day (BID) | ORAL | Status: DC
  Administered 2019-08-17 – 2019-08-18 (×4): 237 mL via ORAL

## 2019-08-17 MED ORDER — OXYCODONE HCL 5 MG PO TABS: 5.0000 mg | ORAL_TABLET | ORAL | Status: DC | PRN

## 2019-08-17 MED ORDER — POLYETHYLENE GLYCOL 3350 17 G PO PACK: 17.0000 g | PACK | Freq: Every day | ORAL | Status: DC

## 2019-08-17 MED ORDER — ADULT MULTIVITAMIN W/MINERALS CH
1.0000 | ORAL_TABLET | Freq: Every day | ORAL | Status: DC
  Administered 2019-08-18: 1 via ORAL
  Filled 2019-08-17: qty 1

## 2019-08-17 MED ORDER — DOCUSATE SODIUM 100 MG PO CAPS
100.0000 mg | ORAL_CAPSULE | Freq: Two times a day (BID) | ORAL | Status: DC
  Administered 2019-08-17: 100 mg via ORAL
  Filled 2019-08-17: qty 1

## 2019-08-17 NOTE — Progress Notes (Signed)
Physical Therapy Treatment Patient Details Name: Jeremiah West MRN: [id number] DOB: July 02, 1950 Today's Date: 08/17/2019    History of Present Illness 69 year old man who presents 08/06/19 after he fell down a flight of stairs (approximately 15 steps). Intoxicated with etoh level 221, history of alcohol abuse. Lt frontal and temporal SDH, Lt temporal SAH, right temporal bone fracture non-displaced PMHx-Emphysema, CHF with cardiomegaly    PT Comments    Pt tolerated treatment well, improving transfer quality and gait tolerance. Pt continues to require physical assistance for all OOb mobility due to balance and gait deviations which place him at a high falls risk. Pt has no awareness of deficits at this time and impulsively tries to mobilize during session exacerbating his falls risk. Pt will benefit from continued acute PT POC to reduce falls risk and caregiver burden.    Follow Up Recommendations  CIR;Supervision/Assistance - 24 hour     Equipment Recommendations  Wheelchair (measurements PT);Wheelchair cushion (measurements PT)    Recommendations for Other Services       Precautions / Restrictions Precautions Precautions: Fall Restrictions Weight Bearing Restrictions: No    Mobility  Bed Mobility Overal bed mobility: Needs Assistance Bed Mobility: Supine to Sit     Supine to sit: Min guard        Transfers Overall transfer level: Needs assistance Equipment used: Rolling walker (2 wheeled) Transfers: Sit to/from Stand Sit to Stand: Min assist Stand pivot transfers: Mod assist          Ambulation/Gait Ambulation/Gait assistance: Mod assist Gait Distance (Feet): 15 Feet Assistive device: Rolling walker (2 wheeled) Gait Pattern/deviations: Step-to pattern;Drifts right/left;Trunk flexed Gait velocity: reduced Gait velocity interpretation: <1.31 ft/sec, indicative of household ambulator General Gait Details: pt with step to gait, anterior trunk lean needing  physical assistance to correct. PT provides cues to maintain RW closer to BOS however pt is unable to maintain this during session   Stairs             Wheelchair Mobility    Modified Rankin (Stroke Patients Only) Modified Rankin (Stroke Patients Only) Pre-Morbid Rankin Score: No symptoms Modified Rankin: Moderately severe disability     Balance Overall balance assessment: Needs assistance Sitting-balance support: No upper extremity supported;Feet supported Sitting balance-Leahy Scale: Fair Sitting balance - Comments: minG at edge of bed   Standing balance support: Bilateral upper extremity supported Standing balance-Leahy Scale: Poor Standing balance comment: minA 2/2 anterior trunk lean in standing                            Cognition Arousal/Alertness: Awake/alert Behavior During Therapy: WFL for tasks assessed/performed Overall Cognitive Status: Impaired/Different from baseline Area of Impairment: Rancho level;Orientation;Attention;Memory;Following commands;Safety/judgement;Awareness;Problem solving               Rancho Levels of Cognitive Functioning Rancho Los Amigos Scales of Cognitive Functioning: Confused/inappropriate/non-agitated   Current Attention Level: Focused Memory: Decreased short-term memory;Decreased recall of precautions Following Commands: Follows one step commands with increased time Safety/Judgement: Decreased awareness of safety;Decreased awareness of deficits Awareness: Intellectual Problem Solving: Slow processing;Requires verbal cues;Requires tactile cues;Difficulty sequencing General Comments: pleasant, confused, sister present and pt thinking he has multiple sisters although he only has one      Exercises      General Comments General comments (skin integrity, edema, etc.): VSS on RA, impulsive      Pertinent Vitals/Pain Pain Assessment: No/denies pain    Home Living  Prior  Function            PT Goals (current goals can now be found in the care plan section) Progress towards PT goals: Progressing toward goals    Frequency    Min 3X/week      PT Plan Current plan remains appropriate    Co-evaluation              AM-PAC PT "6 Clicks" Mobility   Outcome Measure  Help needed turning from your back to your side while in a flat bed without using bedrails?: A Little Help needed moving from lying on your back to sitting on the side of a flat bed without using bedrails?: A Little Help needed moving to and from a bed to a chair (including a wheelchair)?: A Lot Help needed standing up from a chair using your arms (e.g., wheelchair or bedside chair)?: A Lot Help needed to walk in hospital room?: A Lot Help needed climbing 3-5 steps with a railing? : Total 6 Click Score: 13    End of Session   Activity Tolerance: Patient tolerated treatment well Patient left: in chair;with call bell/phone within reach;with chair alarm set Nurse Communication: Mobility status PT Visit Diagnosis: History of falling (Z91.81);Other symptoms and signs involving the nervous system (R29.898)     Time: 4098-1191 PT Time Calculation (min) (ACUTE ONLY): 28 min  Charges:  $Gait Training: 8-22 mins $Therapeutic Activity: 8-22 mins                     Arlyss Gandy, PT, DPT Acute Rehabilitation Pager: [phone]    Arlyss Gandy 08/17/2019, 5:08 PM

## 2019-08-17 NOTE — Progress Notes (Signed)
Nutrition Follow-up  DOCUMENTATION CODES:   Non-severe (moderate) malnutrition in context of chronic illness  INTERVENTION:  Provide Ensure Enlive po BID, each supplement provides 350 kcal and 20 grams of protein.  Continue Pivot 1.5 formula via Cortrak NGT at rate of 25 ml/hr to provide 900 kcal (47% of kcal needs) and 56 grams of protein (51% of protein needs).   Encourage adequate PO intake.   NUTRITION DIAGNOSIS:   Moderate Malnutrition related to chronic illness(chronic medical issues and/or ETOH abuse) as evidenced by mild fat depletion, moderate muscle depletion; ongoing  GOAL:   Patient will meet greater than or equal to 90% of their needs; progressing  MONITOR:   PO intake, Supplement acceptance, Diet advancement, Skin, TF tolerance, Weight trends, Labs, I & O's  REASON FOR ASSESSMENT:   Consult Enteral/tube feeding initiation and management  ASSESSMENT:   Pt intoxicated and admitted s/p fall down 15 steps with TBI/L SDH/SAH, and temporal bone fx. Pt with known ETOH abuse with findings of aortic atherosclerosis, emphysema, and CHF/volume overload with cardiomegaly found per MD on exam. Cortrak NGT placed 4/19. Tip of tube in stomach.   Pt is currently on a dysphagia 2 diet with thin liquids. Meal completion has been 50-100%. Pt currently has Ensure ordered and has been consuming them. Tube feeding has been decreased to new rate of 25 ml/hr per MD orders to provide ~50% of nutrition needs to encouraged PO intake. RD to continue with current orders.   Labs and medications reviewed.   Diet Order:   Diet Order            DIET DYS 2 Room service appropriate? Yes; Fluid consistency: Thin  Diet effective now              EDUCATION NEEDS:   No education needs have been identified at this time  Skin:  Skin Assessment: Reviewed RN Assessment  Last BM:  4/27  Height:   Ht Readings from Last 1 Encounters:  08/06/19 5\' 8"  (1.727 m)    Weight:   Wt Readings  from Last 1 Encounters:  08/17/19 70.8 kg    Ideal Body Weight:  70 kg  BMI:  Body mass index is 23.73 kg/m.  Estimated Nutritional Needs:   Kcal:  1900-2100  Protein:  110-125 grams  Fluid:  2 L/day   08/19/19, MS, RD, LDN RD pager number/after hours weekend pager number on Amion.

## 2019-08-17 NOTE — Progress Notes (Signed)
Patient remains confused alert to self only attempt to reorient but unsuccessful. Patient kicking legs over side rails trying to get out of bed and has pulled one IV out this shift.Patient currently in waist belt restraint and green mitts applied prn dose of haldol given.

## 2019-08-17 NOTE — Progress Notes (Addendum)
[facility] Progress Note     Subjective: CC-  Comfortable this morning. Alert and oriented to self.  Per report patient ate 100% of lunch and dinner yesterday. BM yesterday.  Objective: Vital signs in last 24 hours: Temp:  [97.9 F (36.6 C)-98.9 F (37.2 C)] 97.9 F (36.6 C) (04/28 0319) Pulse Rate:  [89-103] 94 (04/28 0319) Resp:  [17-21] 20 (04/28 0319) BP: (142-157)/(64-87) 143/76 (04/28 0319) SpO2:  [96 %-100 %] 96 % (04/28 0319) Weight:  [70.8 kg] 70.8 kg (04/28 0600) Last BM Date: 08/16/19  Intake/Output from previous day: 04/27 0701 - 04/28 0700 In: 4104.7 [P.O.:820; I.V.:124.6; NG/GT:3160.1] Out: 350 [Urine:350] Intake/Output this shift: No intake/output data recorded.  PE: Gen: Alert, NAD, restrained HEENT:right eye closed Card: RRR, no M/G/R heard, 2+ DP pulses Pulm: CTAB, no W/R/R, rate and effort normalon room air Abd: Soft,protuberant, nontender, +BS Ext: no BUE/BLE edema, calves soft and nontender Psych:Oriented to self only Neuro: moving all 4 extremitiesand follows some simple commands Skin: no rashes noted, warm and dry  Lab Results:  Recent Labs    08/15/19 0903  WBC 5.3  HGB 14.6  HCT 43.9  PLT 238   BMET Recent Labs    08/15/19 0903  NA 137  K 4.2  CL 101  CO2 24  GLUCOSE 137*  BUN 18  CREATININE 0.84  CALCIUM 9.8   PT/INR No results for input(s): LABPROT, INR in the last 72 hours. CMP     Component Value Date/Time   NA 137 08/15/2019 0903   K 4.2 08/15/2019 0903   CL 101 08/15/2019 0903   CO2 24 08/15/2019 0903   GLUCOSE 137 (H) 08/15/2019 0903   BUN 18 08/15/2019 0903   CREATININE 0.84 08/15/2019 0903   CALCIUM 9.8 08/15/2019 0903   PROT 8.4 (H) 08/06/2019 2123   ALBUMIN 4.3 08/06/2019 2123   AST 34 08/06/2019 2123   ALT 32 08/06/2019 2123   ALKPHOS 80 08/06/2019 2123   BILITOT 0.4 08/06/2019 2123   GFRNONAA >60 08/15/2019 0903   GFRAA >60 08/15/2019 0903   Lipase  No results found for:  LIPASE     Studies/Results: No results found.  Anti-infectives: Anti-infectives (From admission, onward)   None       Assessment/Plan Fall down stairs  TBI/L SDH/SAH-f/uCT stable 4/18,NSGY c/s,Dr. Conchita Paris.CompletedKeppra x7d for sz ppx.TBI team therapies, neuro exam improved Temporal bone FX- NSGY c/s as above Hypokalemia-resolved Hyperglycemia - mild, A1c 5.1 Alcohol intoxication - SW for SBIRT, CIWA FEN- KVOIVF, Ensure,TF, Dys2 thin liquids diet/ working with SLP- advance per speech. Continue klonopin 0.25mg  BID VTE- PAS,LMWH Foley - none Follow up - NS Dispo-Transition to bolus tube feedings, add Ensure BID, and encourage PO intake. Medications changes to PO rather than per tube. Continue therapies. Stable for discharge to CIR when bed available.    LOS: 11 days    Franne Forts, [facility] 08/17/2019, 8:44 AM Please see Amion for pager number during day hours 7:00am-4:30pm

## 2019-08-17 NOTE — Progress Notes (Signed)
Inpatient Rehabilitation-Admissions Coordinator   Still waiting to hear from insurance regarding the patient's request for CIR. Will update once I have received a determination.   Cheri Rous, OTR/L  Rehab Admissions Coordinator  [phone] 08/17/2019 1:45 PM

## 2019-08-17 NOTE — Discharge Instructions (Signed)
Concussion, Adult  A concussion is a brain injury from a hard, direct hit (trauma) to your head or body. This direct hit causes the brain to quickly shake back and forth inside the skull. A concussion may also be called a mild traumatic brain injury (TBI). Healing from this injury can take time. What are the causes? This condition is caused by:  A direct hit to your head, such as: ? Running into a player during a game. ? Being hit in a fight. ? Hitting your head on a hard surface.  A quick and sudden movement (jolt) of the head or neck, such as in a car crash. What are the signs or symptoms? The signs of a concussion can be hard to notice. They may be missed by you, family members, and doctors. You may look fine on the outside but may not act or feel normal. Physical symptoms  Headaches.  Being tired (fatigued).  Being dizzy.  Problems with body balance.  Problems seeing or hearing.  Being sensitive to light or noise.  Feeling sick to your stomach (nausea) or throwing up (vomiting).  Not sleeping or eating as you used to.  Loss of feeling (numbness) or tingling in the body.  Seizure. Mental and emotional symptoms  Problems remembering things.  Trouble focusing your mind (concentrating), organizing, or making decisions.  Being slow to think, act, react, speak, or read.  Feeling grouchy (irritable).  Having mood changes.  Feeling worried or nervous (anxious).  Feeling sad (depressed). How is this treated? This condition may be treated by:  Stopping sports or activity if you are injured. If you hit your head or have signs of concussion: ? Do not return to sports or activities the same day. ? Get checked by a doctor before you return to your activities.  Resting your body and your mind.  Being watched carefully, often at home.  Medicines to help with symptoms such as: ? Feeling sick to your stomach. ? Headaches. ? Problems with sleep.  Avoid taking strong  pain medicines (opioids) for a concussion.  Avoiding alcohol and drugs.  Being asked to go to a concussion clinic or a place to help you recover (rehabilitation center). Recovery from a concussion can take time. Return to activities only:  When you are fully healed.  When your doctor says it is safe. Follow these instructions at home: Activity  Limit activities that need a lot of thought or focus, such as: ? Homework or work for your job. ? Watching TV. ? Using the computer or phone. ? Playing memory games and puzzles.  Rest. Rest helps your brain heal. Make sure you: ? Get plenty of sleep. Most adults should get 7-9 hours of sleep each night. ? Rest during the day. Take naps or breaks when you feel tired.  Avoid activity like exercise until your doctor says its safe. Stop any activity that makes symptoms worse.  Do not do activities that could cause a second concussion, such as riding a bike or playing sports.  Ask your doctor when you can return to your normal activities, such as school, work, sports, and driving. Your ability to react may be slower. Do not do these activities if you are dizzy. General instructions   Take over-the-counter and prescription medicines only as told by your doctor.  Do not drink alcohol until your doctor says you can.  Watch your symptoms and tell other people to do the same. Other problems can occur after a concussion. Older   adults have a higher risk of serious problems.  Tell your work manager, teachers, school nurse, school counselor, coach, or athletic trainer about your injury and symptoms. Tell them about what you can or cannot do.  Keep all follow-up visits as told by your doctor. This is important. How is this prevented?  It is very important that you do not get another brain injury. In rare cases, another injury can cause brain damage that will not go away, brain swelling, or death. The risk of this is greatest in the first 7-10 days  after a head injury. To avoid injuries: ? Stop activities that could lead to a second concussion, such as contact sports, until your doctor says it is okay. ? When you return to sports or activities:  Do not crash into other players. This is how most concussions happen.  Follow the rules.  Respect other players. ? Get regular exercise. Do strength and balance training. ? Wear a helmet that fits you well during sports, biking, or other activities.  Helmets can help protect you from serious skull and brain injuries, but they do not protect you from a concussion. Even when wearing a helmet, you should avoid being hit in the head. Contact a doctor if:  Your symptoms get worse or they do not get better.  You have new symptoms.  You have another injury. Get help right away if:  You have bad headaches or your headaches get worse.  You feel weak or numb in any part of your body.  You are mixed up (confused).  Your balance gets worse.  You keep throwing up.  You feel more sleepy than normal.  Your speech is not clear (is slurred).  You cannot recognize people or places.  You have a seizure.  Others have trouble waking you up.  You have behavior changes.  You have changes in how you see (vision).  You pass out (lose consciousness). Summary  A concussion is a brain injury from a hard, direct hit (trauma) to your head or body.  This condition is treated with rest and careful watching of symptoms.  If you keep having symptoms, call your doctor. This information is not intended to replace advice given to you by your health care provider. Make sure you discuss any questions you have with your health care provider. Document Revised: 11/26/2017 Document Reviewed: 11/26/2017 Elsevier Patient Education  2020 Elsevier Inc.  

## 2019-08-17 NOTE — TOC Progression Note (Signed)
Transition of Care [facility]) - Progression Note    Patient Details  Name: Jeremiah West MRN: [id number] Date of Birth: 1951-01-10  Transition of Care [facility]) CM/SW Contact  Astrid Drafts Berna Spare, RN Phone Number: 08/17/2019, 4:30 PM  Clinical Narrative:  [facility] requesting peer to peer for insurance auth/inpt rehab admission.  Notified Dr. Bedelia Person: Peer to Peer Info: Phone:  [phone], opt 5 Must be done by 08/18/19 at 11:30am.       Expected Discharge Plan: IP Rehab Facility Barriers to Discharge: Continued Medical Work up  Expected Discharge Plan and Services Expected Discharge Plan: IP Rehab Facility       Living arrangements for the past 2 months: Single Family Home                                       Social Determinants of Health (SDOH) Interventions    Readmission Risk Interventions No flowsheet data found.  Quintella Baton, RN, BSN  Trauma/Neuro ICU Case Manager [phone]

## 2019-08-17 NOTE — Progress Notes (Signed)
  Speech Language Pathology Treatment: Dysphagia  Patient Details Name: Jeremiah West MRN: [id number] DOB: [birth year] Today's Date: 08/17/2019 Time: 4401-0272 SLP Time Calculation (min) (ACUTE ONLY): 11 min  Assessment / Plan / Recommendation Clinical Impression  Pt is lethargic this morning - per discussion with RN, this could likely be medication related. She says that he has been doing very well with his advanced diet of chopped foods, including improved intake. This morning he aroused briefly after completion of oral care to drink almost half a cup of water with no overt difficulties, but he declined all solids offered and quickly fell back asleep despite stimulation provided. Anticipate that he will continue to do well on current diet as medication effects wear off, so will continue that for now but will f/u when more alert to see if he can even advance.    HPI HPI: 69 year old man who presents 08/06/19 after he fell down a flight of stairs (approximately 15 steps). Intoxicated with etoh level 221, history of alcohol abuse. Lt frontal and temporal SDH, Lt temporal SAH, right temporal bone fracture non-displaced PMHx-Emphysema, CHF with cardiomegaly      SLP Plan  Continue with current plan of care       Recommendations  Diet recommendations: Dysphagia 2 (fine chop);Thin liquid Liquids provided via: Straw;Cup Medication Administration: Crushed with puree Supervision: Staff to assist with self feeding;Full supervision/cueing for compensatory strategies Compensations: Slow rate;Small sips/bites Postural Changes and/or Swallow Maneuvers: Seated upright 90 degrees;Upright 30-60 min after meal                Oral Care Recommendations: Oral care BID Follow up Recommendations: Inpatient Rehab SLP Visit Diagnosis: Dysphagia, unspecified (R13.10) Plan: Continue with current plan of care       GO                 Mahala Menghini., M.A. CCC-SLP Acute Rehabilitation Services Pager  [phone] Office [phone]  08/17/2019, 9:57 AM

## 2019-08-17 NOTE — Progress Notes (Signed)
Inpatient Rehabilitation-Admissions Coordinator   Was notified by pt's insurance company that they are requesting updated clinicals as well as a peer to peer prior to making a final determination regarding this patient's CIR request. AC has notified attending service and they are willing to complete peer to peer conference. AC will send updated therapy notes as they are completed. Have notified therapy services of need for notes.   Will update once there has been a final determination.   Cheri Rous, OTR/L  Rehab Admissions Coordinator  [phone] 08/17/2019 4:32 PM

## 2019-08-18 ENCOUNTER — Other Ambulatory Visit: Payer: Self-pay

## 2019-08-18 ENCOUNTER — Encounter (HOSPITAL_COMMUNITY): Payer: Self-pay | Admitting: Emergency Medicine

## 2019-08-18 ENCOUNTER — Inpatient Hospital Stay (HOSPITAL_COMMUNITY)
Admission: RE | Admit: 2019-08-18 | Discharge: 2019-09-23 | DRG: 945 | Disposition: A | Discharge status: Skilled Nursing Facility | Payer: Medicare Other | Source: Intra-hospital | Attending: Physical Medicine & Rehabilitation | Admitting: Physical Medicine & Rehabilitation

## 2019-08-18 ENCOUNTER — Encounter (HOSPITAL_COMMUNITY): Payer: Self-pay | Admitting: Physical Medicine & Rehabilitation

## 2019-08-18 DIAGNOSIS — S069X9A Unspecified intracranial injury with loss of consciousness of unspecified duration, initial encounter: Secondary | ICD-10-CM | POA: Diagnosis present

## 2019-08-18 DIAGNOSIS — W108XXD Fall (on) (from) other stairs and steps, subsequent encounter: Secondary | ICD-10-CM | POA: Diagnosis present

## 2019-08-18 DIAGNOSIS — R451 Restlessness and agitation: Secondary | ICD-10-CM | POA: Diagnosis not present

## 2019-08-18 DIAGNOSIS — R1310 Dysphagia, unspecified: Secondary | ICD-10-CM | POA: Diagnosis present

## 2019-08-18 DIAGNOSIS — E440 Moderate protein-calorie malnutrition: Secondary | ICD-10-CM | POA: Diagnosis present

## 2019-08-18 DIAGNOSIS — Z87891 Personal history of nicotine dependence: Secondary | ICD-10-CM

## 2019-08-18 DIAGNOSIS — G479 Sleep disorder, unspecified: Secondary | ICD-10-CM | POA: Diagnosis present

## 2019-08-18 DIAGNOSIS — R4189 Other symptoms and signs involving cognitive functions and awareness: Secondary | ICD-10-CM | POA: Diagnosis present

## 2019-08-18 DIAGNOSIS — S062X9D Diffuse traumatic brain injury with loss of consciousness of unspecified duration, subsequent encounter: Secondary | ICD-10-CM | POA: Diagnosis not present

## 2019-08-18 DIAGNOSIS — S066X9D Traumatic subarachnoid hemorrhage with loss of consciousness of unspecified duration, subsequent encounter: Secondary | ICD-10-CM | POA: Diagnosis not present

## 2019-08-18 DIAGNOSIS — R4587 Impulsiveness: Secondary | ICD-10-CM | POA: Diagnosis present

## 2019-08-18 DIAGNOSIS — E119 Type 2 diabetes mellitus without complications: Secondary | ICD-10-CM | POA: Diagnosis not present

## 2019-08-18 DIAGNOSIS — R4701 Aphasia: Secondary | ICD-10-CM | POA: Diagnosis not present

## 2019-08-18 DIAGNOSIS — R739 Hyperglycemia, unspecified: Secondary | ICD-10-CM | POA: Diagnosis not present

## 2019-08-18 DIAGNOSIS — Z20822 Contact with and (suspected) exposure to covid-19: Secondary | ICD-10-CM | POA: Diagnosis present

## 2019-08-18 DIAGNOSIS — E871 Hypo-osmolality and hyponatremia: Secondary | ICD-10-CM | POA: Diagnosis not present

## 2019-08-18 DIAGNOSIS — D709 Neutropenia, unspecified: Secondary | ICD-10-CM | POA: Diagnosis present

## 2019-08-18 DIAGNOSIS — H54415A Blindness right eye category 5, normal vision left eye: Secondary | ICD-10-CM | POA: Diagnosis not present

## 2019-08-18 DIAGNOSIS — H5461 Unqualified visual loss, right eye, normal vision left eye: Secondary | ICD-10-CM | POA: Diagnosis present

## 2019-08-18 DIAGNOSIS — F1010 Alcohol abuse, uncomplicated: Secondary | ICD-10-CM | POA: Diagnosis present

## 2019-08-18 DIAGNOSIS — S065XAA Traumatic subdural hemorrhage with loss of consciousness status unknown, initial encounter: Secondary | ICD-10-CM

## 2019-08-18 DIAGNOSIS — Z781 Physical restraint status: Secondary | ICD-10-CM

## 2019-08-18 DIAGNOSIS — S069X0S Unspecified intracranial injury without loss of consciousness, sequela: Secondary | ICD-10-CM

## 2019-08-18 DIAGNOSIS — J439 Emphysema, unspecified: Secondary | ICD-10-CM | POA: Diagnosis present

## 2019-08-18 DIAGNOSIS — Z6824 Body mass index (BMI) 24.0-24.9, adult: Secondary | ICD-10-CM

## 2019-08-18 DIAGNOSIS — S2249XA Multiple fractures of ribs, unspecified side, initial encounter for closed fracture: Secondary | ICD-10-CM | POA: Diagnosis present

## 2019-08-18 DIAGNOSIS — S069X3S Unspecified intracranial injury with loss of consciousness of 1 hour to 5 hours 59 minutes, sequela: Secondary | ICD-10-CM

## 2019-08-18 DIAGNOSIS — S0281XD Fracture of other specified skull and facial bones, right side, subsequent encounter for fracture with routine healing: Secondary | ICD-10-CM | POA: Diagnosis not present

## 2019-08-18 DIAGNOSIS — R2689 Other abnormalities of gait and mobility: Secondary | ICD-10-CM | POA: Diagnosis not present

## 2019-08-18 DIAGNOSIS — S7001XD Contusion of right hip, subsequent encounter: Secondary | ICD-10-CM

## 2019-08-18 DIAGNOSIS — S065X9D Traumatic subdural hemorrhage with loss of consciousness of unspecified duration, subsequent encounter: Secondary | ICD-10-CM | POA: Diagnosis not present

## 2019-08-18 DIAGNOSIS — Z87828 Personal history of other (healed) physical injury and trauma: Secondary | ICD-10-CM | POA: Diagnosis not present

## 2019-08-18 DIAGNOSIS — R262 Difficulty in walking, not elsewhere classified: Secondary | ICD-10-CM | POA: Diagnosis not present

## 2019-08-18 DIAGNOSIS — S0219XD Other fracture of base of skull, subsequent encounter for fracture with routine healing: Secondary | ICD-10-CM

## 2019-08-18 DIAGNOSIS — S069XAA Unspecified intracranial injury with loss of consciousness status unknown, initial encounter: Secondary | ICD-10-CM | POA: Diagnosis present

## 2019-08-18 DIAGNOSIS — R1312 Dysphagia, oropharyngeal phase: Secondary | ICD-10-CM | POA: Diagnosis not present

## 2019-08-18 DIAGNOSIS — S2243XD Multiple fractures of ribs, bilateral, subsequent encounter for fracture with routine healing: Secondary | ICD-10-CM | POA: Diagnosis not present

## 2019-08-18 DIAGNOSIS — M6281 Muscle weakness (generalized): Secondary | ICD-10-CM | POA: Diagnosis not present

## 2019-08-18 LAB — GLUCOSE, CAPILLARY
Glucose-Capillary: 128 mg/dL — ABNORMAL HIGH (ref 70–99)
Glucose-Capillary: 121 mg/dL — ABNORMAL HIGH (ref 70–99)
Glucose-Capillary: 121 mg/dL — ABNORMAL HIGH (ref 70–99)
Glucose-Capillary: 118 mg/dL — ABNORMAL HIGH (ref 70–99)
Glucose-Capillary: 127 mg/dL — ABNORMAL HIGH (ref 70–99)

## 2019-08-18 MED ORDER — ALUM & MAG HYDROXIDE-SIMETH 200-200-20 MG/5ML PO SUSP: 30.0000 mL | ORAL | Status: DC | PRN

## 2019-08-18 MED ORDER — ACETAMINOPHEN 325 MG PO TABS
325.0000 mg | ORAL_TABLET | ORAL | Status: DC | PRN
  Administered 2019-08-20 – 2019-09-18 (×4): 650 mg via ORAL
  Filled 2019-08-18 (×7): qty 2

## 2019-08-18 MED ORDER — ORAL CARE MOUTH RINSE
15.0000 mL | Freq: Two times a day (BID) | OROMUCOSAL | Status: DC
  Administered 2019-08-19 – 2019-09-18 (×28): 15 mL via OROMUCOSAL

## 2019-08-18 MED ORDER — TRAMADOL HCL 50 MG PO TABS
50.0000 mg | ORAL_TABLET | Freq: Four times a day (QID) | ORAL | Status: DC | PRN
  Administered 2019-08-18 – 2019-08-31 (×4): 50 mg via ORAL
  Filled 2019-08-18 (×4): qty 1

## 2019-08-18 MED ORDER — QUETIAPINE FUMARATE 25 MG PO TABS
25.0000 mg | ORAL_TABLET | Freq: Two times a day (BID) | ORAL | Status: DC
  Administered 2019-08-18 – 2019-08-20 (×4): 25 mg via ORAL
  Filled 2019-08-18 (×4): qty 1

## 2019-08-18 MED ORDER — ACETAMINOPHEN 325 MG PO TABS
650.0000 mg | ORAL_TABLET | Freq: Three times a day (TID) | ORAL | Status: DC
  Administered 2019-08-18 – 2019-09-20 (×91): 650 mg via ORAL
  Filled 2019-08-18 (×112): qty 2

## 2019-08-18 MED ORDER — PROCHLORPERAZINE 25 MG RE SUPP: 12.5000 mg | Freq: Four times a day (QID) | RECTAL | Status: DC | PRN

## 2019-08-18 MED ORDER — GUAIFENESIN-DM 100-10 MG/5ML PO SYRP: 5.0000 mL | ORAL_SOLUTION | Freq: Four times a day (QID) | ORAL | Status: DC | PRN

## 2019-08-18 MED ORDER — CLONAZEPAM 0.25 MG PO TBDP
0.2500 mg | ORAL_TABLET | Freq: Every day | ORAL | Status: DC
  Administered 2019-08-18 – 2019-08-29 (×12): 0.25 mg via ORAL
  Filled 2019-08-18 (×12): qty 1

## 2019-08-18 MED ORDER — ADULT MULTIVITAMIN W/MINERALS CH
1.0000 | ORAL_TABLET | Freq: Every day | ORAL | Status: DC
  Administered 2019-08-19 – 2019-09-17 (×24): 1 via ORAL
  Filled 2019-08-18 (×32): qty 1

## 2019-08-18 MED ORDER — METHOCARBAMOL 500 MG PO TABS
500.0000 mg | ORAL_TABLET | Freq: Four times a day (QID) | ORAL | Status: DC | PRN
  Administered 2019-08-19 – 2019-09-13 (×11): 500 mg via ORAL
  Filled 2019-08-18 (×13): qty 1

## 2019-08-18 MED ORDER — POLYETHYLENE GLYCOL 3350 17 G PO PACK: 17.0000 g | PACK | Freq: Every day | ORAL | Status: DC | PRN

## 2019-08-18 MED ORDER — ENOXAPARIN SODIUM 40 MG/0.4ML ~~LOC~~ SOLN: 40.0000 mg | SUBCUTANEOUS | Status: DC

## 2019-08-18 MED ORDER — FLEET ENEMA 7-19 GM/118ML RE ENEM: 1.0000 | ENEMA | Freq: Once | RECTAL | Status: DC | PRN

## 2019-08-18 MED ORDER — OXYCODONE HCL 5 MG PO TABS
5.0000 mg | ORAL_TABLET | ORAL | Status: DC | PRN
  Administered 2019-08-19 – 2019-08-25 (×6): 5 mg via ORAL
  Filled 2019-08-18 (×6): qty 1

## 2019-08-18 MED ORDER — POLYETHYLENE GLYCOL 3350 17 G PO PACK
17.0000 g | PACK | Freq: Every day | ORAL | Status: DC
  Administered 2019-08-19 – 2019-09-23 (×23): 17 g via ORAL
  Filled 2019-08-18 (×28): qty 1

## 2019-08-18 MED ORDER — PIVOT 1.5 CAL PO LIQD
1000.0000 mL | ORAL | Status: DC
  Administered 2019-08-18: 1000 mL
  Filled 2019-08-18: qty 1000

## 2019-08-18 MED ORDER — QUETIAPINE FUMARATE 50 MG PO TABS
25.0000 mg | ORAL_TABLET | Freq: Two times a day (BID) | ORAL | Status: DC
  Administered 2019-08-18: 25 mg via ORAL
  Filled 2019-08-18: qty 1

## 2019-08-18 MED ORDER — BISACODYL 10 MG RE SUPP
10.0000 mg | Freq: Every day | RECTAL | Status: DC | PRN
  Filled 2019-08-18 (×2): qty 1

## 2019-08-18 MED ORDER — DIPHENHYDRAMINE HCL 12.5 MG/5ML PO ELIX
12.5000 mg | ORAL_SOLUTION | Freq: Four times a day (QID) | ORAL | Status: DC | PRN
  Administered 2019-08-20 – 2019-09-16 (×5): 25 mg via ORAL
  Filled 2019-08-18 (×5): qty 10
  Filled 2019-08-18: qty 0

## 2019-08-18 MED ORDER — CLONAZEPAM 0.25 MG PO TBDP: 0.2500 mg | ORAL_TABLET | Freq: Every day | ORAL | Status: DC

## 2019-08-18 MED ORDER — THIAMINE HCL 100 MG PO TABS
100.0000 mg | ORAL_TABLET | Freq: Every day | ORAL | Status: DC
  Administered 2019-08-19 – 2019-09-23 (×25): 100 mg via ORAL
  Filled 2019-08-18 (×31): qty 1

## 2019-08-18 MED ORDER — DOCUSATE SODIUM 100 MG PO CAPS
100.0000 mg | ORAL_CAPSULE | Freq: Two times a day (BID) | ORAL | Status: DC
  Administered 2019-08-18 – 2019-08-23 (×10): 100 mg via ORAL
  Filled 2019-08-18 (×11): qty 1

## 2019-08-18 MED ORDER — CHLORHEXIDINE GLUCONATE 0.12 % MT SOLN
15.0000 mL | Freq: Two times a day (BID) | OROMUCOSAL | Status: DC
  Administered 2019-08-18 – 2019-09-17 (×30): 15 mL via OROMUCOSAL
  Filled 2019-08-18 (×53): qty 15

## 2019-08-18 MED ORDER — PROCHLORPERAZINE EDISYLATE 10 MG/2ML IJ SOLN: 5.0000 mg | Freq: Four times a day (QID) | INTRAMUSCULAR | Status: DC | PRN

## 2019-08-18 MED ORDER — FOLIC ACID 1 MG PO TABS
1.0000 mg | ORAL_TABLET | Freq: Every day | ORAL | Status: DC
  Administered 2019-08-19 – 2019-09-17 (×24): 1 mg via ORAL
  Filled 2019-08-18 (×31): qty 1

## 2019-08-18 MED ORDER — PROCHLORPERAZINE MALEATE 5 MG PO TABS: 5.0000 mg | ORAL_TABLET | Freq: Four times a day (QID) | ORAL | Status: DC | PRN

## 2019-08-18 MED ORDER — ENOXAPARIN SODIUM 30 MG/0.3ML ~~LOC~~ SOLN
30.0000 mg | Freq: Two times a day (BID) | SUBCUTANEOUS | Status: DC
  Administered 2019-08-18 – 2019-09-17 (×49): 30 mg via SUBCUTANEOUS
  Filled 2019-08-18 (×69): qty 0.3

## 2019-08-18 NOTE — Progress Notes (Signed)
Inpatient Rehabilitation-Admissions Coordinator   I have received insurance approval and medical clearance from attending service for admit to CIR today. Pt willing to proceed and I have notified his sister and girlfriend who are also in agreement. Reviewed insurance benefits letter and consent forms with the sister and all questions answered. AC has notified TOC and RN of plan for admit today. Please call if questions.   Cheri Rous, OTR/L  Rehab Admissions Coordinator  [phone] 08/18/2019 3:18 PM
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
## 2019-08-18 NOTE — TOC Transition Note (Signed)
Transition of Care [facility]) - CM/SW Discharge Note   Patient Details  Name: Jeremiah West MRN: [id number] Date of Birth: 02-01-1951  [facility]) CM/SW Contact:  Glennon Mac, RN Phone Number: 08/18/2019, 4:08 PM   Clinical Narrative:  Pt medically stable for discharge and insurance authorization has been received for admission to [facility] IP Rehab today; plan dc to CIR when bed available.     Final next level of care: IP Rehab Facility Barriers to Discharge: Barriers Resolved   Patient Goals and CMS Choice Patient states their goals for this hospitalization and ongoing recovery are:: Pt is unable to verbalize goals. CMS Medicare.gov Compare Post Acute Care list provided to:: Patient Choice offered to / list presented to : Patient, Sibling  Discharge Placement                       Discharge Plan and Services   Discharge Planning Services: CM Consult Post Acute Care Choice: IP Rehab                               Social Determinants of Health (SDOH) Interventions     Readmission Risk Interventions No flowsheet data found.  Quintella Baton, RN, BSN  Trauma/Neuro ICU Case Manager [phone]

## 2019-08-18 NOTE — Social Work (Signed)
CSW received call from pts sister. [name] requested an update about CIR. CSW informed sharon that they are waiting for insurance auth and a peer to peer review.    3:35om- CSW called sister and left VM stating pt insurance Berkley Harvey has came back and pt will be moved today.   Jimmy Picket, Theresia Majors, [place] Clinical Social Worker [phone]
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
## 2019-08-18 NOTE — Plan of Care (Signed)
?  Problem: Education: ?Goal: Knowledge of General Education information will improve ?Description: Including pain rating scale, medication(s)/side effects and non-pharmacologic comfort measures ?Outcome: Progressing ?  ?Problem: Health Behavior/Discharge Planning: ?Goal: Ability to manage health-related needs will improve ?Outcome: Progressing ?  ?Problem: Clinical Measurements: ?Goal: Ability to maintain clinical measurements within normal limits will improve ?Outcome: Progressing ?Goal: Will remain free from infection ?Outcome: Progressing ?Goal: Respiratory complications will improve ?Outcome: Progressing ?Goal: Cardiovascular complication will be avoided ?Outcome: Progressing ?  ?Problem: Activity: ?Goal: Risk for activity intolerance will decrease ?Outcome: Progressing ?  ?Problem: Nutrition: ?Goal: Adequate nutrition will be maintained ?Outcome: Progressing ?  ?Problem: Coping: ?Goal: Level of anxiety will decrease ?Outcome: Progressing ?  ?Problem: Pain Managment: ?Goal: General experience of comfort will improve ?Outcome: Progressing ?  ?Problem: Safety: ?Goal: Ability to remain free from injury will improve ?Outcome: Progressing ?  ?

## 2019-08-18 NOTE — Progress Notes (Signed)
Report called to 4W. Pt transported via bed with all belongings.
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
## 2019-08-18 NOTE — Progress Notes (Signed)
[facility] Progress Note     Subjective: CC-  Tired this morning. Per patient's nurse after klonopin he is very sleepy for 1 hour, then he becomes more interactive and animated. Working well with therapies.   Objective: Vital signs in last 24 hours: Temp:  [98.3 F (36.8 C)-98.8 F (37.1 C)] 98.3 F (36.8 C) (04/29 0319) Pulse Rate:  [90-93] 90 (04/28 1625) Resp:  [20-22] 22 (04/28 1625) BP: (142-156)/(91-95) 142/91 (04/28 1625) SpO2:  [97 %-100 %] 97 % (04/28 1625) Last BM Date: 08/16/19  Intake/Output from previous day: 04/28 0701 - 04/29 0700 In: 1082.5 [P.O.:720; NG/GT:362.5] Out: 1050 [Urine:1050] Intake/Output this shift: No intake/output data recorded.  PE: Gen: Alert but lethargic, NAD, restrained HEENT:right eye closed Card: RRR, no M/G/R heard, 2+ DP pulses Pulm: CTAB, no W/R/R, rate and effort normalonroom air Abd: Soft,protuberant, nontender, +BS Ext: no BUE/BLE edema, calves soft and nontender Psych:unable to assess as patient is lethargic Neuro: moving all 4 extremitiesand followssome simplecommands Skin: no rashes noted, warm and dry   Lab Results:  Recent Labs    08/15/19 0903  WBC 5.3  HGB 14.6  HCT 43.9  PLT 238   BMET Recent Labs    08/15/19 0903  NA 137  K 4.2  CL 101  CO2 24  GLUCOSE 137*  BUN 18  CREATININE 0.84  CALCIUM 9.8   PT/INR No results for input(s): LABPROT, INR in the last 72 hours. CMP     Component Value Date/Time   NA 137 08/15/2019 0903   K 4.2 08/15/2019 0903   CL 101 08/15/2019 0903   CO2 24 08/15/2019 0903   GLUCOSE 137 (H) 08/15/2019 0903   BUN 18 08/15/2019 0903   CREATININE 0.84 08/15/2019 0903   CALCIUM 9.8 08/15/2019 0903   PROT 8.4 (H) 08/06/2019 2123   ALBUMIN 4.3 08/06/2019 2123   AST 34 08/06/2019 2123   ALT 32 08/06/2019 2123   ALKPHOS 80 08/06/2019 2123   BILITOT 0.4 08/06/2019 2123   GFRNONAA >60 08/15/2019 0903   GFRAA >60 08/15/2019 0903   Lipase  No  results found for: LIPASE     Studies/Results: No results found.  Anti-infectives: Anti-infectives (From admission, onward)   None       Assessment/Plan Fall down stairs  TBI/L SDH/SAH-f/uCT stable 4/18,NSGY c/s,Dr. Conchita Paris.CompletedKeppra x7d for sz ppx.TBI team therapies, neuro exam improved Temporal bone FX- NSGY c/s as above Hypokalemia-resolved Hyperglycemia - mild, A1c 5.1 Alcohol intoxication - SW for SBIRT, CIWA FEN- KVOIVF, Ensure,Bolus TF, Ensure, Dys2thin liquids diet/ working with SLP- advance per speech. Decrease klonopin 0.25mg  to qhs, add seroquel 25mg  BID VTE- PAS,LMWH Foley - none Follow up - NS Dispo-Continue therapies. Stable for discharge to CIR when bed available. Dr. working on peer to peer.   LOS: 12 days    Bedelia Person, [facility] Surgery 08/18/2019, 8:02 AM Please see Amion for pager number during day hours 7:00am-4:30pm

## 2019-08-18 NOTE — Progress Notes (Signed)
Nutrition Follow-up  DOCUMENTATION CODES:   Non-severe (moderate) malnutrition in context of chronic illness  INTERVENTION:  Recommend letting pt attempt to eat at meals, If meal completion at the meal is <50%, provide a bolus tube feed via Cortrak NGT using Osmolite 1.2 formula at volume of 237 ml (1 carton/ARC) given up to 3 times a day. Each bolus volume provides 284 kcal and 13 grams of protein.   Continue Ensure Enlive po BID, each supplement provides 350 kcal and 20 grams of protein.  Encourage adequate PO intake.   NUTRITION DIAGNOSIS:   Moderate Malnutrition related to chronic illness(chronic medical issues and/or ETOH abuse) as evidenced by mild fat depletion, moderate muscle depletion; ongoing  GOAL:   Patient will meet greater than or equal to 90% of their needs; progressing  MONITOR:   PO intake, Supplement acceptance, Diet advancement, Skin, TF tolerance, Weight trends, Labs, I & O's  REASON FOR ASSESSMENT:   Consult Enteral/tube feeding initiation and management  ASSESSMENT:   Pt intoxicated and admitted s/p fall down 15 steps with TBI/L SDH/SAH, and temporal bone fx. Pt with known ETOH abuse with findings of aortic atherosclerosis, emphysema, and CHF/volume overload with cardiomegaly found per MD on exam. Cortrak NGT placed 4/19. Tip of tube in stomach.   Meal completion has been varied 10-100% with most intake 50-100%. Pt continues on a dysphagia 2 diet with thin liquids. Pt with restraints on during time of visit. Pt reports having a good appetite with no abdominal discomfort. Pt currently has Ensure ordered and has been consuming them. Pt currently has Pivot 1.5 formula infusing at continuous rate of 25 ml/hr via Cortrak NGT. RD consulted to transition tube feeds to bolus feeds. Plans to discharge to CIR today. Recommend letting pt attempt to eat at meals and if meal completion is <50% to provide a bolus tube feed using Osmolite 1.2 formula at rate of 237 ml up to  3 times daily.   Labs and medications reviewed.   Diet Order:   Diet Order            DIET DYS 2 Room service appropriate? Yes; Fluid consistency: Thin  Diet effective now              EDUCATION NEEDS:   No education needs have been identified at this time  Skin:  Skin Assessment: Reviewed RN Assessment  Last BM:  4/28  Height:   Ht Readings from Last 1 Encounters:  08/06/19 5\' 8"  (1.727 m)    Weight:   Wt Readings from Last 1 Encounters:  08/17/19 70.8 kg    Ideal Body Weight:  70 kg  BMI:  Body mass index is 23.73 kg/m.  Estimated Nutritional Needs:   Kcal:  1900-2100  Protein:  95-110 grams  Fluid:  2 L/day    08/19/19, MS, RD, LDN RD pager number/after hours weekend pager number on Amion.
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
## 2019-08-19 ENCOUNTER — Inpatient Hospital Stay (HOSPITAL_COMMUNITY): Payer: Medicare Other | Admitting: Physical Therapy

## 2019-08-19 ENCOUNTER — Inpatient Hospital Stay (HOSPITAL_COMMUNITY): Payer: Medicare Other

## 2019-08-19 ENCOUNTER — Inpatient Hospital Stay (HOSPITAL_COMMUNITY): Payer: Medicare Other | Admitting: Speech Pathology

## 2019-08-19 DIAGNOSIS — R1312 Dysphagia, oropharyngeal phase: Secondary | ICD-10-CM | POA: Diagnosis not present

## 2019-08-19 DIAGNOSIS — F1010 Alcohol abuse, uncomplicated: Secondary | ICD-10-CM | POA: Diagnosis not present

## 2019-08-19 DIAGNOSIS — E871 Hypo-osmolality and hyponatremia: Secondary | ICD-10-CM

## 2019-08-19 DIAGNOSIS — S069X3S Unspecified intracranial injury with loss of consciousness of 1 hour to 5 hours 59 minutes, sequela: Secondary | ICD-10-CM | POA: Diagnosis not present

## 2019-08-19 LAB — CBC WITH DIFFERENTIAL/PLATELET
Abs Immature Granulocytes: 0.05 10*3/uL (ref 0.00–0.07)
Basophils Absolute: 0 10*3/uL (ref 0.0–0.1)
Basophils Relative: 1 %
Eosinophils Absolute: 0.1 10*3/uL (ref 0.0–0.5)
Eosinophils Relative: 2 %
HCT: 41.9 % (ref 39.0–52.0)
Hemoglobin: 14.2 g/dL (ref 13.0–17.0)
Immature Granulocytes: 1 %
Lymphocytes Relative: 19 %
Lymphs Abs: 1.2 10*3/uL (ref 0.7–4.0)
MCH: 33.7 pg (ref 26.0–34.0)
MCHC: 33.9 g/dL (ref 30.0–36.0)
MCV: 99.5 fL (ref 80.0–100.0)
Monocytes Absolute: 0.6 10*3/uL (ref 0.1–1.0)
Monocytes Relative: 9 %
Neutro Abs: 4.2 10*3/uL (ref 1.7–7.7)
Neutrophils Relative %: 68 %
Platelets: 299 10*3/uL (ref 150–400)
RBC: 4.21 MIL/uL — ABNORMAL LOW (ref 4.22–5.81)
RDW: 11.8 % (ref 11.5–15.5)
WBC: 6.2 10*3/uL (ref 4.0–10.5)
nRBC: 0 % (ref 0.0–0.2)

## 2019-08-19 LAB — COMPREHENSIVE METABOLIC PANEL
ALT: 56 U/L — ABNORMAL HIGH (ref 0–44)
AST: 31 U/L (ref 15–41)
Albumin: 3.5 g/dL (ref 3.5–5.0)
Alkaline Phosphatase: 73 U/L (ref 38–126)
Anion gap: 10 (ref 5–15)
BUN: 22 mg/dL (ref 8–23)
CO2: 27 mmol/L (ref 22–32)
Calcium: 9.7 mg/dL (ref 8.9–10.3)
Chloride: 96 mmol/L — ABNORMAL LOW (ref 98–111)
Creatinine, Ser: 0.96 mg/dL (ref 0.61–1.24)
GFR calc Af Amer: >60 mL/min (ref >60)
GFR calc non Af Amer: >60 mL/min (ref >60)
Glucose, Bld: 124 mg/dL — ABNORMAL HIGH (ref 70–99)
Potassium: 4 mmol/L (ref 3.5–5.1)
Sodium: 133 mmol/L — ABNORMAL LOW (ref 135–145)
Total Bilirubin: 0.8 mg/dL (ref 0.3–1.2)
Total Protein: 7.6 g/dL (ref 6.5–8.1)

## 2019-08-19 LAB — HEMOGLOBIN A1C
Hgb A1c MFr Bld: 5.2 % (ref 4.8–5.6)
Mean Plasma Glucose: 102.54 mg/dL

## 2019-08-19 LAB — GLUCOSE, CAPILLARY
Glucose-Capillary: 101 mg/dL — ABNORMAL HIGH (ref 70–99)
Glucose-Capillary: 107 mg/dL — ABNORMAL HIGH (ref 70–99)
Glucose-Capillary: 114 mg/dL — ABNORMAL HIGH (ref 70–99)
Glucose-Capillary: 132 mg/dL — ABNORMAL HIGH (ref 70–99)
Glucose-Capillary: 136 mg/dL — ABNORMAL HIGH (ref 70–99)

## 2019-08-19 MED ORDER — ENSURE ENLIVE PO LIQD
237.0000 mL | Freq: Three times a day (TID) | ORAL | Status: DC
  Administered 2019-08-19 – 2019-09-20 (×67): 237 mL via ORAL

## 2019-08-19 MED ORDER — PIVOT 1.5 CAL PO LIQD
450.0000 mL | ORAL | Status: DC
  Administered 2019-08-19 – 2019-08-20 (×2): 450 mL
  Filled 2019-08-19 (×3): qty 1000

## 2019-08-19 NOTE — Progress Notes (Signed)
Pt c/o of 10/10 head pain. PRN medication effective. Pt resting in bed at this time.

## 2019-08-19 NOTE — Progress Notes (Signed)
Occupational Therapy Session Note  Patient Details  Name: Jeremiah West MRN: [id number] Date of Birth: 1950-08-08  Today's Date: 08/19/2019 OT Individual Time: 0539-7673 OT Individual Time Calculation (min): 40 min    Short Term Goals: Week 1:  OT Short Term Goal 1 (Week 1): Pt will consistently transfer to toilet wiht MIN A OT Short Term Goal 2 (Week 1): Pt will thread BLE into pants demoing improved dressing praxis OT Short Term Goal 3 (Week 1): Pt will stand to groom with MIN A and MOD VC for sequncing OT Short Term Goal 4 (Week 1): Pt will bathe UB with MOD VC for initation  Skilled Therapeutic Interventions/Progress Updates:    1:1. Pt received in w/c at RN station. Pt completes variety of cognitive tabletop tasks and UE assessments. Pt completes pipe tree with pieces already selected with 4/5 pieces accurately placed with MIN VC to refer to picture. Pt requires MOD HOH A facilitation of RUE to pull pieces apart and release into bucket for cleaning d/t decreased motor planning. BUE coordination worked on by ball toss activity chest pass, bounce pass and overhead pass with mirror used for visual feedback/awareness that RUE weaker than LUE. Pt states, "my arm is ruined" and insists it "isnt the fall [because of]." Pt completes RUE dynamometer 45 pound average and LUE 60 pound average. Pt able to follow directions for box and blocks assessment after OT completes HOH A to start motor movement of RUE grasp/release single block at a time (8 blocks per minute) however unable to follow instructions for LUE. Exited session with pt seated in w/c at RN station, exit alarm/waise restraint on and call light in reach.   Therapy Documentation Precautions:  Precautions Precautions: Fall Restrictions Weight Bearing Restrictions: No General:   Vital Signs: Therapy Vitals Temp: 98 F (36.7 C) Pulse Rate: 88 Resp: 17 BP: 116/81 Patient Position (if appropriate): Lying Oxygen Therapy SpO2: 97  % O2 Device: Room Air Pain:   ADL: ADL Grooming: Moderate assistance Where Assessed-Grooming: Standing at sink Upper Body Bathing: Maximal assistance Where Assessed-Upper Body Bathing: Sitting at sink Lower Body Bathing: Maximal assistance Where Assessed-Lower Body Bathing: Sitting at sink Upper Body Dressing: Moderate assistance Where Assessed-Upper Body Dressing: Sitting at sink Lower Body Dressing: Maximal assistance Where Assessed-Lower Body Dressing: Sitting at sink, Standing at sink Toilet Transfer: Minimal assistance Toilet Transfer Method: Stand pivot Toilet Transfer Equipment: Grab bars Vision   Perception    Praxis Praxis: Impaired Praxis Impairment Details: Motor planning;Initiation;Ideomotor;Perseveration;Ideation Exercises:   Other Treatments:     Therapy/Group: Individual Therapy  Shon Hale 08/19/2019, 4:04 PM
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
## 2019-08-19 NOTE — IPOC Note (Addendum)
Overall Plan of Care [facility]) Patient Details Name: Jeremiah West MRN: [id number] DOB: 1950/11/04  Admitting Diagnosis: TBI (traumatic brain injury) [facility])  Hospital Problems: Principal Problem:   TBI (traumatic brain injury) ([place]) Active Problems:   Hyponatremia   Hyperglycemia   Sleep disturbance   Dysphagia   Impulsive   Agitation     Functional Problem List: Nursing Behavior, Bladder, Bowel, Medication Management, Nutrition, Perception, Safety  PT Balance, Behavior, Endurance, Motor, Perception, Safety  OT Balance, Behavior, Cognition, Endurance, Motor, Nutrition, Perception, Safety, Sensory, Vision  SLP Cognition, Nutrition, Behavior  TR         Basic ADL's: OT Grooming, Bathing, Eating, Dressing, Toileting     Advanced  ADL's: OT       Transfers: PT Bed Mobility, Bed to Chair, Car, Furniture, Floor  OT Tub/Shower, Agricultural engineer: PT Stairs, Ambulation, Wheelchair Mobility     Additional Impairments: OT    SLP Communication, Swallowing, Social Cognition comprehension, expression Social Interaction, Problem Solving, Memory, Attention, Awareness  TR      Anticipated Outcomes Item Anticipated Outcome  Self Feeding S  Swallowing  Supervision   Basic self-care  S  Toileting  S   Bathroom Transfers S  Bowel/Bladder  Patient to be Min I  Transfers  supervision  Locomotion  supervision household gait w/ LRAD  Communication  Min A  Cognition  Min A  Pain  pain goal of 2 out of 0-10  Safety/Judgment  min I for patient   Therapy Plan: PT Intensity: Minimum of 1-2 x/day ,45 to 90 minutes PT Frequency: 5 out of 7 days PT Duration Estimated Length of Stay: 2-3 weeks OT Intensity: Minimum of 1-2 x/day, 45 to 90 minutes OT Frequency: 5 out of 7 days OT Duration/Estimated Length of Stay: 2.5-3 weeks SLP Intensity: Minumum of 1-2 x/day, 30 to 90 minutes SLP Frequency: 3 to 5 out of 7 days SLP Duration/Estimated Length of Stay: 3 weeks    Due to the current state of emergency, patients may not be receiving their 3-hours of Medicare-mandated therapy.   Team Interventions: Nursing Interventions Bladder Management, Bowel Management, Medication Management, Cognitive Remediation/Compensation, Dysphagia/Aspiration Precaution Training, Discharge Planning  PT interventions Ambulation/gait training, DME/adaptive equipment instruction, Community reintegration, Neuromuscular re-education, Psychosocial support, Stair training, UE/LE Strength taining/ROM, Wheelchair propulsion/positioning, Therapeutic Activities, UE/LE Coordination activities, Skin care/wound management, Pain management, Functional electrical stimulation, Discharge planning, Balance/vestibular training, Cognitive remediation/compensation, Disease management/prevention, Functional mobility training, Patient/family education, Splinting/orthotics, Therapeutic Exercise, Visual/perceptual remediation/compensation  OT Interventions Balance/vestibular training, Discharge planning, Pain management, Self Care/advanced ADL retraining, Therapeutic Activities, UE/LE Coordination activities, Visual/perceptual remediation/compensation, Therapeutic Exercise, Skin care/wound managment, Patient/family education, Functional mobility training, Disease mangement/prevention, Cognitive remediation/compensation, Academic librarian, Engineer, drilling, Neuromuscular re-education, Psychosocial support, UE/LE Strength taining/ROM, Wheelchair propulsion/positioning  SLP Interventions Cognitive remediation/compensation, Internal/external aids, Dysphagia/aspiration precaution training, Speech/Language facilitation, Therapeutic Activities, Environmental controls, Cueing hierarchy, Functional tasks, Patient/family education  TR Interventions    SW/CM Interventions Discharge Planning, Psychosocial Support, Patient/Family Education   Barriers to Discharge MD  Medical stability  Nursing  Decreased caregiver support, Incontinence, Lack of/limited family support, Behavior    PT Inaccessible home environment, Behavior, Home environment access/layout TBI, has 4 steps w/o rails to access home, bed and bath on 2nd floor  OT Inaccessible home environment, Decreased caregiver support, Home environment access/layout, Behavior, Lack of/limited family support, Incontinence    SLP      SW       Team Discharge Planning: Destination: PT-Home ,OT- Home ,  SLP-Home Projected Follow-up: PT-Home health PT, OT-  Home health OT, Skilled nursing facility(unsure if family able to provide A), SLP-Home Health SLP, 24 hour supervision/assistance Projected Equipment Needs: PT-To be determined, OT- To be determined, SLP-None recommended by SLP2 Equipment Details: PT- , OT-  Patient/family involved in discharge planning: PT- Patient unable/family or caregiver not available,  OT- , SLP-Patient  MD ELOS: 15-21 days Medical Rehab Prognosis:  Good Assessment: The patient has been admitted for CIR therapies with the diagnosis of TBI. The team will be addressing functional mobility, strength, stamina, balance, safety, adaptive techniques and equipment, self-care, bowel and bladder mgt, patient and caregiver education, NMR, cognition, behavior, swallowing. Goals have been set at supervision with mobility and self-care and min assist with cognition. Hx of sig etoh abuse..   Due to the current state of emergency, patients may not be receiving their 3 hours per day of Medicare-mandated therapy.    Ranelle Oyster, MD, FAAPMR      See Team Conference Notes for weekly updates to the plan of care
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
## 2019-08-19 NOTE — Progress Notes (Signed)
Inpatient Rehabilitation  Patient information reviewed and entered into eRehab system by Roschelle Calandra M. Cadie Sorci, M.A., CCC/SLP, PPS Coordinator.  Information including medical coding, functional ability and quality indicators will be reviewed and updated through discharge.    

## 2019-08-19 NOTE — Progress Notes (Addendum)
[place] PHYSICAL MEDICINE & REHABILITATION PROGRESS NOTE   Subjective/Complaints: Up in chair. "who are you?" "who named you?".   ROS: Limited due to cognitive/behavioral   Objective:   No results found. Recent Labs    08/19/19 0546  WBC 6.2  HGB 14.2  HCT 41.9  PLT 299   Recent Labs    08/19/19 0546  NA 133*  K 4.0  CL 96*  CO2 27  GLUCOSE 124*  BUN 22  CREATININE 0.96  CALCIUM 9.7    Intake/Output Summary (Last 24 hours) at 08/19/2019 1045 Last data filed at 08/19/2019 0800 Gross per 24 hour  Intake 120 ml  Output --  Net 120 ml     Physical Exam: Vital Signs Blood pressure 135/81, pulse 91, temperature 98.1 F (36.7 C), temperature source Oral, resp. rate 16, height 5\' 7"  (1.702 m), weight 70 kg, SpO2 100 %. Constitutional: No distress . Vital signs reviewed. HEENT: EOMI, oral membranes moist. Blind right eye, NGT Neck: supple Cardiovascular: RRR without murmur. No JVD    Respiratory/Chest: CTA Bilaterally without wheezes or rales. Normal effort    GI/Abdomen: BS +, non-tender, non-distended Ext: no clubbing, cyanosis, or edema Psych: pleasant and cooperative Musculoskeletal:  General: Normal range of motion.  Neurological: alert and follows basic commands. Oriented to name only. Speech clear Moves all 4 limbs. Senses pain.  Skin: Skin iswarmand dry.  Psychiatric: He has anormal mood and affect.    Assessment/Plan: 1. Functional deficits secondary to TBI which require 3+ hours per day of interdisciplinary therapy in a comprehensive inpatient rehab setting.  Physiatrist is providing close team supervision and 24 hour management of active medical problems listed below.  Physiatrist and rehab team continue to assess barriers to discharge/monitor patient progress toward functional and medical goals  Care Tool:  Bathing              Bathing assist       Upper Body Dressing/Undressing Upper body dressing   What is the patient  wearing?: Hospital gown only    Upper body assist Assist Level: Minimal Assistance - Patient > 75%    Lower Body Dressing/Undressing Lower body dressing      What is the patient wearing?: Incontinence brief     Lower body assist Assist for lower body dressing: Dependent - Patient 0%     Toileting Toileting    Toileting assist Assist for toileting: Dependent - Patient 0%     Transfers Chair/bed transfer  Transfers assist  Chair/bed transfer activity did not occur: Safety/medical concerns        Locomotion Ambulation   Ambulation assist              Walk 10 feet activity   Assist           Walk 50 feet activity   Assist           Walk 150 feet activity   Assist           Walk 10 feet on uneven surface  activity   Assist           Wheelchair     Assist               Wheelchair 50 feet with 2 turns activity    Assist            Wheelchair 150 feet activity     Assist          Blood pressure 135/81, pulse  91, temperature 98.1 F (36.7 C), temperature source Oral, resp. rate 16, height 5\' 7"  (1.702 m), weight 70 kg, SpO2 100 %.  Medical Problem List and Plan: 1.Functional and cognitive deficitssecondary to TBI with skull fracture -patient may shower -ELOS/Goals: 12-16 days , supervision 2. Antithrombotics: -DVT/anticoagulation:Pharmaceutical:Lovenox -antiplatelet therapy: N/a 3. Pain Management:Ultram and/or oxycodone prn.    -continue scheduled tylenol qid for now. 4. Mood:LCSW to follow for evaluation and support when appropriate. -antipsychotic agents: N/A 5. Neuropsych: This patientis notcapable of making decisions on hisown behalf. 6. Skin/Wound Care:routine pressure relief measures. 7. Fluids/Electrolytes/Nutrition:encourage PO  4/30-cortrak remains in place   -ate 10%-70%-80% yesterday. Ate 0% for breakfast   -if he  demonstrates consistent PO intake this weekend, can dc NGT Sunday or Monday   4/30-mild hyponatremia--likely central, nutritional   --improve nutrition, recheck labs Monday  8. Agitation: At risk for falls. Continue 4 points restraints for fall prevention/ to prevent pulling out cortack.    -4/30 continue on hs klonopin 0.25mg  and seroquel 25mg  bid for now   -did not require addnl medication last night  -move to enclosure bed once NGT out   -continue sleep chartsleep chart   -quite/controlled environment 9. Hyperglycemia: Likely due to tube feeds. Hgb A1C 5.2  10. History of polysubstance abuse: Continue thiamine and folic acid.  11. Dysphagia: D2 thins for now  -advance per SLP      LOS: 1 days A FACE TO [place] 08/19/2019, 10:45 AM

## 2019-08-19 NOTE — Progress Notes (Signed)
Social Work Patient ID: Jeremiah West, male   DOB: [birth year], 69 y.o.   MRN: [id number]   SW unable to complete assessment with pt due to cognition. SW will make efforts to complete assessment with family.   SW left message for pt sister Jeremiah West [phone]) requesting return phone call to complete assessment. SW spoke with pt s/o Jeremiah West ([phone]) in efforts to complete assessment and discuss discharge plan, however, she felt pt sister could better answer questions. She does report she and his sister are looking to find another housing location for him. Pt sister will be SW main contact going forward.   Cecile Sheerer, MSW, LCSWA Office: [phone] Cell: [phone] Fax: [phone]

## 2019-08-19 NOTE — Progress Notes (Signed)
Calorie Count Note: Day 1  48-hour calorie count ordered. Calorie count started on 08/18/19 at dinner meal. Calorie count will end on 08/20/19 after lunch meal.  RD will provide results of Day 2 on Monday 08/22/19.  Discussed calorie count with RN. Envelope with instructions taped to door.  Diet: Dysphagia 2, thin liquids Supplements: - Ensure Enlive po TID, each supplement provides 350 kcal and 20 grams of protein  Day 1: 4/29 Dinner: 510 kcal, 26 grams protein 4/30 Breakfast: 120 kcal, 0 grams protein 4/30 Lunch: 423 kcal, 21 grams protein Supplements: 290 kcal, 9 grams protein (1 Magic Cup with SLP)  Total 24-hour intake: 1343 kcal (67% of minimum estimated needs)  56 grams protein (56% of minimum estimated needs)  Nutrition Diagnosis: Moderate Malnutrition related to chronic illness (chronic medical issues and/or ETOH abuse) as evidenced by mild fat depletion, moderate muscle depletion.  Goal: Patient will meet greater than or equal to 90% of their needs  Intervention: - Nocturnal tube feeds via Cortrak: Pivot 1.5 @ 45 ml/hr to run for 10 hours from 1900 to 0500 - Continue 48-hour calorie count - MVI with minerals daily - Ensure Enlive po TID   Earma Reading, MS, RD, LDN Inpatient Clinical Dietitian Pager: [phone] Weekend/After Hours: [phone]
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
## 2019-08-20 ENCOUNTER — Inpatient Hospital Stay (HOSPITAL_COMMUNITY): Payer: Medicare Other

## 2019-08-20 ENCOUNTER — Inpatient Hospital Stay (HOSPITAL_COMMUNITY): Payer: Medicare Other | Admitting: Physical Therapy

## 2019-08-20 DIAGNOSIS — R739 Hyperglycemia, unspecified: Secondary | ICD-10-CM

## 2019-08-20 DIAGNOSIS — R1310 Dysphagia, unspecified: Secondary | ICD-10-CM

## 2019-08-20 DIAGNOSIS — E871 Hypo-osmolality and hyponatremia: Secondary | ICD-10-CM

## 2019-08-20 DIAGNOSIS — R4587 Impulsiveness: Secondary | ICD-10-CM

## 2019-08-20 DIAGNOSIS — G479 Sleep disorder, unspecified: Secondary | ICD-10-CM

## 2019-08-20 LAB — GLUCOSE, CAPILLARY
Glucose-Capillary: 103 mg/dL — ABNORMAL HIGH (ref 70–99)
Glucose-Capillary: 106 mg/dL — ABNORMAL HIGH (ref 70–99)
Glucose-Capillary: 120 mg/dL — ABNORMAL HIGH (ref 70–99)
Glucose-Capillary: 123 mg/dL — ABNORMAL HIGH (ref 70–99)
Glucose-Capillary: 161 mg/dL — ABNORMAL HIGH (ref 70–99)

## 2019-08-20 MED ORDER — QUETIAPINE FUMARATE 25 MG PO TABS
25.0000 mg | ORAL_TABLET | Freq: Every day | ORAL | Status: DC
  Administered 2019-08-21: 25 mg via ORAL
  Filled 2019-08-20: qty 1

## 2019-08-20 MED ORDER — NON FORMULARY: 3.0000 mg | Freq: Every day | Status: DC

## 2019-08-20 MED ORDER — QUETIAPINE FUMARATE 25 MG PO TABS
37.5000 mg | ORAL_TABLET | Freq: Every day | ORAL | Status: DC
  Administered 2019-08-20: 37.5 mg via ORAL
  Filled 2019-08-20: qty 2

## 2019-08-20 MED ORDER — MELATONIN 3 MG PO TABS
3.0000 mg | ORAL_TABLET | Freq: Every day | ORAL | Status: DC
  Administered 2019-08-20 – 2019-08-21 (×2): 3 mg via ORAL
  Filled 2019-08-20 (×2): qty 1

## 2019-08-20 NOTE — Progress Notes (Signed)
[facility] PHYSICAL MEDICINE & REHABILITATION PROGRESS NOTE   Subjective/Complaints: Patient seen sitting up at the edge of his bed, working with therapies.  Patient did not sleep well overnight per nursing and therapies.  Discussed participation with therapies, limited, requiring max assist yesterday.  ROS: Limited due to cognition  Objective:   No results found. Recent Labs    08/19/19 0546  WBC 6.2  HGB 14.2  HCT 41.9  PLT 299   Recent Labs    08/19/19 0546  NA 133*  K 4.0  CL 96*  CO2 27  GLUCOSE 124*  BUN 22  CREATININE 0.96  CALCIUM 9.7    Intake/Output Summary (Last 24 hours) at 08/20/2019 1324 Last data filed at 08/20/2019 1200 Gross per 24 hour  Intake 140 ml  Output --  Net 140 ml     Physical Exam: Vital Signs Blood pressure (!) 141/59, pulse 93, temperature 98.6 F (37 C), temperature source Oral, resp. rate 19, height 5\' 7"  (1.702 m), weight 71.5 kg, SpO2 97 %. Constitutional: No distress . Vital signs reviewed. HENT: Normocephalic.  Atraumatic.  + NG. Eyes: EOMI and left eye. No discharge. Cardiovascular: No JVD. Respiratory: Normal effort.  No stridor. GI: Non-distended. Skin: Warm and dry.  Intact. Psych: Normal mood.  Normal behavior. Musc: No edema in extremities.  No tenderness in extremities. Neuro: Alert Motor: Not following commands, but moving all extremities   Assessment/Plan: 1. Functional deficits secondary to TBI which require 3+ hours per day of interdisciplinary therapy in a comprehensive inpatient rehab setting.  Physiatrist is providing close team supervision and 24 hour management of active medical problems listed below.  Physiatrist and rehab team continue to assess barriers to discharge/monitor patient progress toward functional and medical goals  Care Tool:  Bathing    Body parts bathed by patient: Chest, Abdomen   Body parts bathed by helper: Right arm, Left arm, Front perineal area, Buttocks, Right upper leg, Left  upper leg, Right lower leg, Left lower leg, Face     Bathing assist Assist Level: Maximal Assistance - Patient 24 - 49%     Upper Body Dressing/Undressing Upper body dressing   What is the patient wearing?: Pull over shirt    Upper body assist Assist Level: Moderate Assistance - Patient 50 - 74%    Lower Body Dressing/Undressing Lower body dressing      What is the patient wearing?: Incontinence brief     Lower body assist Assist for lower body dressing: Maximal Assistance - Patient 25 - 49%     Toileting Toileting    Toileting assist Assist for toileting: Dependent - Patient 0%     Transfers Chair/bed transfer  Transfers assist  Chair/bed transfer activity did not occur: Safety/medical concerns  Chair/bed transfer assist level: Minimal Assistance - Patient > 75%     Locomotion Ambulation   Ambulation assist   Ambulation activity did not occur: Refused(pt refused gait w/o AD)          Walk 10 feet activity   Assist  Walk 10 feet activity did not occur: Refused        Walk 50 feet activity   Assist Walk 50 feet with 2 turns activity did not occur: Refused         Walk 150 feet activity   Assist           Walk 10 feet on uneven surface  activity   Assist Walk 10 feet on uneven surfaces activity did not  occur: Actor activity did not occur: N/A         Wheelchair 50 feet with 2 turns activity    Assist    Wheelchair 50 feet with 2 turns activity did not occur: N/A       Wheelchair 150 feet activity     Assist  Wheelchair 150 feet activity did not occur: N/A       Blood pressure (!) 141/59, pulse 93, temperature 98.6 F (37 C), temperature source Oral, resp. rate 19, height 5\' 7"  (1.702 m), weight 71.5 kg, SpO2 97 %.  Medical Problem List and Plan: 1.Functional and cognitive deficitssecondary to TBI with skull fracture  Continue CIR 2.  Antithrombotics: -DVT/anticoagulation:Pharmaceutical:Lovenox -antiplatelet therapy: N/a 3. Pain Management:Ultram and/or oxycodone prn.    -continue scheduled tylenol qid for now. 4. Mood:LCSW to follow for evaluation and support when appropriate. -antipsychotic agents: N/A 5. Neuropsych: This patientis notcapable of making decisions on hisown behalf.  Telesitter for safety 6. Skin/Wound Care:routine pressure relief measures. 7. Fluids/Electrolytes/Nutrition:encourage PO 8. Agitation: At risk for falls. Continue 4 points restraints for fall prevention/ to prevent pulling out cortack.    -4/30 continue on hs klonopin 0.25mg  and seroquel 25mg  AM, nightly dose increased to 37.5   quite/controlled environment 9. Hyperglycemia: Likely due to tube feeds. Hgb A1C 5.2  10. History of polysubstance abuse: Continue thiamine and folic acid.  11. Dysphagia: D2 thins for now  -advance per SLP   Continue TFs,    Good p.o. intake, plan to DC NG tomorrow if consistently eating well 12.  Sleep disturbance  Sleep chart ordered  Melatonin started on 5/1  Nightly Seroquel increased to 37.5 on 5/1 13.  Hyperglycemia-secondary to tube feeds  Continue to monitor 14.  Hyponatremia  Sodium 133 on 4/30, labs ordered for Monday  LOS: 2 days A FACE TO FACE EVALUATION WAS PERFORMED  Lamel Mccarley 5/30 08/20/2019, 1:24 PM

## 2019-08-20 NOTE — Progress Notes (Signed)
Occupational Therapy Session Note  Patient Details  Name: Jeremiah West MRN: [id number] Date of Birth: [birth year]  Today's Date: 08/20/2019 OT Individual Time: 1000-1100 OT Individual Time Calculation (min): 60 min    Short Term Goals: Week 1:  OT Short Term Goal 1 (Week 1): Pt will consistently transfer to toilet wiht MIN A OT Short Term Goal 2 (Week 1): Pt will thread BLE into pants demoing improved dressing praxis OT Short Term Goal 3 (Week 1): Pt will stand to groom with MIN A and MOD VC for sequncing OT Short Term Goal 4 (Week 1): Pt will bathe UB with MOD VC for initation  Skilled Therapeutic Interventions/Progress Updates:     1;1. Pt received in bed initially groggy. Upon waking pt up, pt pets OTs head stating, "I like that shit." Pt attempted to give OT hug and touch back of OT leg at beginning of session. Pt completes supine>sitting with S and encouragement to get to EOB. Pt completes stand pivot transfer with VC for moving feet to L and MOD A. Pt dons pants with MAX A with improved initiation to thread BLE into pants, however requires A to complete fully d/t decreased reaching. Pt completes grooming at sink with improved sequencing/initation however requires total A to terminate task. Pt sits at table top 15 min in moderately distracting environment and paints with no VC for redirection to task. Set up pt at RN station with waiste belt donned to chair and pt coloring quietly. Pt consumed 2 ensures by straw with no s/s of aspiration  Therapy Documentation Precautions:  Precautions Precautions: Fall Precaution Comments: Soft waist Posey belt restraint, B wrist restraints Restrictions Weight Bearing Restrictions: No General:   Vital Signs:  Pain: Pain Assessment Pain Scale: 0-10 Pain Score: 1  ADL: ADL Grooming: Moderate assistance Where Assessed-Grooming: Standing at sink Upper Body Bathing: Maximal assistance Where Assessed-Upper Body Bathing: Sitting at sink Lower  Body Bathing: Maximal assistance Where Assessed-Lower Body Bathing: Sitting at sink Upper Body Dressing: Moderate assistance Where Assessed-Upper Body Dressing: Sitting at sink Lower Body Dressing: Maximal assistance Where Assessed-Lower Body Dressing: Sitting at sink, Standing at sink Toilet Transfer: Minimal assistance Toilet Transfer Method: Stand pivot Toilet Transfer Equipment: Grab bars Vision   Perception    Praxis   Exercises:   Other Treatments:     Therapy/Group: Individual Therapy  Shon Hale 08/20/2019, 11:02 AM

## 2019-08-20 NOTE — Progress Notes (Signed)
Physical Therapy Session Note  Patient Details  Name: Jeremiah West MRN: [id number] Date of Birth: 1950/10/24  Today's Date: 08/20/2019 PT Individual Time: 1215-1330 PT Individual Time Calculation (min): 75 min   Short Term Goals: Week 1:  PT Short Term Goal 1 (Week 1): Pt will tolerate 60 min of upright activity w/ minimal increase in fatigue PT Short Term Goal 2 (Week 1): Pt will initiate stair training PT Short Term Goal 3 (Week 1): Pt will ambulate 44' w/ mod assist w/ LRAD PT Short Term Goal 4 (Week 1): Pt will initiate functional tasks w/ min cues 50% of the time  Skilled Therapeutic Interventions/Progress Updates:   Pt received in w/c at nurses station, agreeable to therapy and no c/o pain. Set-up to eat lunch at table in minimally-distracting environment. Verbal and tactile cues for pt to open salt/pepper packets. With min-mod verbal cues to attend to task, pt able to self-feed using BUEs appropriately w/ supervision. Pt attempting to turn w/c away from table when he was finished. He stated "I'm trying to move". Worked on ambulation w/ HHA x1 on R side. Sit>stand w/ mod assist and gait x10' w/ mod-max assist via HHA x1. After a few feet, pt stated "I need to sit down". Able to safely reach chair w/ verbal and manual cues for BUE placement. Pt w/ poor motor planning w/ RUE, needing cues to open hand and place on armrest of chair. Pt stated "I'm tired of sitting in the chair" but when asked to walk w/ therapist again, pt stated no. Performed NuStep 4 min and 3 min @ level 2 w/ all extremities to work on global strengthening/endurance and reciprocal movement pattern, mod-max cues to attend to task but pt able to maintain fluid and reciprocal pattern. Turned on music he enjoys w/ improved participation. Returned to room via w/c, pt stated he needed to toilet. Ambulated to/from toilet w/ mod-max assist using RW. Most manual assist needed for RW management and upright balance. Verbal and tactile  cues for upright posture and to keep RW closer to him. Pt refused to sit to void for safety, instead continent of void in static stance w/ mod assist for balance. Returned to w/c. Pt had been incontinent of previous void in brief. Sit>stand to RW w/ min assist, min assist to maintain stance while 2nd helper removed brief and pants. Pt threaded new pants w/ min assist, mod assist to pull over hips in standing. Pt washed hands at sink w/ supervision and visual cues for sequencing. Pt declining returning to bed. Ended session sitting in w/c at nurses station under direct supervision of RN. Posey waist belt donned and pt set-up w/ colored pencils and coloring pages, per his request. Pt appears to enjoy task and it is calming for him as he demonstrates decreased impulsive and restless behaviors.  Therapy Documentation Precautions:  Precautions Precautions: Fall Precaution Comments: Soft waist Posey belt restraint, B wrist restraints Restrictions Weight Bearing Restrictions: No  Therapy/Group: Individual Therapy  Ileana Chalupa K Edwena Mayorga 08/20/2019, 1:32 PM

## 2019-08-20 NOTE — Progress Notes (Signed)
Occupational Therapy Session Note  Patient Details  Name: Jeremiah West MRN: [id number] Date of Birth: 10/12/1950  Today's Date: 08/20/2019 OT Individual Time: 0700-0800 OT Individual Time Calculation (min): 60 min    Short Term Goals: Week 1:  OT Short Term Goal 1 (Week 1): Pt will consistently transfer to toilet wiht MIN A OT Short Term Goal 2 (Week 1): Pt will thread BLE into pants demoing improved dressing praxis OT Short Term Goal 3 (Week 1): Pt will stand to groom with MIN A and MOD VC for sequncing OT Short Term Goal 4 (Week 1): Pt will bathe UB with MOD VC for initation  Skilled Therapeutic Interventions/Progress Updates:    1:1. Pt calling for RN when OT entering room. Pt able to supine>sitting EOB with CGA and no VC except for initiation. Pt very tactily seeking fidgeting with phone, chuckpad (trying to rip it) and tape (given by OT to keep pt from trying to rip NG. Pt unable to consistently follow commands d/t lack of motivation for mobility. Declining OOB/trasnfer to recliner/w/c despite multiple attempts. Pt sit to stand 4x at EOB for OT and NT to change brief/chuck/sheets d/t being soaked in urine/wash peri are. Exited session with pt supine in bed, exi talarm on and call light tin reach. Waiste belt donned NT aware of positiong.  Therapy Documentation Precautions:  Precautions Precautions: Fall Precaution Comments: Soft waist Posey belt restraint, B wrist restraints Restrictions Weight Bearing Restrictions: No General:   Vital Signs: Therapy Vitals Temp: 98.6 F (37 C) Temp Source: Oral Pulse Rate: 93 Resp: 19 BP: (!) 141/59 Patient Position (if appropriate): Lying Oxygen Therapy SpO2: 97 % O2 Device: Room Air Pain: Pain Assessment Pain Scale: 0-10 Pain Score: Asleep Faces Pain Scale: No hurt Pain Location: Head Pain Orientation: Mid Pain Radiating Towards: (neck) Pain Descriptors / Indicators: Headache Pain Intervention(s): Medication (See  eMAR) PAINAD (Pain Assessment in Advanced Dementia) Breathing: normal Negative Vocalization: none Facial Expression: smiling or inexpressive Body Language: relaxed Consolability: no need to console PAINAD Score: 0 ADL: ADL Grooming: Moderate assistance Where Assessed-Grooming: Standing at sink Upper Body Bathing: Maximal assistance Where Assessed-Upper Body Bathing: Sitting at sink Lower Body Bathing: Maximal assistance Where Assessed-Lower Body Bathing: Sitting at sink Upper Body Dressing: Moderate assistance Where Assessed-Upper Body Dressing: Sitting at sink Lower Body Dressing: Maximal assistance Where Assessed-Lower Body Dressing: Sitting at sink, Standing at sink Toilet Transfer: Minimal assistance Toilet Transfer Method: Stand pivot Toilet Transfer Equipment: Grab bars Vision   Perception    Praxis   Exercises:   Other Treatments:     Therapy/Group: Individual Therapy  Shon Hale 08/20/2019, 7:01 AM

## 2019-08-20 NOTE — Plan of Care (Signed)
  Problem: Consults Goal: RH BRAIN INJURY PATIENT EDUCATION Description: Description: See Patient Education module for eduction specifics Outcome: Progressing Goal: Nutrition Consult-if indicated Outcome: Progressing Goal: Diabetes Guidelines if Diabetic/Glucose > 140 Description: If diabetic or lab glucose is > 140 mg/dl - Initiate Diabetes/Hyperglycemia Guidelines & Document Interventions  Outcome: Progressing   Problem: RH BOWEL ELIMINATION Goal: RH STG MANAGE BOWEL WITH ASSISTANCE Description: STG Manage Bowel with min Assistance. Outcome: Progressing   Problem: RH BLADDER ELIMINATION Goal: RH STG MANAGE BLADDER WITH ASSISTANCE Description: STG Manage Bladder With min Assistance Outcome: Progressing   Problem: RH SAFETY Goal: RH STG ADHERE TO SAFETY PRECAUTIONS W/ASSISTANCE/DEVICE Description: STG Adhere to Safety Precautions With min/mod Assistance/Device. Outcome: Progressing   Problem: RH COGNITION-NURSING Goal: RH STG USES MEMORY AIDS/STRATEGIES W/ASSIST TO PROBLEM SOLVE Description: STG Uses Memory Aids/Strategies With min/mod Assistance to Problem Solve. Outcome: Progressing Goal: RH STG ANTICIPATES NEEDS/CALLS FOR ASSIST W/ASSIST/CUES Description: STG Anticipates Needs/Calls for Assist With min/mod Assistance/Cues. Outcome: Progressing   Problem: RH KNOWLEDGE DEFICIT BRAIN INJURY Goal: RH STG INCREASE KNOWLEDGE OF SELF CARE AFTER BRAIN INJURY Description: With min/mod assist Outcome: Progressing Goal: RH STG INCREASE KNOWLEDGE OF DYSPHAGIA/FLUID INTAKE Description: With min/mod assist Outcome: Progressing   

## 2019-08-21 DIAGNOSIS — R451 Restlessness and agitation: Secondary | ICD-10-CM

## 2019-08-21 LAB — GLUCOSE, CAPILLARY
Glucose-Capillary: 108 mg/dL — ABNORMAL HIGH (ref 70–99)
Glucose-Capillary: 125 mg/dL — ABNORMAL HIGH (ref 70–99)
Glucose-Capillary: 130 mg/dL — ABNORMAL HIGH (ref 70–99)
Glucose-Capillary: 142 mg/dL — ABNORMAL HIGH (ref 70–99)
Glucose-Capillary: 82 mg/dL (ref 70–99)

## 2019-08-21 MED ORDER — QUETIAPINE FUMARATE 50 MG PO TABS
50.0000 mg | ORAL_TABLET | Freq: Every day | ORAL | Status: DC
  Administered 2019-08-21 – 2019-08-31 (×11): 50 mg via ORAL
  Filled 2019-08-21 (×11): qty 1

## 2019-08-21 MED ORDER — QUETIAPINE FUMARATE 50 MG PO TABS: 50.0000 mg | ORAL_TABLET | Freq: Every day | ORAL | Status: DC

## 2019-08-21 MED ORDER — PROPRANOLOL HCL 20 MG PO TABS
20.0000 mg | ORAL_TABLET | Freq: Three times a day (TID) | ORAL | Status: DC
  Administered 2019-08-21 – 2019-09-23 (×67): 20 mg via ORAL
  Filled 2019-08-21 (×89): qty 1

## 2019-08-21 MED ORDER — QUETIAPINE FUMARATE 25 MG PO TABS
25.0000 mg | ORAL_TABLET | Freq: Every day | ORAL | Status: DC
  Administered 2019-08-22 – 2019-08-26 (×5): 25 mg via ORAL
  Filled 2019-08-21 (×5): qty 1

## 2019-08-21 NOTE — Progress Notes (Signed)
Pt gf called and asked for medical updates about pt . This writer told gf that she not on the contact information. Informed pt sister , that gf was calling asking for this information. Pt sister states : only medical information needs to be given to me at this time."

## 2019-08-21 NOTE — Progress Notes (Signed)
[place] PHYSICAL MEDICINE & REHABILITATION PROGRESS NOTE   Subjective/Complaints: Patient seen laying in bed this AM.  Nurse tech at bedside, who states patient slept well until 3AM after which he became agitated.    ROS: Limited due to cognition.  Objective:   No results found. Recent Labs    08/19/19 0546  WBC 6.2  HGB 14.2  HCT 41.9  PLT 299   Recent Labs    08/19/19 0546  NA 133*  K 4.0  CL 96*  CO2 27  GLUCOSE 124*  BUN 22  CREATININE 0.96  CALCIUM 9.7    Intake/Output Summary (Last 24 hours) at 08/21/2019 1441 Last data filed at 08/21/2019 1247 Gross per 24 hour  Intake 180 ml  Output -  Net 180 ml     Physical Exam: Vital Signs Blood pressure 123/79, pulse 73, temperature 98.5 F (36.9 C), temperature source Oral, resp. rate 18, height 5\' 7"  (1.702 m), weight 71.4 kg, SpO2 97 %.  Constitutional: No distress . Vital signs reviewed. HENT: Normocephalic.  Atraumatic. +NG.  Eyes: EOMI. No discharge. Cardiovascular: No JVD. Respiratory: Normal effort.  No stridor. GI: Non-distended. Skin: Warm and dry.  Intact. Psych: Normal mood.  Normal behavior. Musc: No edema in extremities.  No tenderness in extremities. Neuro: Alert Motor: Not following commands, but moving all extremities   Assessment/Plan: 1. Functional deficits secondary to TBI which require 3+ hours per day of interdisciplinary therapy in a comprehensive inpatient rehab setting.  Physiatrist is providing close team supervision and 24 hour management of active medical problems listed below.  Physiatrist and rehab team continue to assess barriers to discharge/monitor patient progress toward functional and medical goals  Care Tool:  Bathing    Body parts bathed by patient: Chest, Abdomen   Body parts bathed by helper: Right arm, Left arm, Front perineal area, Buttocks, Right upper leg, Left upper leg, Right lower leg, Left lower leg, Face     Bathing assist Assist Level: Maximal  Assistance - Patient 24 - 49%     Upper Body Dressing/Undressing Upper body dressing   What is the patient wearing?: Pull over shirt    Upper body assist Assist Level: Moderate Assistance - Patient 50 - 74%    Lower Body Dressing/Undressing Lower body dressing      What is the patient wearing?: Incontinence brief     Lower body assist Assist for lower body dressing: Maximal Assistance - Patient 25 - 49%     Toileting Toileting    Toileting assist Assist for toileting: Dependent - Patient 0%     Transfers Chair/bed transfer  Transfers assist  Chair/bed transfer activity did not occur: Safety/medical concerns  Chair/bed transfer assist level: Moderate Assistance - Patient 50 - 74%     Locomotion Ambulation   Ambulation assist   Ambulation activity did not occur: Refused(pt refused gait w/o AD)  Assist level: Maximal Assistance - Patient 25 - 49% Assistive device: Hand held assist Max distance: 10'   Walk 10 feet activity   Assist  Walk 10 feet activity did not occur: Refused  Assist level: Maximal Assistance - Patient 25 - 49% Assistive device: Hand held assist   Walk 50 feet activity   Assist Walk 50 feet with 2 turns activity did not occur: Refused         Walk 150 feet activity   Assist           Walk 10 feet on uneven surface  activity  Assist Walk 10 feet on uneven surfaces activity did not occur: Armed forces operational officer activity did not occur: N/A         Wheelchair 50 feet with 2 turns activity    Assist    Wheelchair 50 feet with 2 turns activity did not occur: N/A       Wheelchair 150 feet activity     Assist  Wheelchair 150 feet activity did not occur: N/A       Blood pressure 123/79, pulse 73, temperature 98.5 F (36.9 C), temperature source Oral, resp. rate 18, height 5\' 7"  (1.702 m), weight 71.4 kg, SpO2 97 %.  Medical Problem List and  Plan: 1.Functional and cognitive deficitssecondary to TBI with skull fracture  Continue CIR 2. Antithrombotics: -DVT/anticoagulation:Pharmaceutical:Lovenox -antiplatelet therapy: N/a 3. Pain Management:Ultram and/or oxycodone prn.    -continue scheduled tylenol qid for now. 4. Mood:LCSW to follow for evaluation and support when appropriate. -antipsychotic agents: N/A 5. Neuropsych: This patientis notcapable of making decisions on hisown behalf.  Telesitter for safety, also require sitter 6. Skin/Wound Care:routine pressure relief measures. 7. Fluids/Electrolytes/Nutrition:encourage PO 8. Agitation: At risk for falls.    -4/30 continue on hs klonopin 0.25mg  and seroquel 25mg  AM, nightly dose increased to 37.5   quite/controlled environment  Propranolol 20 TID started on 5/2  Restraints for safety if necessary, will transition to canopy bed 9. Hyperglycemia: Likely due to tube feeds. Hgb A1C 5.2  10. History of polysubstance abuse: Continue thiamine and folic acid.  11. Dysphagia: D2 thins for now  Advance per SLP   Eating well, will d/c TFs  12.  Sleep disturbance  Sleep chart ordered  Melatonin started on 5/1  Nightly Seroquel increased to 37.5 on 5/1, increased to 50 on 5/2 13.  Hyperglycemia-secondary to tube feeds  Relatively controlled on 5/2  Continue to monitor 14.  Hyponatremia  Sodium 133 on 4/30, labs ordered for tomorrow  LOS: 3 days A FACE TO FACE EVALUATION WAS PERFORMED  Jeremiah West 08/21/2019, 2:41 PM

## 2019-08-21 NOTE — Plan of Care (Signed)
  Problem: Consults Goal: RH BRAIN INJURY PATIENT EDUCATION Description: Description: See Patient Education module for eduction specifics Outcome: Progressing Goal: Nutrition Consult-if indicated Outcome: Progressing Goal: Diabetes Guidelines if Diabetic/Glucose > 140 Description: If diabetic or lab glucose is > 140 mg/dl - Initiate Diabetes/Hyperglycemia Guidelines & Document Interventions  Outcome: Progressing   Problem: RH BOWEL ELIMINATION Goal: RH STG MANAGE BOWEL WITH ASSISTANCE Description: STG Manage Bowel with min Assistance. Outcome: Progressing   Problem: RH BLADDER ELIMINATION Goal: RH STG MANAGE BLADDER WITH ASSISTANCE Description: STG Manage Bladder With min Assistance Outcome: Progressing   Problem: RH SAFETY Goal: RH STG ADHERE TO SAFETY PRECAUTIONS W/ASSISTANCE/DEVICE Description: STG Adhere to Safety Precautions With min/mod Assistance/Device. Outcome: Progressing   Problem: RH COGNITION-NURSING Goal: RH STG USES MEMORY AIDS/STRATEGIES W/ASSIST TO PROBLEM SOLVE Description: STG Uses Memory Aids/Strategies With min/mod Assistance to Problem Solve. Outcome: Progressing Goal: RH STG ANTICIPATES NEEDS/CALLS FOR ASSIST W/ASSIST/CUES Description: STG Anticipates Needs/Calls for Assist With min/mod Assistance/Cues. Outcome: Progressing   Problem: RH KNOWLEDGE DEFICIT BRAIN INJURY Goal: RH STG INCREASE KNOWLEDGE OF SELF CARE AFTER BRAIN INJURY Description: With min/mod assist Outcome: Progressing Goal: RH STG INCREASE KNOWLEDGE OF DYSPHAGIA/FLUID INTAKE Description: With min/mod assist Outcome: Progressing   

## 2019-08-21 NOTE — Plan of Care (Signed)
  Problem: Consults Goal: RH BRAIN INJURY PATIENT EDUCATION Description: Description: See Patient Education module for eduction specifics Outcome: Progressing Goal: Nutrition Consult-if indicated Outcome: Progressing Goal: Diabetes Guidelines if Diabetic/Glucose > 140 Description: If diabetic or lab glucose is > 140 mg/dl - Initiate Diabetes/Hyperglycemia Guidelines & Document Interventions  Outcome: Progressing   Problem: RH BOWEL ELIMINATION Goal: RH STG MANAGE BOWEL WITH ASSISTANCE Description: STG Manage Bowel with min Assistance. Outcome: Progressing   Problem: RH BLADDER ELIMINATION Goal: RH STG MANAGE BLADDER WITH ASSISTANCE Description: STG Manage Bladder With min Assistance Outcome: Progressing   Problem: RH KNOWLEDGE DEFICIT BRAIN INJURY Goal: RH STG INCREASE KNOWLEDGE OF SELF CARE AFTER BRAIN INJURY Description: With min/mod assist Outcome: Progressing Goal: RH STG INCREASE KNOWLEDGE OF DYSPHAGIA/FLUID INTAKE Description: With min/mod assist Outcome: Progressing

## 2019-08-22 ENCOUNTER — Inpatient Hospital Stay (HOSPITAL_COMMUNITY): Payer: Medicare Other | Admitting: Speech Pathology

## 2019-08-22 ENCOUNTER — Inpatient Hospital Stay (HOSPITAL_COMMUNITY): Payer: Medicare Other

## 2019-08-22 ENCOUNTER — Inpatient Hospital Stay (HOSPITAL_COMMUNITY): Payer: Medicare Other | Admitting: Physical Therapy

## 2019-08-22 LAB — GLUCOSE, CAPILLARY
Glucose-Capillary: 101 mg/dL — ABNORMAL HIGH (ref 70–99)
Glucose-Capillary: 116 mg/dL — ABNORMAL HIGH (ref 70–99)
Glucose-Capillary: 145 mg/dL — ABNORMAL HIGH (ref 70–99)
Glucose-Capillary: 93 mg/dL (ref 70–99)

## 2019-08-22 LAB — BASIC METABOLIC PANEL
Anion gap: 9 (ref 5–15)
BUN: 19 mg/dL (ref 8–23)
CO2: 26 mmol/L (ref 22–32)
Calcium: 9.8 mg/dL (ref 8.9–10.3)
Chloride: 101 mmol/L (ref 98–111)
Creatinine, Ser: 1.12 mg/dL (ref 0.61–1.24)
GFR calc Af Amer: >60 mL/min (ref >60)
GFR calc non Af Amer: >60 mL/min (ref >60)
Glucose, Bld: 113 mg/dL — ABNORMAL HIGH (ref 70–99)
Potassium: 3.9 mmol/L (ref 3.5–5.1)
Sodium: 136 mmol/L (ref 135–145)

## 2019-08-22 LAB — CBC
HCT: 38.8 % — ABNORMAL LOW (ref 39.0–52.0)
Hemoglobin: 13.2 g/dL (ref 13.0–17.0)
MCH: 33.6 pg (ref 26.0–34.0)
MCHC: 34 g/dL (ref 30.0–36.0)
MCV: 98.7 fL (ref 80.0–100.0)
Platelets: 326 10*3/uL (ref 150–400)
RBC: 3.93 MIL/uL — ABNORMAL LOW (ref 4.22–5.81)
RDW: 11.7 % (ref 11.5–15.5)
WBC: 3.7 10*3/uL — ABNORMAL LOW (ref 4.0–10.5)
nRBC: 0 % (ref 0.0–0.2)

## 2019-08-22 NOTE — Plan of Care (Addendum)
Behavioral Plan   [place] Level: VI  Behavior to decrease/ eliminate:  -restlessness - especially in late afternoon, early evening  -confusion   -refusal to participation in therapy   Changes to environment:  -telesitter  -quick release belt when in w/c, pt UNSAFE to be up in chair alone, needs to be at nurses station -avoid TV being on  -work on OOB during morning/afternoon for sleep/wake cycle, stay up in chair or recliner as long as possible during day   Interventions: -OOB for meals  -timed toileting to toilet, every 3 hours (see chart in room) -nursing staff - always go with 2 people into room -schedule therapies in afternoon   Recommendations for interactions with patient: -do not give options, tell him what you want him to do -give concrete verbal and visual directions, "stand up and sit right here" -give extra time to initiate   -pt enjoys reggae music and using adult coloring book   Attendees:  Feliberto Gottron, SLP Carlynn Purl, PT Ardis Rowan, OT Kennyth Arnold, RN Margot Ables, LPN
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
## 2019-08-22 NOTE — Care Management (Signed)
[facility]patient [facility] Individual Statement of Services  Patient Name:  Jeremiah West  Date:  08/22/2019  Welcome to the [facility]patient [facility].  Our goal is to provide you with an individualized program based on your diagnosis and situation, designed to meet your specific needs.  With this comprehensive rehabilitation program, you will be expected to participate in at least 3 hours of rehabilitation therapies Monday-Friday, with modified therapy programming on the weekends.  Your rehabilitation program will include the following services:  Physical Therapy (PT), Occupational Therapy (OT), Speech Therapy (ST), 24 hour per day rehabilitation nursing, Therapeutic Recreaction (TR), Psychology, Neuropsychology, Case Management (Social Worker), Rehabilitation Medicine, Nutrition Services, Pharmacy Services and Other  Weekly team conferences will be held on Tuesdays to discuss your progress.  Your Social Worker will talk with you frequently to get your input and to update you on team discussions.  Team conferences with you and your family in attendance may also be held.  Expected length of stay: 3 weeks    Overall anticipated outcome: Supervision  Depending on your progress and recovery, your program may change. Your Social Worker will coordinate services and will keep you informed of any changes. Your Social Worker's name and contact numbers are listed  below.  The following services may also be recommended but are not provided by the [facility]patient [facility]:   Driving Evaluations  Home Health Rehabiltiation Services  Outpatient Rehabilitation Services  Vocational Rehabilitation   Arrangements will be made to provide these services after discharge if needed.  Arrangements include referral to agencies that provide these services.  Your insurance has been verified to be:  [facility] Medicare  Your primary doctor is:  No PCP  Pertinent information will be shared with  your doctor and your insurance company.  Social Worker:  Cecile Sheerer, SW [phone] or [phone]  Information discussed with and copy given to patient by: Gretchen Short, 08/22/2019, 3:59 PM

## 2019-08-22 NOTE — Progress Notes (Signed)
Physical Therapy Session Note  Patient Details  Name: Jeremiah West MRN: [id number] Date of Birth: [birth year]  Today's Date: 08/22/2019 PT Individual Time: 1255-1404 PT Individual Time Calculation (min): 69 min   Short Term Goals: Week 1:  PT Short Term Goal 1 (Week 1): Pt will tolerate 60 min of upright activity w/ minimal increase in fatigue PT Short Term Goal 2 (Week 1): Pt will initiate stair training PT Short Term Goal 3 (Week 1): Pt will ambulate 87' w/ mod assist w/ LRAD PT Short Term Goal 4 (Week 1): Pt will initiate functional tasks w/ min cues 50% of the time  Skilled Therapeutic Interventions/Progress Updates:   Session focused on general OOB/activity tolerance and initiation, attention, and sequencing with functional tasks.  Pt in supine, agreeable to get up w/ therapy w/ encouragement, no c/o pain throughout session. Supervision supine to sit EOB w/ significantly increased time. Pt perseverating on finding his "pipe" and "ash tray". Max cues to orient to hospital environment and that he did not have a pipe or ash tray here. Once seated EOB, therapist set-up lunch tray, however pt unable to attend to task of holding himself up in static sitting at EOB and eat lunch. Transferred to w/c w/ min assist via stand pivot, max verbal and tactile cues to initiate and sequence movement. Set-up w/ just lunch plate and drink in front of him and pt able to self-feed w/ min cues for initiation and increased time. Pt continued to perseverate on finding his items in the room, he repeatedly stated "who did something with my shit?" Therapist reminded him that only him, his sister, and staff have been here and that he has no personal belongings with him. Pt intermittently closing his eyes and bringing his head down to table, he would not respond to therapist's questions regarding if he was tired or done with his lunch other than a grunt. Attempted to engage pt in conversation about his PLOF, activities,  and hobbies. In general, no response other than "mmhmm". Once pt finished w/ lunch, total assist w/c transport to/from therapy gym. Positioned w/c 15' from table and chair, instructed pt to walk to the chair. Sit>stand w/ mod assist and ambulated 15' w/ max assist via HHA x1. Manual assist for lateral weight shifting, tactile cues for upright posture, and manual assist at hips to turn and sit in chair. Once seated, attempted to engage pt in music he enjoys - reggae. Also attempted to engage pt in coloring activity while seated at table. Multiple times, pt performed sit>stand w/o assist from therapist at table. Therapist asked "where are you going?" or "do you need anything?" Pt stated he wasn't going anywhere and refused to sit back down, although he would also state he was sitting and didn't need to sit down. Total assist to safely sit in chair, he did this multiple times despite cues to let therapist know if he needed assistance and where he was trying to go. Pt very resistant to any hands-on assistance/care or any therapeutic activity during 2nd half of session. Mod assist to transfer to w/c once pt was given the option of returning to bed. Returned to room and performed stand pivot at EOB w/ mod assist, manual and verbal cues for technique and sequencing w/ turning to sit vs diving head-first into bed. Ended session seated EOB and under direct supervision of RN who was providing medication, all needs met.   Therapy Documentation Precautions:  Precautions Precautions: Fall Precaution Comments: Soft waist  Posey belt restraint, B wrist restraints Restrictions Weight Bearing Restrictions: No  Therapy/Group: Individual Therapy  Jeremiah West Jeremiah West Jeremiah West 08/22/2019, 2:05 PM

## 2019-08-22 NOTE — Progress Notes (Addendum)
Social Work Patient ID: Jeremiah West, male   DOB: 07-20-1950, 69 y.o.   MRN: [id number]   SW spoke with pt sister [name] to discuss plan of care for pt. She reports she is unable to provide care to pt due to her working and is recommending SNF placement. SW discussed if pt could have support post discharge and if she and his girlfriend would be working towards finding him a place. She states the girlfriend has explained she is not able to handle his care needs. SW explained the difference between long term care and short term rehab. She states will need to pursue long term care. SW explained [place] [employer].  She states there is no other family in this area. Reports of pt having three adult children [place], [place], [place]), however this is a strained relationship as he has not seen the children in over 30 years. She states has been trying to get in contact with them to give them updates. SW encouraged her to continue to make efforts on follow-up with children. She also reports their brother is coming to visit within the next couple of days, and she intends to ask him to support if possible. SW discussed SNF placement process. SW informed will leave SNF county list in room (Medicare.gov and [facility] list for [facility], and [facility]). SW will follow-up with updates after team conference.   SW spoke with [name] (p:[phone]) to schedule pt New patient appointment/jospital follow-up on Wednesday, May 26 at 9:30am with [name], [name].  Cecile Sheerer, MSW, LCSWA Office: [phone] Cell: [phone] Fax: [phone]

## 2019-08-22 NOTE — Progress Notes (Signed)
Calorie Count Note: Day 2  48-hour calorie count ordered. Calorie count started on 08/18/19 at dinner meal. Calorie count ended on 08/20/19 after lunch meal.  Diet: Dysphagia 2, thin liquids Supplements: - Ensure Enlive po TID, each supplement provides 350 kcal and 20 grams of protein  Day 2: 4/30 Dinner: 176 kcal, 8 grams protein 5/01 Breakfast: 37 kcal, 2 grams protein 5/01 Lunch: 561 kcal, 28 grams protein Supplements: 1400 kcal, 80 grams protein (4 Ensure Enlive per [facility])  Total 24-hour intake: 2174 kcal (100% of minimum estimated needs)  118 grams protein (>100% of minimum estimated needs)  Nutrition Diagnosis: Moderate Malnutrition related to chronic illness (chronic medical issues and/or ETOH abuse) as evidenced by mild fat depletion, moderate muscle depletion.  Goal: Patient will meet greater than or equal to 90% of their needs  Intervention: - d/c TF orders - d/c Calorie count - MVI with minerals daily -Ensure Enlive poTID   Earma Reading, MS, RD, LDN Inpatient Clinical Dietitian Pager: [phone] Weekend/After Hours: [phone]

## 2019-08-22 NOTE — Progress Notes (Signed)
Speech Language Pathology Daily Session Note  Patient Details  Name: Jeremiah West MRN: [id number] Date of Birth: 1950/07/02  Today's Date: 08/22/2019 SLP Individual Time: 1045-1140 SLP Individual Time Calculation (min): 55 min  Short Term Goals: Week 1: SLP Short Term Goal 1 (Week 1): Patient will demonstrate sustained attention to functional tasks for 5 minutes with Mod verbal cues for redirection. SLP Short Term Goal 2 (Week 1): Patient will initiate functional tasks in 50% of opportunities with Mod verbal cues. SLP Short Term Goal 3 (Week 1): Patient will demonstrate orientation X 4 with Max A multimodal cues. SLP Short Term Goal 4 (Week 1): Patient will consume current diet with minimal overt s/s of aspiration with Mod verbal cues use of swallowing compensatory strategies. SLP Short Term Goal 5 (Week 1): Patient will verbalize basic wants/needs at the phrase level with Mod verbal cues.  Skilled Therapeutic Interventions: Skilled treatment session focused on cognitive goals. Upon arrival, patient was awake while sitting up in the enclosure bed. Patient agreeable to participate in treatment session and followed commands for transfer to the recliner with overall supervision level verbal cues. SLP facilitated session by providing Min verbal cues for sorting change and Max-total A multimodal cues for problem solving while making specific amounts of change. Patient continues to demonstrate neologisms and phonemic paraphasias at the sentence level that appear to be related to deficits in language vs language of confusion, however, it remains difficult to differentiate due to cognitive impairments. SLP introduced a naming task in which he was able to name functional items with 60% accuracy. Patient also reports he can't read of write at baseline and patient was unable to write or copy his name, however, family not present to confirm. Patient was transferred back to enclosure bed at end of session and  left with all needs within reach. Continue with current plan of care.     Pain No/Denies Pain   Therapy/Group: Individual Therapy  Tawney Vanorman 08/22/2019, 2:44 PM

## 2019-08-22 NOTE — Progress Notes (Signed)
Occupational Therapy Session Note  Patient Details  Name: Jeremiah West MRN: [id number] Date of Birth: 1951-02-11  Today's Date: 08/22/2019 OT Individual Time: 5638-9373 OT Individual Time Calculation (min): 70 min    Short Term Goals: Week 1:  OT Short Term Goal 1 (Week 1): Pt will consistently transfer to toilet wiht MIN A OT Short Term Goal 2 (Week 1): Pt will thread BLE into pants demoing improved dressing praxis OT Short Term Goal 3 (Week 1): Pt will stand to groom with MIN A and MOD VC for sequncing OT Short Term Goal 4 (Week 1): Pt will bathe UB with MOD VC for initation  Skilled Therapeutic Interventions/Progress Updates:    Upon arrival in room pt observed in quadriped in enclosure bed.  Pt urinating but on questioning denied need to wash and change clothing.  Pt agreed, following max encouragement and extra time, to sitting EOB and transferring to w/c to wash up and change clothing. Pt commented several times that he had already washed up and he didn't need to change clothing.  Pt engaged in bathing when presented with wash cloth.  Pt initially perseverated on washing his face but eventually bathed other body parts with min verbal cues. Pt eventually agreeable to changing clothing.  See Care Tool for assist levels.  Initial squat pivot transfer to w/c with min A.  Sit<>stand at sink with CGA. Transfer back to bed with CGA.  Pt required max verbal cues and extra time to return to supine in bed.  Pt required max verbal cues for orientation.  Pt easily distracted by trash on floor.  Pt also fixated on calling his "old lady" but easily redirected. Pt remained in enclosure bed with call bell within reach.   Therapy Documentation Precautions:  Precautions Precautions: Fall Precaution Comments: enclosure bed Restrictions Weight Bearing Restrictions: No  Pain:  Pt denies pain this morning   Therapy/Group: Individual Therapy  Rich Brave 08/22/2019, 9:22 AM

## 2019-08-22 NOTE — Progress Notes (Signed)
[place] PHYSICAL MEDICINE & REHABILITATION PROGRESS NOTE   Subjective/Complaints: Pt lying quietly in enclosure bed  ROS: Limited due to cognitive/behavioral   Objective:   No results found. Recent Labs    08/22/19 0552  WBC 3.7*  HGB 13.2  HCT 38.8*  PLT 326   Recent Labs    08/22/19 0552  NA 136  K 3.9  CL 101  CO2 26  GLUCOSE 113*  BUN 19  CREATININE 1.12  CALCIUM 9.8    Intake/Output Summary (Last 24 hours) at 08/22/2019 1115 Last data filed at 08/22/2019 0700 Gross per 24 hour  Intake 540 ml  Output 100 ml  Net 440 ml     Physical Exam: Vital Signs Blood pressure 135/90, pulse 69, temperature 98.5 F (36.9 C), temperature source Oral, resp. rate 19, height 5\' 7"  (1.702 m), weight 71.4 kg, SpO2 (!) 89 %.  Constitutional: No distress . Vital signs reviewed. HEENT: EOMI, oral membranes moist Neck: supple Cardiovascular: RRR without murmur. No JVD    Respiratory/Chest: CTA Bilaterally without wheezes or rales. Normal effort    GI/Abdomen: BS +, non-tender, non-distended Ext: no clubbing, cyanosis, or edema Psych: restless, distracted Skin: Warm and dry.  Intact. Psych: Normal mood.  Normal behavior. Musc: No edema in extremities.  No tenderness in extremities. Neuro: Alert Motor: moves all 4's   Assessment/Plan: 1. Functional deficits secondary to TBI which require 3+ hours per day of interdisciplinary therapy in a comprehensive inpatient rehab setting.  Physiatrist is providing close team supervision and 24 hour management of active medical problems listed below.  Physiatrist and rehab team continue to assess barriers to discharge/monitor patient progress toward functional and medical goals  Care Tool:  Bathing    Body parts bathed by patient: Right arm, Left arm, Chest, Abdomen, Right upper leg, Left upper leg, Front perineal area, Face   Body parts bathed by helper: Buttocks, Right lower leg, Left lower leg     Bathing assist Assist Level:  Moderate Assistance - Patient 50 - 74%     Upper Body Dressing/Undressing Upper body dressing   What is the patient wearing?: Pull over shirt    Upper body assist Assist Level: Supervision/Verbal cueing    Lower Body Dressing/Undressing Lower body dressing      What is the patient wearing?: Incontinence brief, Pants     Lower body assist Assist for lower body dressing: Moderate Assistance - Patient 50 - 74%     Toileting Toileting    Toileting assist Assist for toileting: Dependent - Patient 0%     Transfers Chair/bed transfer  Transfers assist  Chair/bed transfer activity did not occur: Safety/medical concerns  Chair/bed transfer assist level: Moderate Assistance - Patient 50 - 74%     Locomotion Ambulation   Ambulation assist   Ambulation activity did not occur: Refused(pt refused gait w/o AD)  Assist level: Maximal Assistance - Patient 25 - 49% Assistive device: Hand held assist Max distance: 10'   Walk 10 feet activity   Assist  Walk 10 feet activity did not occur: Refused  Assist level: Maximal Assistance - Patient 25 - 49% Assistive device: Hand held assist   Walk 50 feet activity   Assist Walk 50 feet with 2 turns activity did not occur: Refused         Walk 150 feet activity   Assist           Walk 10 feet on uneven surface  activity   Assist Walk 10 feet  on uneven surfaces activity did not occur: Armed forces operational officer activity did not occur: N/A         Wheelchair 50 feet with 2 turns activity    Assist    Wheelchair 50 feet with 2 turns activity did not occur: N/A       Wheelchair 150 feet activity     Assist  Wheelchair 150 feet activity did not occur: N/A       Blood pressure 135/90, pulse 69, temperature 98.5 F (36.9 C), temperature source Oral, resp. rate 19, height 5\' 7"  (1.702 m), weight 71.4 kg, SpO2 (!) 89 %.  Medical Problem List and  Plan: 1.Functional and cognitive deficitssecondary to TBI with skull fracture  Continue CIR 2. Antithrombotics: -DVT/anticoagulation:Pharmaceutical:Lovenox -antiplatelet therapy: N/a 3. Pain Management:Ultram and/or oxycodone prn.    -continue scheduled tylenol qid for now. 4. Mood:LCSW to follow for evaluation and support when appropriate. -antipsychotic agents: N/A 5. Neuropsych: This patientis notcapable of making decisions on hisown behalf.  Requires enclosure bed  RLAS V with agitation 6. Skin/Wound Care:routine pressure relief measures. 7. Fluids/Electrolytes/Nutrition:encourage PO 8. Agitation: At risk for falls.    - continue on hs klonopin 0.25mg  and seroquel 25mg  AM, nightly dose increased to 37.5   quite/controlled environment   -continue propranolol  -sleep chart 9. Hyperglycemia: Likely due to tube feeds. Hgb A1C 5.2  10. History of polysubstance abuse: Continue thiamine and folic acid.  11. Dysphagia: D2 thins for now  Eating 100%. Off tF  -labs acceptable today  13.  Hyperglycemia-secondary to tube feeds  Sugars normal. Dc cbg checks 14.  Hyponatremia  Sodium 133 on 4/30, up to 136 5/3  LOS: 4 days A FACE TO [place] 08/22/2019, 11:15 AM

## 2019-08-22 NOTE — Progress Notes (Signed)
Pt was observed with eyes closed in bed from 10pm-1am. Pt has even and unlabored RR and no signs and symptoms of distress. PT observed in bed with eyes open agitated and confused from 1am to 6:30am, no signs and symptoms of pain or distressed. Pt vitals are WNL. Pt has been screaming at everyone who walks by and bangs the call light against the bed.

## 2019-08-23 ENCOUNTER — Inpatient Hospital Stay (HOSPITAL_COMMUNITY): Payer: Medicare Other | Admitting: Physical Therapy

## 2019-08-23 ENCOUNTER — Inpatient Hospital Stay (HOSPITAL_COMMUNITY): Payer: Medicare Other

## 2019-08-23 ENCOUNTER — Inpatient Hospital Stay (HOSPITAL_COMMUNITY): Payer: Medicare Other | Admitting: Speech Pathology

## 2019-08-23 LAB — GLUCOSE, CAPILLARY
Glucose-Capillary: 109 mg/dL — ABNORMAL HIGH (ref 70–99)
Glucose-Capillary: 78 mg/dL (ref 70–99)
Glucose-Capillary: 97 mg/dL (ref 70–99)

## 2019-08-23 MED ORDER — DOCUSATE SODIUM 50 MG/5ML PO LIQD
100.0000 mg | Freq: Two times a day (BID) | ORAL | Status: DC
  Administered 2019-08-23 – 2019-09-20 (×44): 100 mg via ORAL
  Filled 2019-08-23 (×51): qty 10

## 2019-08-23 MED ORDER — PNEUMOCOCCAL VAC POLYVALENT 25 MCG/0.5ML IJ INJ: 0.5000 mL | INJECTION | INTRAMUSCULAR | Status: DC

## 2019-08-23 NOTE — Progress Notes (Signed)
[place] PHYSICAL MEDICINE & REHABILITATION PROGRESS NOTE   Subjective/Complaints: Lying in bed. No new complaints. Said he was trying to sleep. Asked him how we was doing and he repeatedly said he was trying to rest  ROS: Limited due to cognitive/behavioral    Objective:   No results found. Recent Labs    08/22/19 0552  WBC 3.7*  HGB 13.2  HCT 38.8*  PLT 326   Recent Labs    08/22/19 0552  NA 136  K 3.9  CL 101  CO2 26  GLUCOSE 113*  BUN 19  CREATININE 1.12  CALCIUM 9.8    Intake/Output Summary (Last 24 hours) at 08/23/2019 1238 Last data filed at 08/23/2019 0759 Gross per 24 hour  Intake 210 ml  Output --  Net 210 ml     Physical Exam: Vital Signs Blood pressure 139/76, pulse 90, temperature 98.2 F (36.8 C), resp. rate 18, height 5\' 7"  (1.702 m), weight 71.4 kg, SpO2 94 %.  Constitutional: No distress . Vital signs reviewed. HEENT: EOMI, oral membranes moist Neck: supple Cardiovascular: RRR without murmur. No JVD    Respiratory/Chest: CTA Bilaterally without wheezes or rales. Normal effort    GI/Abdomen: BS +, non-tender, non-distended Ext: no clubbing, cyanosis, or edema Psych: flat, delayed processing Skin: Warm and dry.  Intact. Psych: Normal mood.  Normal behavior. Musc: No edema in extremities.  No tenderness in extremities. Neuro: Arouses to verbal stim. Responds inappropriately to my questions. Does follow simple one step commands.  Motor: moves all 4's   Assessment/Plan: 1. Functional deficits secondary to TBI which require 3+ hours per day of interdisciplinary therapy in a comprehensive inpatient rehab setting.  Physiatrist is providing close team supervision and 24 hour management of active medical problems listed below.  Physiatrist and rehab team continue to assess barriers to discharge/monitor patient progress toward functional and medical goals  Care Tool:  Bathing    Body parts bathed by patient: Right arm, Left arm, Chest,  Abdomen, Front perineal area, Right upper leg, Left upper leg, Face   Body parts bathed by helper: Buttocks, Right lower leg, Left lower leg     Bathing assist Assist Level: Moderate Assistance - Patient 50 - 74%     Upper Body Dressing/Undressing Upper body dressing   What is the patient wearing?: Pull over shirt    Upper body assist Assist Level: Supervision/Verbal cueing    Lower Body Dressing/Undressing Lower body dressing      What is the patient wearing?: Incontinence brief, Pants     Lower body assist Assist for lower body dressing: Moderate Assistance - Patient 50 - 74%     Toileting Toileting    Toileting assist Assist for toileting: Dependent - Patient 0%     Transfers Chair/bed transfer  Transfers assist  Chair/bed transfer activity did not occur: Safety/medical concerns  Chair/bed transfer assist level: Minimal Assistance - Patient > 75%     Locomotion Ambulation   Ambulation assist   Ambulation activity did not occur: Refused(pt refused gait w/o AD)  Assist level: Moderate Assistance - Patient 50 - 74% Assistive device: Walker-rolling Max distance: 20'   Walk 10 feet activity   Assist  Walk 10 feet activity did not occur: Refused  Assist level: Moderate Assistance - Patient - 50 - 74% Assistive device: Hand held assist   Walk 50 feet activity   Assist Walk 50 feet with 2 turns activity did not occur: Refused  Walk 150 feet activity   Assist           Walk 10 feet on uneven surface  activity   Assist Walk 10 feet on uneven surfaces activity did not occur: Armed forces operational officer activity did not occur: N/A         Wheelchair 50 feet with 2 turns activity    Assist    Wheelchair 50 feet with 2 turns activity did not occur: N/A       Wheelchair 150 feet activity     Assist  Wheelchair 150 feet activity did not occur: N/A       Blood pressure 139/76,  pulse 90, temperature 98.2 F (36.8 C), resp. rate 18, height 5\' 7"  (1.702 m), weight 71.4 kg, SpO2 94 %.  Medical Problem List and Plan: 1.Functional and cognitive deficitssecondary to TBI with skull fracture  Continue CIR  -RLAS V  -team conf today 2. Antithrombotics: -DVT/anticoagulation:Pharmaceutical:Lovenox -antiplatelet therapy: N/a 3. Pain Management:Ultram and/or oxycodone prn.    -continue scheduled tylenol qid for now. 4. Mood:LCSW to follow for evaluation and support when appropriate. -antipsychotic agents: N/A 5. Neuropsych: This patientis notcapable of making decisions on hisown behalf.  Requires enclosure bed still for safety  RLAS V with intermittent agitation 6. Skin/Wound Care:routine pressure relief measures. 7. Fluids/Electrolytes/Nutrition:encourage PO 8. Agitation: At risk for falls.    - continue on hs klonopin 0.25mg  and seroquel 25mg  AM, nightly dose increased to 37.5   quite/controlled environment   -continue propranolol-seems to have helped  -sleep chart ongoing 9. Hyperglycemia: Likely due to tube feeds. Hgb A1C 5.2  10. History of polysubstance abuse: Continue thiamine and folic acid.  11. Dysphagia: D2 thins for now  Eating 100%. Off tF  -labs acceptable    13.  Hyperglycemia-secondary to tube feeds  Sugars normal. Dc'ed cbg checks 14.  Hyponatremia  Sodium 133 on 4/30, up to 136 5/3  LOS: 5 days A FACE TO [place] 08/23/2019, 12:38 PM
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
## 2019-08-23 NOTE — Progress Notes (Signed)
Physical Therapy Session Note  Patient Details  Name: Jeremiah West MRN: [id number] Date of Birth: 10/16/1950  Today's Date: 08/23/2019 PT Individual Time: 1420-1530 PT Individual Time Calculation (min): 70 min   Short Term Goals: Week 1:  PT Short Term Goal 1 (Week 1): Pt will tolerate 60 min of upright activity w/ minimal increase in fatigue PT Short Term Goal 2 (Week 1): Pt will initiate stair training PT Short Term Goal 3 (Week 1): Pt will ambulate 2' w/ mod assist w/ LRAD PT Short Term Goal 4 (Week 1): Pt will initiate functional tasks w/ min cues 50% of the time Week 2:    Week 3:     Skilled Therapeutic Interventions/Progress Updates:     PAIN  Pt denies pain  Pt initially sleeping soundly and difficult to awaken.  Pt initially refuses stating "Let me sleep".  Supine to sit w/max assist due to confusion.  Once upright pt opened eyes, continues to request to sleep.  Encouraged pt to participate and pt reluctantly agreed.  SPT w/max assist due to cognitive state raxia. Pt transported to gym for session.  STS from wc and Gait 27ft w/mod to max assist of 1, using rail to R in hallway then wall to R into gym.  Pt leans moderatly anteriorly  at hips/trunkand shuffles feet L moresoe than R. Transfers to standard chair w/mod to max assist and poor safety awareness w/transition.     Pt rests several min  STS from chair to bedside table for card sorting task but pt unable to perform w/max verbal cues and hand over hand assist to guide.  Attempted x 2 in standing w/min assist and cues for balance w/UE support on table.  Sitting task - attempted ball toss w/pt but pt would engage x 3-4x then stop.  Primarily uses LUE but does attempt to position ball using R hand.  Visually scans for ball to L and to midline but limited to R of midline in sitting. Performed in short bouts due to decreased attention as well as refusal to continue w/task.    STS w/max assist of 1, mult efforts before  completing transition due to motor planning as well as strength. Gait 31ft w/max assist to parallel bars where pt walked length of bars x 4 w/ max assist to initiate, max assist to transition L hand bar to bar w/turning, stops mult times and shouts "Why I got to put up with this shit" but then proceeds w/task.  Turns and sits to wc w/max assist to transition hand placement, max cues for safety, very poor safety awarenss.    Pt transported to quiet hallway. Pt propels wc total of 71ft in bouts of 4-88ft at a time due to poor attention to task.  Requires hand over hand assist to initiate each time.  STS from wc in hallway and gait 97ft to room using rail on L which at times becomes deterrent due to inability to release hand and transition when rail runs out.  Pt will stop and stand in flexed posture in hallway, occasionally agitated and states "where we going"  "cant you just leave me alone?" but then will proceed w/task.  Last 68ft therapist guided pt by leading w/LUE placed overhead to promote extension.  Turn/sit to recliner w/max cues for safety, max assist.  Pt STS from recliner x 2 then returns immediately to sitting despite pt encouraging pt to walk to bed. STS w/mod assist to RW and ambulated 19ft to bed  w/max assist to manage RW w/turning, mod assist for gait, flexed posture w/sig forward lean. Sit to supine w/supervision. Pt left supine in Poseybed w/all zippers fastened for safety, bed in lowest position, and needs in reach.  Pt confused and agitated but able to redirect successfully.  Gait pattern, safety awareness, balance, motorplanning issues, agitation and confusion, apraxia all significantly contribute to pt remaining at high risk of falls.    Therapy Documentation Precautions:  Precautions Precautions: Fall Precaution Comments: Soft waist Posey belt restraint, B wrist restraints Restrictions Weight Bearing Restrictions: No    Therapy/Group: Individual Therapy  Callie Fielding, [place] 08/23/2019, 3:52 PM

## 2019-08-23 NOTE — Progress Notes (Signed)
Occupational Therapy Session Note  Patient Details  Name: Jeremiah West MRN: [id number] Date of Birth: 1951-03-23  Today's Date: 08/23/2019 OT Individual Time: 0815-0910 OT Individual Time Calculation (min): 55 min    Short Term Goals: Week 1:  OT Short Term Goal 1 (Week 1): Pt will consistently transfer to toilet wiht MIN A OT Short Term Goal 2 (Week 1): Pt will thread BLE into pants demoing improved dressing praxis OT Short Term Goal 3 (Week 1): Pt will stand to groom with MIN A and MOD VC for sequncing OT Short Term Goal 4 (Week 1): Pt will bathe UB with MOD VC for initation  Skilled Therapeutic Interventions/Progress Updates:    Pt asleep in enclosure bed upon arrival.  Pt required max multimodal cues for arousal throughout session.  OT intervention with focus on self care tasks with sit<>stand from EOB. Pt requires tactile cues to initiate sitting EOB but able to complete tasks with CGA.  Pt attempted to return to supine numerous times but finally remained seated EOB long enough to complete bathing/dressing tasks with sit<stand from EOB.  Pt required max multimodal cues to initiate standing but able to stand with min A. Pt with increased lethargy this morning, requiring extra time to initiate and complete tasks.  Pt required increased assistance with pants today, partially because he was unable to attend to task. Pt incontinent of bladder in brief with no awareness. Pt returned to enclosure bed with all needs within reach.  Enclosure bed secured.  Therapy Documentation Precautions:  Precautions Precautions: Fall Precaution Comments: Soft waist Posey belt restraint, B wrist restraints Restrictions Weight Bearing Restrictions: No   Pain:  Pt denies pain this morning  Therapy/Group: Individual Therapy  Rich Brave 08/23/2019, 9:16 AM

## 2019-08-23 NOTE — Plan of Care (Signed)
  Problem: Consults Goal: RH BRAIN INJURY PATIENT EDUCATION Description: Description: See Patient Education module for eduction specifics Outcome: Progressing Goal: Nutrition Consult-if indicated Outcome: Progressing Goal: Diabetes Guidelines if Diabetic/Glucose > 140 Description: If diabetic or lab glucose is > 140 mg/dl - Initiate Diabetes/Hyperglycemia Guidelines & Document Interventions  Outcome: Progressing   Problem: RH BOWEL ELIMINATION Goal: RH STG MANAGE BOWEL WITH ASSISTANCE Description: STG Manage Bowel with min Assistance. Outcome: Progressing   Problem: RH BLADDER ELIMINATION Goal: RH STG MANAGE BLADDER WITH ASSISTANCE Description: STG Manage Bladder With min Assistance Outcome: Progressing   Problem: RH SAFETY Goal: RH STG ADHERE TO SAFETY PRECAUTIONS W/ASSISTANCE/DEVICE Description: STG Adhere to Safety Precautions With min/mod Assistance/Device. Outcome: Progressing   Problem: RH COGNITION-NURSING Goal: RH STG USES MEMORY AIDS/STRATEGIES W/ASSIST TO PROBLEM SOLVE Description: STG Uses Memory Aids/Strategies With min/mod Assistance to Problem Solve. Outcome: Progressing Goal: RH STG ANTICIPATES NEEDS/CALLS FOR ASSIST W/ASSIST/CUES Description: STG Anticipates Needs/Calls for Assist With min/mod Assistance/Cues. Outcome: Progressing   Problem: RH KNOWLEDGE DEFICIT BRAIN INJURY Goal: RH STG INCREASE KNOWLEDGE OF SELF CARE AFTER BRAIN INJURY Description: With min/mod assist Outcome: Progressing Goal: RH STG INCREASE KNOWLEDGE OF DYSPHAGIA/FLUID INTAKE Description: With min/mod assist Outcome: Progressing

## 2019-08-23 NOTE — Progress Notes (Signed)
Speech Language Pathology Daily Session Note  Patient Details  Name: Jeremiah West MRN: [id number] Date of Birth: 11-23-1950  Today's Date: 08/23/2019 SLP Individual Time: 1230-1310 SLP Individual Time Calculation (min): 40 min  Short Term Goals: Week 1: SLP Short Term Goal 1 (Week 1): Patient will demonstrate sustained attention to functional tasks for 5 minutes with Mod verbal cues for redirection. SLP Short Term Goal 2 (Week 1): Patient will initiate functional tasks in 50% of opportunities with Mod verbal cues. SLP Short Term Goal 3 (Week 1): Patient will demonstrate orientation X 4 with Max A multimodal cues. SLP Short Term Goal 4 (Week 1): Patient will consume current diet with minimal overt s/s of aspiration with Mod verbal cues use of swallowing compensatory strategies. SLP Short Term Goal 5 (Week 1): Patient will verbalize basic wants/needs at the phrase level with Mod verbal cues.  Skilled Therapeutic Interventions: Skilled treatment session focused on cognitive and dysphagia goals. SLP facilitated session by providing extra time and overall Max A multimodal cues for problem solving while patient donned pants and transfered to the wheelchair. Patient lethargic today and required Mod-Max A verbal cues for arousal. Due to fatigue, very limited verbal output noted today. Patient self-fed his lunch meal of Dys. 2 textures with thin liquids without overt s/s of aspiration but required Max-total A verbal and tactile cues for utilization of small bites and to clear oral cavity prior to another bite. Patient was falling asleep during the meal, therefore, he was transferred back to the enclosure bed. Patient left supine with all needs within reach. Continue with current plan of care.      Pain No/Denies Pain   Therapy/Group: Individual Therapy  Montravious Weigelt 08/23/2019, 1:13 PM
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
## 2019-08-23 NOTE — Progress Notes (Signed)
Physical Therapy Session Note  Patient Details  Name: Jeremiah West MRN: [id number] Date of Birth: [birth year]  Today's Date: 08/23/2019 PT Individual Time: 1025-1050 PT Individual Time Calculation (min): 25 min   Short Term Goals: Week 1:  PT Short Term Goal 1 (Week 1): Pt will tolerate 60 min of upright activity w/ minimal increase in fatigue PT Short Term Goal 2 (Week 1): Pt will initiate stair training PT Short Term Goal 3 (Week 1): Pt will ambulate 75' w/ mod assist w/ LRAD PT Short Term Goal 4 (Week 1): Pt will initiate functional tasks w/ min cues 50% of the time  Skilled Therapeutic Interventions/Progress Updates:    Pt received supine in enclosure bed, reports soreness in B knees at rest, not rated and declines intervention. Agreeable with encouragement to participate in therapy session. Bed mobility at Supervision level. Stand pivot transfer bed to w/c with RW and min A. Pt agreeable to short distance ambulation. Ambulation x 20 ft in room with use of RW at mod A level, max cueing for safe RW management. Pt with poor ability to follow cues for safety and pushes RW far ahead of him with trunk in a flexed position. Encouraged pt to attempt gait with HHA vs use of RW but unable to follow cues. Pt requests to return to bed. Sit to supine Supervision. Pt left supine in enclosure bed with needs in reach at end of session.  Therapy Documentation Precautions:  Precautions Precautions: Fall Precaution Comments: Soft waist Posey belt restraint, B wrist restraints Restrictions Weight Bearing Restrictions: No    Therapy/Group: Individual Therapy   Peter Congo, PT, DPT  08/23/2019, 10:51 AM

## 2019-08-24 ENCOUNTER — Inpatient Hospital Stay (HOSPITAL_COMMUNITY): Payer: Medicare Other

## 2019-08-24 ENCOUNTER — Inpatient Hospital Stay (HOSPITAL_COMMUNITY): Payer: Medicare Other | Admitting: Physical Therapy

## 2019-08-24 NOTE — Progress Notes (Signed)
Occupational Therapy Session Note  Patient Details  Name: MARKISE HAYMER MRN: [id number] Date of Birth: December 26, 1950  Today's Date: 08/24/2019 OT Individual Time: 1330-1430 OT Individual Time Calculation (min): 60 min    Short Term Goals: Week 1:  OT Short Term Goal 1 (Week 1): Pt will consistently transfer to toilet wiht MIN A OT Short Term Goal 2 (Week 1): Pt will thread BLE into pants demoing improved dressing praxis OT Short Term Goal 3 (Week 1): Pt will stand to groom with MIN A and MOD VC for sequncing OT Short Term Goal 4 (Week 1): Pt will bathe UB with MOD VC for initation  Skilled Therapeutic Interventions/Progress Updates:    1:1. Pt received in w/c agreeable to shower this date. Pt completes shower transfer to TTB in roll in shower and bathes UB about 10x prior to OT cueing to move to LB. Pt completes bathing peri area and buttocks in standing with bar and min A. Pt continues to attempt to wash body parts after water off and towel around shoudlers d/t decreased termination. Pt completes UB dressing with S and requires A to thread LB into underwear. Pt able to thread BLE into pants and advance past hips with MIN A sit to stand at sink. Pt grooms in standing at sink with sink nearly holding pt up d/t anterior lean. Pt completes folding towel activity following OT direction for BUE coordination and GMC/pinch. Exited session with pt seated in w/c at RN station with belt alarm on and call light in reach  Therapy Documentation Precautions:  Precautions Precautions: Fall Precaution Comments: Soft waist Posey belt restraint, B wrist restraints Restrictions Weight Bearing Restrictions: No General:   Vital Signs:   Pain: Pain Assessment Pain Scale: Faces Faces Pain Scale: Hurts even more Pain Type: Acute pain Pain Location: Head Pain Orientation: Right;Left Pain Descriptors / Indicators: Headache Pain Frequency: Intermittent Pain Onset: On-going Patients Stated Pain Goal:  2 Pain Intervention(s): Medication (See eMAR) ADL: ADL Grooming: Moderate assistance Where Assessed-Grooming: Standing at sink Upper Body Bathing: Maximal assistance Where Assessed-Upper Body Bathing: Sitting at sink Lower Body Bathing: Maximal assistance Where Assessed-Lower Body Bathing: Sitting at sink Upper Body Dressing: Moderate assistance Where Assessed-Upper Body Dressing: Sitting at sink Lower Body Dressing: Maximal assistance Where Assessed-Lower Body Dressing: Sitting at sink, Standing at sink Toilet Transfer: Minimal assistance Toilet Transfer Method: Stand pivot Toilet Transfer Equipment: Grab bars Vision   Perception    Praxis   Exercises:   Other Treatments:     Therapy/Group: Individual Therapy  Shon Hale 08/24/2019, 1:24 PM

## 2019-08-24 NOTE — Progress Notes (Signed)
Speech Language Pathology Daily Session Note  Patient Details  Name: Jeremiah West MRN: [id number] Date of Birth: [birth year]  Today's Date: 08/24/2019 SLP Individual Time: 5643-3295 SLP Individual Time Calculation (min): 26 min  Short Term Goals: Week 1: SLP Short Term Goal 1 (Week 1): Patient will demonstrate sustained attention to functional tasks for 5 minutes with Mod verbal cues for redirection. SLP Short Term Goal 2 (Week 1): Patient will initiate functional tasks in 50% of opportunities with Mod verbal cues. SLP Short Term Goal 3 (Week 1): Patient will demonstrate orientation X 4 with Max A multimodal cues. SLP Short Term Goal 4 (Week 1): Patient will consume current diet with minimal overt s/s of aspiration with Mod verbal cues use of swallowing compensatory strategies. SLP Short Term Goal 5 (Week 1): Patient will verbalize basic wants/needs at the phrase level with Mod verbal cues.  Skilled Therapeutic Interventions: Skilled ST services focused on swallow and cognitive skills. SLP assisted NT with changing of brief, pt required mod A verbal cues for problem solving while changing brief in standing position. SLP facilitated basic problem solving and initiation, washing hand s at sink, pt initialed 1 out 5 steps and required max A verbal cues to initiate/problem solve remaining steps. SLP also facilitated PO intake of dys 2 and thin via straw snack, pt required max verbal encourage to consume 5 spoonfuls and initate bring spoon to mouth, with swallow function appearing WFL and no overt s/s aspiration. Pt was left behind nursing station with chair alarm set. ST recommends to continue skilled ST services.      Pain Pain Assessment Faces Pain Scale: Hurts a little bit PAINAD (Pain Assessment in Advanced Dementia) Breathing: normal Negative Vocalization: none Facial Expression: smiling or inexpressive Body Language: tense, distressed pacing, fidgeting Consolability: no need to  console PAINAD Score: 1  Therapy/Group: Individual Therapy  Fidel Caggiano  [facility] 08/24/2019, 8:26 AM

## 2019-08-24 NOTE — Progress Notes (Addendum)
Physical Therapy Session Note  Patient Details  Name: Jeremiah West MRN: [id number] Date of Birth: Jul 30, 1950  Today's Date: 08/24/2019 PT Individual Time: 7903-8333 AND 1110-1125 AND 1522-1530 PT Individual Time Calculation (min): 10 min AND 15 min AND 8 min  Short Term Goals: Week 1:  PT Short Term Goal 1 (Week 1): Pt will tolerate 60 min of upright activity w/ minimal increase in fatigue PT Short Term Goal 2 (Week 1): Pt will initiate stair training PT Short Term Goal 3 (Week 1): Pt will ambulate 74' w/ mod assist w/ LRAD PT Short Term Goal 4 (Week 1): Pt will initiate functional tasks w/ min cues 50% of the time  Skilled Therapeutic Interventions/Progress Updates:   Session 1:  Pt received in supine, asleep and mod tactile/verbal cues to wake up. Pt minimally opening L eye. Pt kept pulling covers over legs. Pt stated "I'm going to sleep all day". Max encouragement to get up and OOB, encouraged to wash up or go outside. Pt continued to keep covers over himself, stated "no", and falling back asleep. Pt remained in enclosure bed w/ all needs in reach.   Session 2:  Made 2nd attempt at therapy session. Pt more difficult to wake up, pt would again minimally open L eye and made grunting noises, no discernable sounds at first. Pt's brief noted to be soiled of urine. Total assist to remove brief and place new one, dependent to roll R/L. Max encouragement again to participate. Dependent supine>sit in which pt did not open eyes any further but held his balance briefly w/ supervision before laying back down and pulling covers over himself. This occurred 2x in attempt to wake up pt more. Pt remained sleepy, eyes closed, and he rolled over facing away from therapist. Pt remained in enclosure bed and all needs in reach.   Session 3: Pt received in w/c at RN station, much more alert and agreeable to therapy, no c/o pain. Sit>stand w/ mod assist and ambulated 15' w/ mod-max assist via R HHA. Improved  upright posture and attention to task, only min cues needed for initiation however pt very slow still. Once back in seated, pt reports he can't get up all the way 2/2 L knee pain. He reports it is chronic and he has previously used a cane w/ exacerbations of pain in the past. This is the most clear pt's language has sounded since therapist first met pt at evaluation. Pt requested to trial a cane during next therapy session. Pt remained in w/c at nurses station at end of session. Under direct supervision of RN.   Missed 42 min of skilled PT in total today 2/2 fatigue, refusal.   Therapy Documentation Precautions:  Precautions Precautions: Fall Precaution Comments: Soft waist Posey belt restraint, B wrist restraints Restrictions Weight Bearing Restrictions: No Vital Signs:   Pain:    Therapy/Group: Individual Therapy  Marigene Erler [name] 08/24/2019, 11:47 AM

## 2019-08-24 NOTE — Progress Notes (Signed)
Social Work Patient ID: Jeremiah West, male   DOB: Apr 05, 1951, 69 y.o.   MRN: [id number]   SW left message for pt sister [name] [phone]) to provide updates from team conference, ELOS 2-3 weeks, packet left in room with SNF list, and new patient appt scheduled. SW also requested follow-up to determine if there were any other family able to provide care. SW waiting on follow-up.   NCPASSR# in process at this time.    Cecile Sheerer, MSW, LCSWA Office: [phone] Cell: [phone] Fax: [phone]

## 2019-08-24 NOTE — Plan of Care (Signed)
  Problem: Consults Goal: RH BRAIN INJURY PATIENT EDUCATION Description: Description: See Patient Education module for eduction specifics Outcome: Progressing Goal: Nutrition Consult-if indicated Outcome: Progressing Goal: Diabetes Guidelines if Diabetic/Glucose > 140 Description: If diabetic or lab glucose is > 140 mg/dl - Initiate Diabetes/Hyperglycemia Guidelines & Document Interventions  Outcome: Progressing   Problem: RH BOWEL ELIMINATION Goal: RH STG MANAGE BOWEL WITH ASSISTANCE Description: STG Manage Bowel with min Assistance. Outcome: Progressing   Problem: RH BLADDER ELIMINATION Goal: RH STG MANAGE BLADDER WITH ASSISTANCE Description: STG Manage Bladder With min Assistance Outcome: Progressing   Problem: RH SAFETY Goal: RH STG ADHERE TO SAFETY PRECAUTIONS W/ASSISTANCE/DEVICE Description: STG Adhere to Safety Precautions With min/mod Assistance/Device. Outcome: Progressing   Problem: RH COGNITION-NURSING Goal: RH STG USES MEMORY AIDS/STRATEGIES W/ASSIST TO PROBLEM SOLVE Description: STG Uses Memory Aids/Strategies With min/mod Assistance to Problem Solve. Outcome: Progressing Goal: RH STG ANTICIPATES NEEDS/CALLS FOR ASSIST W/ASSIST/CUES Description: STG Anticipates Needs/Calls for Assist With min/mod Assistance/Cues. Outcome: Progressing   Problem: RH KNOWLEDGE DEFICIT BRAIN INJURY Goal: RH STG INCREASE KNOWLEDGE OF SELF CARE AFTER BRAIN INJURY Description: With min/mod assist Outcome: Progressing Goal: RH STG INCREASE KNOWLEDGE OF DYSPHAGIA/FLUID INTAKE Description: With min/mod assist Outcome: Progressing   

## 2019-08-24 NOTE — Progress Notes (Signed)
Notified provider about the non-violent restraints order. Provider stated it was renewed yesterday. Requested to get an order for tomorrow morning.

## 2019-08-24 NOTE — Progress Notes (Signed)
Occupational Therapy Session Note  Patient Details  Name: Jeremiah West MRN: [id number] Date of Birth: Oct 04, 1950  Today's Date: 08/24/2019 OT Individual Time: 7169-6789 OT Individual Time Calculation (min): 25 min  and Today's Date: 08/24/2019 OT Missed Time: 20 Minutes Missed Time Reason: Patient unwilling/refused to participate without medical reason;Patient fatigue   Short Term Goals: Week 1:  OT Short Term Goal 1 (Week 1): Pt will consistently transfer to toilet wiht MIN A OT Short Term Goal 2 (Week 1): Pt will thread BLE into pants demoing improved dressing praxis OT Short Term Goal 3 (Week 1): Pt will stand to groom with MIN A and MOD VC for sequncing OT Short Term Goal 4 (Week 1): Pt will bathe UB with MOD VC for initation  Skilled Therapeutic Interventions/Progress Updates:    Pt asleep in enclosure bed upon arrival. Pt required mod multimodal cues to initially arouse but pt unable/unwilling to keep eyes open. Multiple attempts made to engage pt in bathing and changing clothing.  Max A for supine>sit EOB but pt would immediately return to supine and pull covers over body. Pt resistant to remaining seated EOB and/or standing. Pt remained in secured enclosure bed.  All needs within reach. TV off.  Therapy Documentation Precautions:  Precautions Precautions: Fall Precaution Comments: Restrictions Weight Bearing Restrictions: No General: General OT Amount of Missed Time: 20 Minutes   Therapy/Group: Individual Therapy  Rich Brave 08/24/2019, 8:50 AM

## 2019-08-25 ENCOUNTER — Inpatient Hospital Stay (HOSPITAL_COMMUNITY): Payer: Medicare Other

## 2019-08-25 ENCOUNTER — Inpatient Hospital Stay (HOSPITAL_COMMUNITY): Payer: Medicare Other | Admitting: Physical Therapy

## 2019-08-25 ENCOUNTER — Inpatient Hospital Stay (HOSPITAL_COMMUNITY): Payer: Medicare Other | Admitting: Speech Pathology

## 2019-08-25 NOTE — Progress Notes (Signed)
Speech Language Pathology Daily Session Note  Patient Details  Name: Jeremiah West MRN: [id number] Date of Birth: 12/30/1950  Today's Date: 08/25/2019 SLP Individual Time: 1400-1453 SLP Individual Time Calculation (min): 53 min  Short Term Goals: Week 1: SLP Short Term Goal 1 (Week 1): Patient will demonstrate sustained attention to functional tasks for 5 minutes with Mod verbal cues for redirection. SLP Short Term Goal 2 (Week 1): Patient will initiate functional tasks in 50% of opportunities with Mod verbal cues. SLP Short Term Goal 3 (Week 1): Patient will demonstrate orientation X 4 with Max A multimodal cues. SLP Short Term Goal 4 (Week 1): Patient will consume current diet with minimal overt s/s of aspiration with Mod verbal cues use of swallowing compensatory strategies. SLP Short Term Goal 5 (Week 1): Patient will verbalize basic wants/needs at the phrase level with Mod verbal cues.  Skilled Therapeutic Interventions: Skilled treatment session focused on cognitive goals. SLP facilitated session by providing overall Mod A verbal cues for problem solving and attention during a basic calendar making task. Patient requested to use the bathroom and was continent of bowel and bladder with Mod verbal cues needed for safety and sequencing with task. Patient reported a headache and declined medications but requested to get back into bed. Patient transferred back to enclosure bed at end of session with all needs within reach. Continue with current plan of care.      Pain No/Denies Pain    Therapy/Group: Individual Therapy  Glendola Friedhoff 08/25/2019, 3:23 PM
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
## 2019-08-25 NOTE — Progress Notes (Signed)
Occupational Therapy Note  Patient Details  Name: Jeremiah West MRN: [id number] Date of Birth: 1950-11-28  Today's Date: 08/25/2019 OT Missed Time: 45 Minutes Missed Time Reason: Patient unwilling/refused to participate without medical reason;Patient fatigue  Pt missed 45 mins skilled OT services.  Pt asleep upon arrival.  Pt responds to verbal commands but continually stated he wasn't going to get up.  Pt refused eating breakfast. Pt remained in enclosure bed with head covered by blanket.    Lavone Neri [facility] 08/25/2019, 8:57 AM

## 2019-08-25 NOTE — Plan of Care (Signed)
  Problem: Consults Goal: RH BRAIN INJURY PATIENT EDUCATION Description: Description: See Patient Education module for eduction specifics Outcome: Progressing Goal: Nutrition Consult-if indicated Outcome: Progressing Goal: Diabetes Guidelines if Diabetic/Glucose > 140 Description: If diabetic or lab glucose is > 140 mg/dl - Initiate Diabetes/Hyperglycemia Guidelines & Document Interventions  Outcome: Progressing   Problem: RH BOWEL ELIMINATION Goal: RH STG MANAGE BOWEL WITH ASSISTANCE Description: STG Manage Bowel with min Assistance. Outcome: Progressing   Problem: RH BLADDER ELIMINATION Goal: RH STG MANAGE BLADDER WITH ASSISTANCE Description: STG Manage Bladder With min Assistance Outcome: Progressing   Problem: RH SAFETY Goal: RH STG ADHERE TO SAFETY PRECAUTIONS W/ASSISTANCE/DEVICE Description: STG Adhere to Safety Precautions With min/mod Assistance/Device. Outcome: Progressing   Problem: RH COGNITION-NURSING Goal: RH STG USES MEMORY AIDS/STRATEGIES W/ASSIST TO PROBLEM SOLVE Description: STG Uses Memory Aids/Strategies With min/mod Assistance to Problem Solve. Outcome: Progressing Goal: RH STG ANTICIPATES NEEDS/CALLS FOR ASSIST W/ASSIST/CUES Description: STG Anticipates Needs/Calls for Assist With min/mod Assistance/Cues. Outcome: Progressing   Problem: RH KNOWLEDGE DEFICIT BRAIN INJURY Goal: RH STG INCREASE KNOWLEDGE OF SELF CARE AFTER BRAIN INJURY Description: With min/mod assist Outcome: Progressing Goal: RH STG INCREASE KNOWLEDGE OF DYSPHAGIA/FLUID INTAKE Description: With min/mod assist Outcome: Progressing   
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
## 2019-08-25 NOTE — Plan of Care (Signed)
  Problem: RH Balance Goal: LTG Patient will maintain dynamic standing balance (PT) Description: LTG:  Patient will maintain dynamic standing balance with assistance during mobility activities (PT) Flowsheets (Taken 08/25/2019 1224) LTG: Pt will maintain dynamic standing balance during mobility activities with:: (LTG downgraded 2/2 d/c plan changed to SNF 5/6 - Arrick Dutton T) Contact Guard/Touching assist Note: LTG downgraded 2/2 d/c plan changed to SNF 5/6 - Javaun Dimperio T   Problem: RH Bed to Chair Transfers Goal: LTG Patient will perform bed/chair transfers w/assist (PT) Description: LTG: Patient will perform bed to chair transfers with assistance (PT). Flowsheets (Taken 08/25/2019 1224) LTG: Pt will perform Bed to Chair Transfers with assistance level: (LTG downgraded 2/2 d/c plan changed to SNF 5/6 - Tyreece Gelles T) Contact Guard/Touching assist Note: LTG downgraded 2/2 d/c plan changed to SNF 5/6 - Umberto Pavek T   Problem: RH Car Transfers Goal: LTG Patient will perform car transfers with assist (PT) Description: LTG: Patient will perform car transfers with assistance (PT). Flowsheets (Taken 08/25/2019 1224) LTG: Pt will perform car transfers with assist:: (LTG downgraded 2/2 d/c plan changed to SNF 5/6 - Nagi Furio T) Contact Guard/Touching assist Note: LTG downgraded 2/2 d/c plan changed to SNF 5/6 - Ladislao Cohenour T   Problem: RH Ambulation Goal: LTG Patient will ambulate in controlled environment (PT) Description: LTG: Patient will ambulate in a controlled environment, # of feet with assistance (PT). Flowsheets Taken 08/25/2019 1224 LTG: Pt will ambulate in controlled environ  assist needed:: (LTG downgraded 2/2 d/c plan changed to SNF 5/6 - Cale Decarolis T) Minimal Assistance - Patient > 75% Taken 08/19/2019 1849 LTG: Ambulation distance in controlled environment: 150' w/ LRAD Note: LTG downgraded 2/2 d/c plan changed to SNF 5/6 - Denesha Brouse T Goal: LTG Patient will ambulate in home environment (PT) Description: LTG: Patient will ambulate in home  environment, # of feet with assistance (PT). Outcome: Not Applicable Flowsheets (Taken 08/25/2019 1224) LTG: Pt will ambulate in home environ  assist needed:: (LTG discontinued 2/2 d/c plan changed to SNF 5/6 - Iyania Denne T) -- Note: LTG discontinued 2/2 d/c plan changed to SNF 5/6 - Ovida Delagarza T   Problem: RH Stairs Goal: LTG Patient will ambulate up and down stairs w/assist (PT) Description: LTG: Patient will ambulate up and down # of stairs with assistance (PT) Outcome: Not Applicable Flowsheets (Taken 08/25/2019 1224) LTG: Pt will ambulate up/down stairs assist needed:: (LTG discontinued 2/2 d/c plan changed to SNF 5/6 - Aliece Honold T) -- Note: LTG discontinued 2/2 d/c plan changed to SNF 5/6 - Joshlyn Beadle T

## 2019-08-25 NOTE — Progress Notes (Signed)
[facility] PHYSICAL MEDICINE & REHABILITATION PROGRESS NOTE   Subjective/Complaints: Lying on his side in bed. No new problems reported. Slept reasonably last night  ROS: Limited due to cognitive/behavioral    Objective:   No results found. No results for input(s): WBC, HGB, HCT, PLT in the last 72 hours. No results for input(s): NA, K, CL, CO2, GLUCOSE, BUN, CREATININE, CALCIUM in the last 72 hours.  Intake/Output Summary (Last 24 hours) at 08/25/2019 1025 Last data filed at 08/24/2019 1900 Gross per 24 hour  Intake 570 ml  Output --  Net 570 ml     Physical Exam: Vital Signs Blood pressure (!) 114/52, pulse 63, temperature 98.5 F (36.9 C), temperature source Oral, resp. rate 14, height 5\' 7"  (1.702 m), weight 71.4 kg, SpO2 94 %.  Constitutional: No distress . Vital signs reviewed. HEENT: EOMI, oral membranes moist Neck: supple Cardiovascular: RRR without murmur. No JVD    Respiratory/Chest: CTA Bilaterally without wheezes or rales. Normal effort    GI/Abdomen: BS +, non-tender, non-distended Ext: no clubbing, cyanosis, or edema Psych: lethargic this morning. Slow to follow commands Skin: Warm and dry.  Intact. Psych: Normal mood.  Normal behavior. Musc: No edema in extremities.  No tenderness in extremities. Neuro: Arouses to verbal stim. Responds inappropriately to my questions. Does follow simple one step commands.  Motor: moves all 4's spontaneously   Assessment/Plan: 1. Functional deficits secondary to TBI which require 3+ hours per day of interdisciplinary therapy in a comprehensive inpatient rehab setting.  Physiatrist is providing close team supervision and 24 hour management of active medical problems listed below.  Physiatrist and rehab team continue to assess barriers to discharge/monitor patient progress toward functional and medical goals  Care Tool:  Bathing    Body parts bathed by patient: Right arm, Left arm, Chest, Abdomen, Front perineal area,  Right upper leg, Left upper leg, Face, Buttocks   Body parts bathed by helper: Right lower leg, Left lower leg     Bathing assist Assist Level: Minimal Assistance - Patient > 75%     Upper Body Dressing/Undressing Upper body dressing   What is the patient wearing?: Pull over shirt    Upper body assist Assist Level: Supervision/Verbal cueing    Lower Body Dressing/Undressing Lower body dressing      What is the patient wearing?: Incontinence brief, Pants     Lower body assist Assist for lower body dressing: Moderate Assistance - Patient 50 - 74%     Toileting Toileting    Toileting assist Assist for toileting: Dependent - Patient 0%     Transfers Chair/bed transfer  Transfers assist  Chair/bed transfer activity did not occur: Safety/medical concerns  Chair/bed transfer assist level: Maximal Assistance - Patient 25 - 49%     Locomotion Ambulation   Ambulation assist   Ambulation activity did not occur: Refused(pt refused gait w/o AD)  Assist level: Maximal Assistance - Patient 25 - 49% Assistive device: No Device Max distance: 85   Walk 10 feet activity   Assist  Walk 10 feet activity did not occur: Refused  Assist level: Maximal Assistance - Patient 25 - 49% Assistive device: No Device   Walk 50 feet activity   Assist Walk 50 feet with 2 turns activity did not occur: Refused  Assist level: Maximal Assistance - Patient 25 - 49% Assistive device: No Device    Walk 150 feet activity   Assist           Walk 10 feet  on uneven surface  activity   Assist Walk 10 feet on uneven surfaces activity did not occur: Armed forces operational officer activity did not occur: N/A         Wheelchair 50 feet with 2 turns activity    Assist    Wheelchair 50 feet with 2 turns activity did not occur: N/A       Wheelchair 150 feet activity     Assist  Wheelchair 150 feet activity did not occur: N/A        Blood pressure (!) 114/52, pulse 63, temperature 98.5 F (36.9 C), temperature source Oral, resp. rate 14, height 5\' 7"  (1.702 m), weight 71.4 kg, SpO2 94 %.  Medical Problem List and Plan: 1.Functional and cognitive deficitssecondary to TBI with skull fracture  Continue CIR  -RLAS V    2. Antithrombotics: -DVT/anticoagulation:Pharmaceutical:Lovenox -antiplatelet therapy: N/a 3. Pain Management:Ultram and/or oxycodone prn.    -continue scheduled tylenol qid for now. 4. Mood:LCSW to follow for evaluation and support when appropriate. -antipsychotic agents: N/A 5. Neuropsych: This patientis notcapable of making decisions on hisown behalf.  Still requires enclosure bed still for safety  RLAS V  6. Skin/Wound Care:routine pressure relief measures. 7. Fluids/Electrolytes/Nutrition:encourage PO 8. Agitation: At risk for falls.    - continue on hs klonopin 0.25mg  and seroquel 25mg  AM, nightly dose increased to 37.5   quite/controlled environment   -continue propranolol-seems to have helped  -sleep chart ongoing 9. Hyperglycemia: Likely due to tube feeds. Hgb A1C 5.2  10. History of polysubstance abuse: Continue thiamine and folic acid.  11. Dysphagia: D2 thins for now  Eating 75-100%.    -recent labs acceptable    13.  Hyperglycemia-secondary to tube feeds  Sugars normal. Dc'ed cbg checks 14.  Hyponatremia  Sodium 133 on 4/30, up to 136 5/3  LOS: 7 days A FACE TO [place] 08/25/2019, 10:25 AM

## 2019-08-25 NOTE — Progress Notes (Signed)
Occupational Therapy Session Note  Patient Details  Name: Jeremiah West MRN: [id number] Date of Birth: [birth year]  Today's Date: 08/25/2019 OT Individual Time: 1300-1327 OT Individual Time Calculation (min): 27 min    Short Term Goals: Week 1:  OT Short Term Goal 1 (Week 1): Pt will consistently transfer to toilet wiht MIN A OT Short Term Goal 2 (Week 1): Pt will thread BLE into pants demoing improved dressing praxis OT Short Term Goal 3 (Week 1): Pt will stand to groom with MIN A and MOD VC for sequncing OT Short Term Goal 4 (Week 1): Pt will bathe UB with MOD VC for initation  Skilled Therapeutic Interventions/Progress Updates:    1:1. Pt received in recliner at RN station. Pt requires MIN A for stand pivot transfers and direct VC/manual faciliation of hand placement. Pt requires external support from high low table to reach laterally then cross midline with total cuing to locate clothes pins on R and place L on basketball hoop for dynamic reaching. Pt unable to attend to sorting task, sorting cubes by color in standing at table top. Pt demo poor motor planning/unable to follow OT for UB therex with dowel rod, completing bicep curl repeatedly despite demo/HOH A for various exercises. Attempted to have pt count while completing bicep curl, however unable Exited session with pt seated in recliner, call light in reach and all need smet  Therapy Documentation Precautions:  Precautions Precautions: Fall Precaution Comments: Soft waist Posey belt restraint, B wrist restraints Restrictions Weight Bearing Restrictions: No General:   Vital Signs:   Pain:   ADL: ADL Grooming: Moderate assistance Where Assessed-Grooming: Standing at sink Upper Body Bathing: Maximal assistance Where Assessed-Upper Body Bathing: Sitting at sink Lower Body Bathing: Maximal assistance Where Assessed-Lower Body Bathing: Sitting at sink Upper Body Dressing: Moderate assistance Where Assessed-Upper Body  Dressing: Sitting at sink Lower Body Dressing: Maximal assistance Where Assessed-Lower Body Dressing: Sitting at sink, Standing at sink Toilet Transfer: Minimal assistance Toilet Transfer Method: Stand pivot Toilet Transfer Equipment: Grab bars Vision   Perception    Praxis   Exercises:   Other Treatments:     Therapy/Group: Individual Therapy  Shon Hale 08/25/2019, 7:45 AM

## 2019-08-25 NOTE — Progress Notes (Signed)
Physical Therapy Weekly Progress Note  Patient Details  Name: DERELLE COCKRELL MRN: [id number] Date of Birth: [birth year]  Beginning of progress report period: August 19, 2019 End of progress report period: Aug 25, 2019  Today's Date: 08/25/2019 PT Individual Time: 1100-1155 PT Individual Time Calculation (min): 55 min   Patient has met 2 of 4 short term goals. Pt has made slow progress towards LTGs over last week 2/2 significantly impaired motor planning, poor participation and activity tolerance, and delayed processing. He requires min-mod assist for stand pivot transfers and mod-max assist for gait up to 1' w/ HHA x1.   Patient continues to demonstrate the following deficits muscle weakness, decreased cardiorespiratoy endurance, impaired timing and sequencing, unbalanced muscle activation, motor apraxia, decreased coordination and decreased motor planning, R eye blindness (baseline), decreased initiation, decreased attention, decreased awareness, decreased problem solving, decreased safety awareness, decreased memory and delayed processing and decreased sitting balance, decreased standing balance, decreased postural control and decreased balance strategies and therefore will continue to benefit from skilled PT intervention to increase functional independence with mobility.  Patient not progressing toward long term goals.  See goal revision..  Plan of care revisions: LTGs downgraded to supervision-min assist overall and updated to reflect d/c to SNF as pt's family members report they are no longer able to provide assistance at d/c.  PT Short Term Goals Week 1:  PT Short Term Goal 1 (Week 1): Pt will tolerate 60 min of upright activity w/ minimal increase in fatigue PT Short Term Goal 1 - Progress (Week 1): Met PT Short Term Goal 2 (Week 1): Pt will initiate stair training PT Short Term Goal 2 - Progress (Week 1): Progressing toward goal PT Short Term Goal 3 (Week 1): Pt will ambulate 25' w/ mod  assist w/ LRAD PT Short Term Goal 3 - Progress (Week 1): Progressing toward goal PT Short Term Goal 4 (Week 1): Pt will initiate functional tasks w/ min cues 50% of the time PT Short Term Goal 4 - Progress (Week 1): Met Week 2:  PT Short Term Goal 1 (Week 2): Pt will initiate stair training PT Short Term Goal 2 (Week 2): Pt will ambulate 63' w/ mod assist consistently using LRAD PT Short Term Goal 3 (Week 2): Pt will initiate functional tasks w/ only min cues 100% of the time PT Short Term Goal 4 (Week 2): Pt will perform bed<>chair transfer w/ CGA  Skilled Therapeutic Interventions/Progress Updates:   Pt asleep in supine and initially refusing getting OOB. Pt's brief noted to be soiled of urine. Total assist to roll and 2nd helper donned new brief and performed pericare total assist. Dependent supine>sit transfer and once sitting EOB, pt awake and following commands to scoot to EOB. Donned pants and socks total assist. Sit>stand w/ mod assist and total assist to pull pants over hips. Stand pivot to w/c w/ min manual assist, mod verbal/tactile cues for technique. Pt requesting to toilet. Ambulated to toilet w/ mod assist via R HHA, verbal and tactile cues for upright posture, pt w/ heavy forward lean. Stood at toilet to void w/ min assist w/ LUE support on grab bar. Pt soiled pants in process. Stand pivot to shower chair w/ mod assist. Pt agreeable to take shower. Mod assist to remove pants/brief and to remove shirt, only min cues needed for initiation. Pt performed bathing w/ supervision from seated level, max verbal cues for sequencing and attention to task, pt attempting to wash body parts repeatedly. CGA sit<>stand w/  grab bar while pt wash periarea. Total assist to wash buttocks and back. Stand pivot to w/c once finished bathing, min assist. Pt dried off w/o physical assist. Donned shirt and pants in seated w/ mod assist. Total assist to don and pull brief over hips in stance, mod assist to pull  pants over hips. Pt combed hair w/ supervision in seated. Sat-up in w/c to eat a few bites of breakfast w/ set-up assist. Pt's sister present at end of session, educated her on pt's CLOF, anticipated care needs at d/c including 24/7 supervision, and improves pt has made since coming to [facility]. Ambulated 5' to recliner w/ mod assist, HHA on R side. Total assist recliner transport to nurses station. Pt requesting to stay in recliner but refusing further participation session. Ended session sitting up in recliner, under direct supervision of RN at nurses station. Missed 20 min of skilled PT 2/2 refusal/fatigue.   Therapy Documentation Precautions:  Precautions Precautions: Fall Precaution Comments: Soft waist Posey belt restraint, B wrist restraints Restrictions Weight Bearing Restrictions: No  Therapy/Group: Individual Therapy  Naoma Boxell [name] 08/25/2019, 12:24 PM

## 2019-08-25 NOTE — Progress Notes (Signed)
Occupational Therapy Note  Patient Details  Name: Jeremiah West MRN: [id number] Date of Birth: December 04, 1950  Today's Date: 08/25/2019 OT Missed Time: 30 Minutes Missed Time Reason: Patient unwilling/refused to participate without medical reason;Patient fatigue  Pt received in enclosure bed with difficulty with arousal. Offered pt breakfast and ADL options, however pt continues to roll away from OT and cover self up with blanket stating, "no". Pt missed 30 min skilled OT d/t refusal to participate.   Elenore Paddy Lillie Portner 08/25/2019, 9:41 AM

## 2019-08-26 ENCOUNTER — Inpatient Hospital Stay (HOSPITAL_COMMUNITY): Payer: Medicare Other | Admitting: Physical Therapy

## 2019-08-26 ENCOUNTER — Inpatient Hospital Stay (HOSPITAL_COMMUNITY): Payer: Medicare Other | Admitting: Occupational Therapy

## 2019-08-26 ENCOUNTER — Inpatient Hospital Stay (HOSPITAL_COMMUNITY): Payer: Medicare Other

## 2019-08-26 ENCOUNTER — Inpatient Hospital Stay (HOSPITAL_COMMUNITY): Payer: Medicare Other | Admitting: Speech Pathology

## 2019-08-26 NOTE — Progress Notes (Signed)
Occupational Therapy Weekly Progress Note  Patient Details  Name: Jeremiah West MRN: [id number] Date of Birth: 09/24/1950  Beginning of progress report period: August 19, 2019 End of progress report period: Aug 26, 2019  Today's Date: 08/26/2019 OT Individual Time: 1040-1120 OT Individual Time Calculation (min): 40 min    Patient has met 4 of 4 short term goals.  Pt has made slow but steady progress this reporting period. Pt participation has been variable d/t poor participation in morning session d/t fatigue. Pt requires MIN A overall for transfers and up to MOD A for LB ADLs. Pt continues to have significant impaiments in cognition impacting performance of BADLs such as memory, attention and awareness into deficits  Patient continues to demonstrate the following deficits: muscle weakness, decreased cardiorespiratoy endurance, unbalanced muscle activation, motor apraxia, decreased coordination and decreased motor planning, decreased visual acuity and decreased visual perceptual skills, decreased initiation, decreased attention, decreased awareness, decreased problem solving, decreased safety awareness, decreased memory and delayed processing and decreased sitting balance, decreased standing balance, decreased postural control, hemiplegia and decreased balance strategies and therefore will continue to benefit from skilled OT intervention to enhance overall performance with BADL.  Patient progressing toward long term goals..  Plan of care revisions: LB ADLs/functional transfers downraded to CGA overall d/t progress.  OT Short Term Goals Week 1:  OT Short Term Goal 1 (Week 1): Pt will consistently transfer to toilet wiht MIN A OT Short Term Goal 1 - Progress (Week 1): Met OT Short Term Goal 2 (Week 1): Pt will thread BLE into pants demoing improved dressing praxis OT Short Term Goal 2 - Progress (Week 1): Met OT Short Term Goal 3 (Week 1): Pt will stand to groom with MIN A and MOD VC for  sequncing OT Short Term Goal 3 - Progress (Week 1): Met OT Short Term Goal 4 (Week 1): Pt will bathe UB with MOD VC for initation OT Short Term Goal 4 - Progress (Week 1): Met Week 2:  OT Short Term Goal 1 (Week 2): Pt will terminate bathing with min VC OT Short Term Goal 2 (Week 2): Pt will don B socks iwht MIN A OT Short Term Goal 3 (Week 2): Pt will don pants with MIN A for standing balnace only OT Short Term Goal 4 (Week 2): Pt will orient clothing to self with min cuing  Skilled Therapeutic Interventions/Progress Updates:    1:1. Pt received in w/c  at RN station. Pt completes stand pivot transfer with direct short cue to "sit here" on mat with CGA and no AD. Pt completes matching nut and bolt activity with no awareness of size impacting fit of nut onto bolt. Pt requires max VC for scanning to R to locate unpaired pieces. Pt able to select which was smallest and which was largest but unable ot follow commands to place back in bin to clean up for 2 step command following. Pt completes dynavision seated with 17.4 second average reaction time and max multimodal cuing with no attempts by pt to scan for stimuli. Pt completes 2 min UBE for sustained attention, however tasked with pt terminating after 1 min for break. Pt unaware and does not attempted to terminate wihtotu VC. Exited sessionw iht pt seated in enclosure bed, exit alarm on and call light in reavch  Therapy Documentation Precautions:  Precautions Precautions: Fall Precaution Comments: Soft waist Posey belt restraint, B wrist restraints Restrictions Weight Bearing Restrictions: No General:   Vital Signs: Therapy Vitals Temp:  98.1 F (36.7 C) Pulse Rate: 75 Resp: 18 BP: 116/79 Patient Position (if appropriate): Lying Oxygen Therapy SpO2: 98 % O2 Device: Room Air Pain:   ADL: ADL Grooming: Moderate assistance Where Assessed-Grooming: Standing at sink Upper Body Bathing: Maximal assistance Where Assessed-Upper Body  Bathing: Sitting at sink Lower Body Bathing: Maximal assistance Where Assessed-Lower Body Bathing: Sitting at sink Upper Body Dressing: Moderate assistance Where Assessed-Upper Body Dressing: Sitting at sink Lower Body Dressing: Maximal assistance Where Assessed-Lower Body Dressing: Sitting at sink, Standing at sink Toilet Transfer: Minimal assistance Toilet Transfer Method: Stand pivot Toilet Transfer Equipment: Grab bars Vision   Perception    Praxis   Exercises:   Other Treatments:     Therapy/Group: Individual Therapy  Tonny Branch 08/26/2019, 6:57 AM

## 2019-08-26 NOTE — Plan of Care (Signed)
  Problem: Consults Goal: RH BRAIN INJURY PATIENT EDUCATION Description: Description: See Patient Education module for eduction specifics Outcome: Progressing Goal: Nutrition Consult-if indicated Outcome: Progressing Goal: Diabetes Guidelines if Diabetic/Glucose > 140 Description: If diabetic or lab glucose is > 140 mg/dl - Initiate Diabetes/Hyperglycemia Guidelines & Document Interventions  Outcome: Progressing   Problem: RH BOWEL ELIMINATION Goal: RH STG MANAGE BOWEL WITH ASSISTANCE Description: STG Manage Bowel with min Assistance. Outcome: Progressing   Problem: RH BLADDER ELIMINATION Goal: RH STG MANAGE BLADDER WITH ASSISTANCE Description: STG Manage Bladder With min Assistance Outcome: Progressing   Problem: RH SAFETY Goal: RH STG ADHERE TO SAFETY PRECAUTIONS W/ASSISTANCE/DEVICE Description: STG Adhere to Safety Precautions With min/mod Assistance/Device. Outcome: Progressing   Problem: RH COGNITION-NURSING Goal: RH STG USES MEMORY AIDS/STRATEGIES W/ASSIST TO PROBLEM SOLVE Description: STG Uses Memory Aids/Strategies With min/mod Assistance to Problem Solve. Outcome: Progressing Goal: RH STG ANTICIPATES NEEDS/CALLS FOR ASSIST W/ASSIST/CUES Description: STG Anticipates Needs/Calls for Assist With min/mod Assistance/Cues. Outcome: Progressing   Problem: RH KNOWLEDGE DEFICIT BRAIN INJURY Goal: RH STG INCREASE KNOWLEDGE OF SELF CARE AFTER BRAIN INJURY Description: With min/mod assist Outcome: Progressing Goal: RH STG INCREASE KNOWLEDGE OF DYSPHAGIA/FLUID INTAKE Description: With min/mod assist Outcome: Progressing   

## 2019-08-26 NOTE — Progress Notes (Signed)
[place] PHYSICAL MEDICINE & REHABILITATION PROGRESS NOTE   Subjective/Complaints: Slept most of night. No new issues reported by nursing. Patient without complaints  ROS: Limited due to cognitive/behavioral   Objective:   No results found. No results for input(s): WBC, HGB, HCT, PLT in the last 72 hours. No results for input(s): NA, K, CL, CO2, GLUCOSE, BUN, CREATININE, CALCIUM in the last 72 hours.  Intake/Output Summary (Last 24 hours) at 08/26/2019 1103 Last data filed at 08/26/2019 0900 Gross per 24 hour  Intake 680 ml  Output --  Net 680 ml     Physical Exam: Vital Signs Blood pressure 116/79, pulse 75, temperature 98.1 F (36.7 C), resp. rate 18, height 5\' 7"  (1.702 m), weight 71.4 kg, SpO2 98 %.  Constitutional: No distress . Vital signs reviewed. HEENT: EOMI, oral membranes moist Neck: supple Cardiovascular: RRR without murmur. No JVD    Respiratory/Chest: CTA Bilaterally without wheezes or rales. Normal effort    GI/Abdomen: BS +, non-tender, non-distended Ext: no clubbing, cyanosis, or edema Psych: cooperative when cued. distracted Skin: Warm and dry.  Intact. Psych: Normal mood.  Normal behavior. Musc: No edema in extremities.  No tenderness in extremities. Neuro: aroused easily.  Responds inappropriately to my questions. Does follow simple one step commands.  Motor: moves all 4's spontaneously   Assessment/Plan: 1. Functional deficits secondary to TBI which require 3+ hours per day of interdisciplinary therapy in a comprehensive inpatient rehab setting.  Physiatrist is providing close team supervision and 24 hour management of active medical problems listed below.  Physiatrist and rehab team continue to assess barriers to discharge/monitor patient progress toward functional and medical goals  Care Tool:  Bathing    Body parts bathed by patient: Right arm, Left arm, Chest, Abdomen, Front perineal area, Right upper leg, Left upper leg, Face, Right lower  leg, Left lower leg   Body parts bathed by helper: Buttocks     Bathing assist Assist Level: Minimal Assistance - Patient > 75%     Upper Body Dressing/Undressing Upper body dressing   What is the patient wearing?: Pull over shirt    Upper body assist Assist Level: Moderate Assistance - Patient 50 - 74%    Lower Body Dressing/Undressing Lower body dressing      What is the patient wearing?: Pants     Lower body assist Assist for lower body dressing: Moderate Assistance - Patient 50 - 74%     Toileting Toileting    Toileting assist Assist for toileting: Dependent - Patient 0%     Transfers Chair/bed transfer  Transfers assist  Chair/bed transfer activity did not occur: Safety/medical concerns  Chair/bed transfer assist level: Moderate Assistance - Patient 50 - 74%     Locomotion Ambulation   Ambulation assist   Ambulation activity did not occur: Refused(pt refused gait w/o AD)  Assist level: Moderate Assistance - Patient 50 - 74% Assistive device: Hand held assist Max distance: 10'   Walk 10 feet activity   Assist  Walk 10 feet activity did not occur: Refused  Assist level: Moderate Assistance - Patient - 50 - 74% Assistive device: Hand held assist   Walk 50 feet activity   Assist Walk 50 feet with 2 turns activity did not occur: Refused  Assist level: Maximal Assistance - Patient 25 - 49% Assistive device: No Device    Walk 150 feet activity   Assist           Walk 10 feet on uneven surface  activity   Assist Walk 10 feet on uneven surfaces activity did not occur: Armed forces operational officer activity did not occur: N/A         Wheelchair 50 feet with 2 turns activity    Assist    Wheelchair 50 feet with 2 turns activity did not occur: N/A       Wheelchair 150 feet activity     Assist  Wheelchair 150 feet activity did not occur: N/A       Blood pressure 116/79, pulse 75,  temperature 98.1 F (36.7 C), resp. rate 18, height 5\' 7"  (1.702 m), weight 71.4 kg, SpO2 98 %.  Medical Problem List and Plan: 1.Functional and cognitive deficitssecondary to TBI with skull fracture  Continue CIR  -RLAS V    2. Antithrombotics: -DVT/anticoagulation:Pharmaceutical:Lovenox -antiplatelet therapy: N/a 3. Pain Management:Ultram and/or oxycodone prn.    -continue scheduled tylenol qid for now. 4. Mood:LCSW to follow for evaluation and support when appropriate. -antipsychotic agents: N/A 5. Neuropsych: This patientis notcapable of making decisions on hisown behalf.  Still requires enclosure bed still for safety  RLAS V  6. Skin/Wound Care:routine pressure relief measures. 7. Fluids/Electrolytes/Nutrition:encourage PO 8. Agitation: At risk for falls.    - continue on hs klonopin 0.25mg  and seroquel 25mg  AM, nightly dose increased to 37.5   quite/controlled environment   -continue propranolol-seems to have helped  -sleep chart ongoing 9. Hyperglycemia: Likely due to tube feeds. Hgb A1C 5.2  10. History of polysubstance abuse: Continue thiamine and folic acid.  11. Dysphagia: D2 thins for now  Eating well.    -recent labs acceptable    13.  Hyperglycemia-secondary to tube feeds  Sugars normal. Dc'ed cbg checks 14.  Hyponatremia  Sodium 133 on 4/30, up to 136 5/3  5/7 -recheck labs Monday LOS: 8 days A FACE TO [place] 08/26/2019, 11:03 AM

## 2019-08-26 NOTE — Plan of Care (Signed)
  Problem: RH Balance Goal: LTG Patient will maintain dynamic standing with ADLs (OT) Description: LTG:  Patient will maintain dynamic standing balance with assist during activities of daily living (OT)  Flowsheets (Taken 08/26/2019 1609) LTG: Pt will maintain dynamic standing balance during ADLs with: Contact Guard/Touching assist Note: Downgraded d/t progress   Problem: RH Dressing Goal: LTG Patient will perform lower body dressing w/assist (OT) Description: LTG: Patient will perform lower body dressing with assist, with/without cues in positioning using equipment (OT) Flowsheets (Taken 08/26/2019 1609) LTG: Pt will perform lower body dressing with assistance level of: Minimal Assistance - Patient > 75% Note: Downgraded d/t progress   Problem: RH Toileting Goal: LTG Patient will perform toileting task (3/3 steps) with assistance level (OT) Description: LTG: Patient will perform toileting task (3/3 steps) with assistance level (OT)  Flowsheets (Taken 08/26/2019 1609) LTG: Pt will perform toileting task (3/3 steps) with assistance level: Contact Guard/Touching assist Note: Downgraded d/t progress   Problem: RH Toilet Transfers Goal: LTG Patient will perform toilet transfers w/assist (OT) Description: LTG: Patient will perform toilet transfers with assist, with/without cues using equipment (OT) Flowsheets (Taken 08/26/2019 1609) LTG: Pt will perform toilet transfers with assistance level of: Contact Guard/Touching assist Note: Downgraded d/t progress   Problem: RH Tub/Shower Transfers Goal: LTG Patient will perform tub/shower transfers w/assist (OT) Description: LTG: Patient will perform tub/shower transfers with assist, with/without cues using equipment (OT) Flowsheets (Taken 08/26/2019 1609) LTG: Pt will perform tub/shower stall transfers with assistance level of: Contact Guard/Touching assist Note: Downgraded d/t progress   Problem: RH Memory Goal: LTG Patient will demonstrate ability for  day to day recall/carry over during activities of daily living with assistance level (OT) Description: LTG:  Patient will demonstrate ability for day to day recall/carry over during activities of daily living with assistance level (OT). Flowsheets (Taken 08/26/2019 1609) LTG:  Patient will demonstrate ability for day to day recall/carry over during activities of daily living with assistance level (OT): Maximal Assistance - Patient 25 - 49% Note: Downgraded d/t progress   Problem: RH Awareness Goal: LTG: Patient will demonstrate awareness during functional activites type of (OT) Description: LTG: Patient will demonstrate awareness during functional activites type of (OT) Flowsheets (Taken 08/26/2019 1609) Patient will demonstrate awareness during functional activites type of: Emergent LTG: Patient will demonstrate awareness during functional activites type of (OT): Maximal Assistance - Patient 25 - 49% Note: Downgraded d/t progress

## 2019-08-26 NOTE — Progress Notes (Signed)
Speech Language Pathology Weekly Progress and Session Note  Patient Details  Name: Jeremiah West MRN: [id number] Date of Birth: [birth year]  Beginning of progress report period: August 19, 2019 End of progress report period: Aug 26, 2019  Today's Date: 08/26/2019 SLP Individual Time: 1300-1310 SLP Individual Time Calculation (min): 10 min and Today's Date: 08/26/2019 SLP Missed Time: 50 Minutes Missed Time Reason: Patient fatigue  Short Term Goals: Week 1: SLP Short Term Goal 1 (Week 1): Patient will demonstrate sustained attention to functional tasks for 5 minutes with Mod verbal cues for redirection. SLP Short Term Goal 1 - Progress (Week 1): Met SLP Short Term Goal 2 (Week 1): Patient will initiate functional tasks in 50% of opportunities with Mod verbal cues. SLP Short Term Goal 2 - Progress (Week 1): Met SLP Short Term Goal 3 (Week 1): Patient will demonstrate orientation X 4 with Max A multimodal cues. SLP Short Term Goal 3 - Progress (Week 1): Not met SLP Short Term Goal 4 (Week 1): Patient will consume current diet with minimal overt s/s of aspiration with Mod verbal cues use of swallowing compensatory strategies. SLP Short Term Goal 4 - Progress (Week 1): Not met SLP Short Term Goal 5 (Week 1): Patient will verbalize basic wants/needs at the phrase level with Mod verbal cues. SLP Short Term Goal 5 - Progress (Week 1): Met    New Short Term Goals: Week 2: SLP Short Term Goal 1 (Week 2): Patient will demonstrate sustained attention to functional tasks for 15 minutes with Mod verbal cues for redirection. SLP Short Term Goal 2 (Week 2): Patient will demonstrate orientation X 4 with Max A multimodal cues. SLP Short Term Goal 3 (Week 2): Patient will demonstrate basic problem solving for basic and familiar tasks with Mod A verbal and visual cues. SLP Short Term Goal 4 (Week 2): Patient will verbalize basic wants/needs at the phrase level with Min verbal cues. SLP Short Term Goal 5  (Week 2): Patient will consume current diet with minimal overt s/s of aspiration with Mod verbal cues use of swallowing compensatory strategies. SLP Short Term Goal 6 (Week 2): Patient will demonstrate efficient mastication and complete oral clerance with trials of Dys. 3 textures over 2 sessions without overt s/s of aspiration and Mod verbal cues.  Weekly Progress Updates: Patient has made functional gains and has met 4 of 5 STGs this reporting period. Currently, patient demonstrates behaviors consistent with a Rancho Level V-emerging VI and requires overall Mod-Max A multimodal cues to complete functional and familiar tasks safely regarding problem solving and attention. Max-Total A multimodal cues are needed at times for orientation, recall and awareness. Patient's behavior remains cooperative but patient can be lethargic at times which limits his participation intermittently. Patient is consuming Dys. 2 textures with thin liquids with minimal overt s/s of aspiration but requires overall Max A multimodal cues for use of swallowing compensatory strategies due to impulsivity with prolonged mastication and oral residue noted. Therefore, trials of upgraded textures have not been attempted. Patient would benefit from continued skilled SLP intervention to maximize his cognitive-linguistic and swallowing function prior to discharge.     Intensity: Minumum of 1-2 x/day, 30 to 90 minutes Frequency: 3 to 5 out of 7 days Duration/Length of Stay: 2 weeks Treatment/Interventions: Cognitive remediation/compensation;Internal/external aids;Dysphagia/aspiration precaution training;Speech/Language facilitation;Therapeutic Activities;Environmental controls;Cueing hierarchy;Functional tasks;Patient/family education   Daily Session  Skilled Therapeutic Interventions: Skilled treatment session focused on cognitive goals. Upon arrival, patient was asleep in bed. Patient was easily awakened  but declined to get out of bed  despite multiple attempts and Max A multimodal cues from SLP and RN. Patient would grunt "mmhmm" to all requests but did not initiate any movement with the exception of placing the blanket over his head. SLP attempted to assist moving legs to EOB, however, patient was actively resisting. Therefore, patient missed remaining 50 minutes of session. Patient left supine in enclosure bed. RN aware.     Pain No/Denies Pain   Therapy/Group: Individual Therapy  Ralph Brouwer 08/26/2019, 6:40 AM

## 2019-08-26 NOTE — Progress Notes (Signed)
Occupational Therapy Session Note  Patient Details  Name: Jeremiah West MRN: [id number] Date of Birth: 04-15-1951  Today's Date: 08/26/2019 OT Individual Time: 0930-1000 OT Individual Time Calculation (min): 30 min    Short Term Goals: Week 1:  OT Short Term Goal 1 (Week 1): Pt will consistently transfer to toilet wiht MIN A OT Short Term Goal 1 - Progress (Week 1): Met OT Short Term Goal 2 (Week 1): Pt will thread BLE into pants demoing improved dressing praxis OT Short Term Goal 2 - Progress (Week 1): Met OT Short Term Goal 3 (Week 1): Pt will stand to groom with MIN A and MOD VC for sequncing OT Short Term Goal 3 - Progress (Week 1): Met OT Short Term Goal 4 (Week 1): Pt will bathe UB with MOD VC for initation OT Short Term Goal 4 - Progress (Week 1): Met Week 2:  OT Short Term Goal 1 (Week 2): Pt will terminate bathing with min VC OT Short Term Goal 2 (Week 2): Pt will don B socks iwht MIN A OT Short Term Goal 3 (Week 2): Pt will don pants with MIN A for standing balnace only OT Short Term Goal 4 (Week 2): Pt will orient clothing to self with min cuing  Skilled Therapeutic Interventions/Progress Updates:    1:1 Pt in wet soiled bed when arrived. Pt unaware of wet bed. NT reports she had changed the brief but difficulty with guiding pt out of bed. Pt required max encouragement and awareness of surroundings. Pt then was agreeable to ambulating to the bathroom where showering was running for environment cue to shower. Mod A to transfer into shower due to decr motor planning and body awareness. Pt showered sit to stand with min A with extra time with min cues to progress to LB (including feet). Pt maintained forward flexed posture in sitting throughout session. Pt transferred from shower to w/c with stand step transfers with min A with extra time. PT able to don shirt with min  A. Pt able to orient clothing without cuing. PT required A to thread pants due to keeping a forward flexed  posture. Pt performed sit to stand with min guard to pull up pants with steadying A and extra time.  Pt taken to the RN station for supervision with safety belt donned. Pt given workbook with pencils (he was working on yesterday).   Therapy Documentation Precautions:  Precautions Precautions: Fall Precaution Comments: Soft waist Posey belt restraint, B wrist restraints Restrictions Weight Bearing Restrictions: No    Pain: Right LE pain with noted grimaces when walking to the bathroom- allowed for rest breaks as needed.   Therapy/Group: Individual Therapy  Willeen Cass [facility] 08/26/2019, 10:25 AM

## 2019-08-26 NOTE — Progress Notes (Signed)
Occupational Therapy Session Note  Patient Details  Name: Jeremiah West MRN: [id number] Date of Birth: 02-26-1951  Today's Date: 08/26/2019 OT Individual Time: 1415-1444 OT Individual Time Calculation (min): 29 min   Skilled Therapeutic Interventions/Progress Updates:    Pt greeted in bed with no c/o pain, declining participation in OOB self care tasks including toileting, even with OT placed BSC next to bed. Pt lying on his side and minimally conversive. OT brought in a soccer ball and pt initiated transition to EOB himself with supervision assistance. Worked on sitting balance while pt kicked soccer ball back and forth to OT, interchanging Lt and Rt LE use. Pt adamant that he is at home vs hospital, tried to provide correct orientation information however question carryover. Pt required unilateral UE support on the mattress to maintain his balance during this activity. He then initiated bending forward and spinning soccer ball before passing it over to therapist. After a few rounds of this, progressed to ball toss working on unsupported sitting balance without UE support with pt doing very well, no LOBs. Afterwards he transitioned back to supine with increased time to motor plan. Left him in enclosure bed with all needs within reach.   Therapy Documentation Precautions:  Precautions Precautions: Fall Precaution Comments: Soft waist Posey belt restraint, B wrist restraints Restrictions Weight Bearing Restrictions: No Vital Signs: Therapy Vitals Temp: 98.3 F (36.8 C) Pulse Rate: 74 Resp: 20 BP: 120/77 Patient Position (if appropriate): Lying Oxygen Therapy SpO2: 100 % O2 Device: Room Air ADL: ADL Grooming: Moderate assistance Where Assessed-Grooming: Standing at sink Upper Body Bathing: Maximal assistance Where Assessed-Upper Body Bathing: Sitting at sink Lower Body Bathing: Maximal assistance Where Assessed-Lower Body Bathing: Sitting at sink Upper Body Dressing: Moderate  assistance Where Assessed-Upper Body Dressing: Sitting at sink Lower Body Dressing: Maximal assistance Where Assessed-Lower Body Dressing: Sitting at sink, Standing at sink Toilet Transfer: Minimal assistance Toilet Transfer Method: Stand pivot Toilet Transfer Equipment: Grab bars     Therapy/Group: Individual Therapy  Mikia Delaluz A Saher Davee 08/26/2019, 4:23 PM

## 2019-08-26 NOTE — Progress Notes (Signed)
Physical Therapy Session Note  Patient Details  Name: Jeremiah West MRN: [id number] Date of Birth: 1950/05/18  Today's Date: 08/26/2019 PT Individual Time: 6834-1962 PT Individual Time Calculation (min): 24 min   and  Today's Date: 08/26/2019 PT Missed Time: 36 Minutes Missed Time Reason: Patient unwilling to participate  Short Term Goals: Week 2:  PT Short Term Goal 1 (Week 2): Pt will initiate stair training PT Short Term Goal 2 (Week 2): Pt will ambulate 87' w/ mod assist consistently using LRAD PT Short Term Goal 3 (Week 2): Pt will initiate functional tasks w/ only min cues 100% of the time PT Short Term Goal 4 (Week 2): Pt will perform bed<>chair transfer w/ CGA  Skilled Therapeutic Interventions/Progress Updates:    Pt received R sidelying in bed asking about "getting a number" and trying to call someone though pt has difficulty trying to clarify who he wants to call. Attmepted to redirect patient and offered to assist him to bathroom but pt perseverated on trying to "get the number." Supine>sitting L EOB with supervision focusing on upright activity tolerance while discussing who he wanted to call with pt becoming agitated not understanding that it is necessary to know the phone number in order to dial it. Again attempted to redirect patient to attempt ambulation or getting OOB but he was perseverative on calling someone and refuses OOB mobility. Sit>supine with supervision and fastened enclosure bed for safety. Therapist discussed with nursing staff and they report pt's sister, [name], has visited therefore therapist retrieved her number and returned to pt's room. Again attempted to redirect pt to another task but he perseverated on calling someone. With encouragement, pt agreeable to sit EOB and call his sister - attempted to engage pt in dialing the number with max sequential cuing but pt unable and due to poor frustration tolerance therapist dialed number total assist. Pt's sister,  [name], answered and pt started talking to her but despite her pleasantly conversing with him he did not understand that she was talking to him and he became agitated towards her and said he didn't want to speak to her anymore. Again therapist attempted to redirect pt to get OOB but he was becoming more agitated and refused. Sit>supine with supervision and pt left sidelying in enclosure bed with needs in reach and telesitter in place. Missed 36 minutes of skilled physical therapy.  Therapy Documentation Precautions:  Precautions Precautions: Fall Precaution Comments: Soft waist Posey belt restraint, B wrist restraints Restrictions Weight Bearing Restrictions: No  Pain:   No reports of pain throughout session.   Therapy/Group: Individual Therapy  Ginny Forth, PT, DPT 08/26/2019, 3:33 PM

## 2019-08-27 NOTE — Progress Notes (Signed)
Emily NT found pts silver ring in the pts covers. Pts ring was placed in a bag and sealed with pt label and given to charge RN Thurston Hole.

## 2019-08-27 NOTE — Plan of Care (Signed)
  Problem: Consults Goal: RH BRAIN INJURY PATIENT EDUCATION Description: Description: See Patient Education module for eduction specifics Outcome: Progressing Goal: Nutrition Consult-if indicated Outcome: Progressing Goal: Diabetes Guidelines if Diabetic/Glucose > 140 Description: If diabetic or lab glucose is > 140 mg/dl - Initiate Diabetes/Hyperglycemia Guidelines & Document Interventions  Outcome: Progressing   Problem: RH BOWEL ELIMINATION Goal: RH STG MANAGE BOWEL WITH ASSISTANCE Description: STG Manage Bowel with min Assistance. Outcome: Progressing   Problem: RH BLADDER ELIMINATION Goal: RH STG MANAGE BLADDER WITH ASSISTANCE Description: STG Manage Bladder With min Assistance Outcome: Progressing   Problem: RH SAFETY Goal: RH STG ADHERE TO SAFETY PRECAUTIONS W/ASSISTANCE/DEVICE Description: STG Adhere to Safety Precautions With min/mod Assistance/Device. Outcome: Progressing   Problem: RH COGNITION-NURSING Goal: RH STG USES MEMORY AIDS/STRATEGIES W/ASSIST TO PROBLEM SOLVE Description: STG Uses Memory Aids/Strategies With min/mod Assistance to Problem Solve. Outcome: Progressing Goal: RH STG ANTICIPATES NEEDS/CALLS FOR ASSIST W/ASSIST/CUES Description: STG Anticipates Needs/Calls for Assist With min/mod Assistance/Cues. Outcome: Progressing   Problem: RH KNOWLEDGE DEFICIT BRAIN INJURY Goal: RH STG INCREASE KNOWLEDGE OF SELF CARE AFTER BRAIN INJURY Description: With min/mod assist Outcome: Progressing Goal: RH STG INCREASE KNOWLEDGE OF DYSPHAGIA/FLUID INTAKE Description: With min/mod assist Outcome: Progressing   

## 2019-08-27 NOTE — Progress Notes (Signed)
Occupational Therapy Session Note  Patient Details  Name: Jeremiah West MRN: [id number] Date of Birth: [birth year]  Today's Date: 08/27/2019 OT Individual Time: 2334-3568 OT Individual Time Calculation (min): 16 min    Short Term Goals: Week 1:  OT Short Term Goal 1 (Week 1): Pt will consistently transfer to toilet wiht MIN A OT Short Term Goal 1 - Progress (Week 1): Met OT Short Term Goal 2 (Week 1): Pt will thread BLE into pants demoing improved dressing praxis OT Short Term Goal 2 - Progress (Week 1): Met OT Short Term Goal 3 (Week 1): Pt will stand to groom with MIN A and MOD VC for sequncing OT Short Term Goal 3 - Progress (Week 1): Met OT Short Term Goal 4 (Week 1): Pt will bathe UB with MOD VC for initation OT Short Term Goal 4 - Progress (Week 1): Met  Skilled Therapeutic Interventions/Progress Updates:    15 min make up time. Pt received in w/c and provided with breakfast. Pt requires mod VC for slowing rate of consumption of dysphagia textures. At one point pt stuffed nearly 4 spoonfuls of susage in his mouth attempting 4th. OT intercepted stating, "slow down so you dont choke." pt visibly irritated by this stating, "are you done." Ot allowed another bite after pt swallowed food. After eating breakfast, pt reporting wanting to go lay down despite other tx options. Pt escorted towards tx gym, stating, "that's not the way to my room." Pt able to direct OT for pathfinding back to room correctly. Pt stand and lifts LLE into bed and does sideways summersault back into bed (pt states, "this is how I do it at home") despite cues for picot stepes. Exited sesion with pt seated in enclosure bed, zipped up and call light in reach  Therapy Documentation Precautions:  Precautions Precautions: Fall Precaution Comments: Soft waist Posey belt restraint, B wrist restraints Restrictions Weight Bearing Restrictions: No General:   Vital Signs: Therapy Vitals Temp: 98.3 F (36.8 C) Pulse  Rate: 85 Resp: 18 BP: (!) 141/79 Patient Position (if appropriate): Lying Oxygen Therapy SpO2: 97 % O2 Device: Room Air Pain: Pain Assessment Pain Scale: 0-10 Pain Score: Asleep ADL: ADL Grooming: Moderate assistance Where Assessed-Grooming: Standing at sink Upper Body Bathing: Maximal assistance Where Assessed-Upper Body Bathing: Sitting at sink Lower Body Bathing: Maximal assistance Where Assessed-Lower Body Bathing: Sitting at sink Upper Body Dressing: Moderate assistance Where Assessed-Upper Body Dressing: Sitting at sink Lower Body Dressing: Maximal assistance Where Assessed-Lower Body Dressing: Sitting at sink, Standing at sink Toilet Transfer: Minimal assistance Toilet Transfer Method: Stand pivot Toilet Transfer Equipment: Grab bars Vision   Perception    Praxis   Exercises:   Other Treatments:     Therapy/Group: Individual Therapy  Tonny Branch 08/27/2019, 7:43 AM

## 2019-08-27 NOTE — Progress Notes (Signed)
Occupational Therapy Session Note  Patient Details  Name: Jeremiah West MRN: [id number] Date of Birth: [birth year]  Today's Date: 08/27/2019 OT Individual Time: 1335-1400 OT Individual Time Calculation (min): 25 min make up time   Short Term Goals: Week 2:  OT Short Term Goal 1 (Week 2): Pt will terminate bathing with min VC OT Short Term Goal 2 (Week 2): Pt will don B socks iwht MIN A OT Short Term Goal 3 (Week 2): Pt will don pants with MIN A for standing balnace only OT Short Term Goal 4 (Week 2): Pt will orient clothing to self with min cuing  Skilled Therapeutic Interventions/Progress Updates:    Pt received in bed requesting to exit enclosure bed. Pt agreeable to going to bathroom and donning pants. Pt completes stand pivot transfer EOB>w/c with CGA and impulsively attempts to stand from w/c with brakes unlocked when OT attempting to open up door. W/c begins to roll back and OT intercepts to keep pt from falling backwards onto floor and pt lands in w/c. OT educates on waiting for A for mobility d/t fall risk/decreased awareness. Pt able to state, "yeah I almost fell." Pt uses grab bar for stand pivot to toilet with S but requires A to manage pants d/t poor sequencing trying ot sit with brief still on. Once sitting, pt immediately pulls toilet paper and stands to wipe. Pt completes 2x with no BM occurring and no awareness. Pt terminates and transfers back to w/c when OT points out no BM occurred. Pt dons pants with CGA and increased time to initate threading second LE. Pt perseverates on calling daughter and requries max redirection as pt unable to recall number and it is not written down. According to NT pt received call earlier from daughter. Pt eventually redirected to sit in w/c at RN station for supervision and belt alarm on   Therapy Documentation Precautions:  Precautions Precautions: Fall Precaution Comments: Soft waist Posey belt restraint, B wrist  restraints Restrictions Weight Bearing Restrictions: No General:   Vital Signs: Therapy Vitals Temp: (!) 97.4 F (36.3 C) Temp Source: Oral Pulse Rate: 68 Resp: 20 BP: 111/79 Patient Position (if appropriate): Lying Oxygen Therapy O2 Device: Room Air Pain:   ADL: ADL Grooming: Moderate assistance Where Assessed-Grooming: Standing at sink Upper Body Bathing: Maximal assistance Where Assessed-Upper Body Bathing: Sitting at sink Lower Body Bathing: Maximal assistance Where Assessed-Lower Body Bathing: Sitting at sink Upper Body Dressing: Moderate assistance Where Assessed-Upper Body Dressing: Sitting at sink Lower Body Dressing: Maximal assistance Where Assessed-Lower Body Dressing: Sitting at sink, Standing at sink Toilet Transfer: Minimal assistance Toilet Transfer Method: Stand pivot Toilet Transfer Equipment: Grab bars Vision   Perception    Praxis   Exercises:   Other Treatments:     Therapy/Group: Individual Therapy  Shon Hale 08/27/2019, 3:02 PM

## 2019-08-28 ENCOUNTER — Inpatient Hospital Stay (HOSPITAL_COMMUNITY): Payer: Medicare Other

## 2019-08-28 NOTE — Plan of Care (Signed)
  Problem: Consults Goal: RH BRAIN INJURY PATIENT EDUCATION Description: Description: See Patient Education module for eduction specifics Outcome: Progressing Goal: Nutrition Consult-if indicated Outcome: Progressing Goal: Diabetes Guidelines if Diabetic/Glucose > 140 Description: If diabetic or lab glucose is > 140 mg/dl - Initiate Diabetes/Hyperglycemia Guidelines & Document Interventions  Outcome: Progressing   Problem: RH BOWEL ELIMINATION Goal: RH STG MANAGE BOWEL WITH ASSISTANCE Description: STG Manage Bowel with min Assistance. Outcome: Progressing   Problem: RH BLADDER ELIMINATION Goal: RH STG MANAGE BLADDER WITH ASSISTANCE Description: STG Manage Bladder With min Assistance Outcome: Progressing   Problem: RH SAFETY Goal: RH STG ADHERE TO SAFETY PRECAUTIONS W/ASSISTANCE/DEVICE Description: STG Adhere to Safety Precautions With min/mod Assistance/Device. Outcome: Progressing   Problem: RH COGNITION-NURSING Goal: RH STG USES MEMORY AIDS/STRATEGIES W/ASSIST TO PROBLEM SOLVE Description: STG Uses Memory Aids/Strategies With min/mod Assistance to Problem Solve. Outcome: Progressing Goal: RH STG ANTICIPATES NEEDS/CALLS FOR ASSIST W/ASSIST/CUES Description: STG Anticipates Needs/Calls for Assist With min/mod Assistance/Cues. Outcome: Progressing   Problem: RH KNOWLEDGE DEFICIT BRAIN INJURY Goal: RH STG INCREASE KNOWLEDGE OF SELF CARE AFTER BRAIN INJURY Description: With min/mod assist Outcome: Progressing Goal: RH STG INCREASE KNOWLEDGE OF DYSPHAGIA/FLUID INTAKE Description: With min/mod assist Outcome: Progressing   

## 2019-08-28 NOTE — Progress Notes (Signed)
Occupational Therapy Session Note  Patient Details  Name: Jeremiah West MRN: [id number] Date of Birth: [birth year]  Today's Date: 08/28/2019 OT Individual Time: 1300-1330 OT Individual Time Calculation (min): 30 min    Short Term Goals: Week 1:  OT Short Term Goal 1 (Week 1): Pt will consistently transfer to toilet wiht MIN A OT Short Term Goal 1 - Progress (Week 1): Met OT Short Term Goal 2 (Week 1): Pt will thread BLE into pants demoing improved dressing praxis OT Short Term Goal 2 - Progress (Week 1): Met OT Short Term Goal 3 (Week 1): Pt will stand to groom with MIN A and MOD VC for sequncing OT Short Term Goal 3 - Progress (Week 1): Met OT Short Term Goal 4 (Week 1): Pt will bathe UB with MOD VC for initation OT Short Term Goal 4 - Progress (Week 1): Met  Skilled Therapeutic Interventions/Progress Updates:    OT session focused on cognitive skills, functional transfers, and balance. Pt received supine in enclosure bed with RN providing care. Pt agreeable to therapy, however declining standing or transitioning from bed. Engaged in therapeutic conversation with client unable to answer orientation questions or identify preferred hobbies/interests. Engaged in reciprocal ball kicks while seated EOB 3x 20 reps and bouncing back and forth 2x 10 reps to promote balance with good skill noted. Encouraged pt to assist with counting repetitions, however pt unable. Pt requested to toilet, ambulating bed<>toilet with min A and decreased awareness of surroundings. Pt completed toileting with min A for balance and transitioned back to bed without protest. Pt left in enclosure bed with all needs in reach.   Therapy Documentation Precautions:  Precautions Precautions: Fall Precaution Comments: Soft waist Posey belt restraint, B wrist restraints Restrictions Weight Bearing Restrictions: No General:   Vital Signs:   Pain:   ADL: ADL Grooming: Moderate assistance Where Assessed-Grooming:  Standing at sink Upper Body Bathing: Maximal assistance Where Assessed-Upper Body Bathing: Sitting at sink Lower Body Bathing: Maximal assistance Where Assessed-Lower Body Bathing: Sitting at sink Upper Body Dressing: Moderate assistance Where Assessed-Upper Body Dressing: Sitting at sink Lower Body Dressing: Maximal assistance Where Assessed-Lower Body Dressing: Sitting at sink, Standing at sink Toilet Transfer: Minimal assistance Toilet Transfer Method: Stand pivot Toilet Transfer Equipment: Grab bars Vision   Perception    Praxis   Exercises:   Other Treatments:     Therapy/Group: Individual Therapy  Duayne Cal 08/28/2019, 2:38 PM

## 2019-08-29 ENCOUNTER — Inpatient Hospital Stay (HOSPITAL_COMMUNITY): Payer: Medicare Other | Admitting: Speech Pathology

## 2019-08-29 ENCOUNTER — Inpatient Hospital Stay (HOSPITAL_COMMUNITY): Payer: Medicare Other

## 2019-08-29 ENCOUNTER — Inpatient Hospital Stay (HOSPITAL_COMMUNITY): Payer: Medicare Other | Admitting: Occupational Therapy

## 2019-08-29 LAB — CBC
HCT: 41.1 % (ref 39.0–52.0)
Hemoglobin: 13.7 g/dL (ref 13.0–17.0)
MCH: 33.3 pg (ref 26.0–34.0)
MCHC: 33.3 g/dL (ref 30.0–36.0)
MCV: 99.8 fL (ref 80.0–100.0)
Platelets: 264 10*3/uL (ref 150–400)
RBC: 4.12 MIL/uL — ABNORMAL LOW (ref 4.22–5.81)
RDW: 11.8 % (ref 11.5–15.5)
WBC: 3.4 10*3/uL — ABNORMAL LOW (ref 4.0–10.5)
nRBC: 0 % (ref 0.0–0.2)

## 2019-08-29 LAB — BASIC METABOLIC PANEL
Anion gap: 10 (ref 5–15)
BUN: 14 mg/dL (ref 8–23)
CO2: 27 mmol/L (ref 22–32)
Calcium: 9.5 mg/dL (ref 8.9–10.3)
Chloride: 101 mmol/L (ref 98–111)
Creatinine, Ser: 1.19 mg/dL (ref 0.61–1.24)
GFR calc Af Amer: >60 mL/min (ref >60)
GFR calc non Af Amer: >60 mL/min (ref >60)
Glucose, Bld: 105 mg/dL — ABNORMAL HIGH (ref 70–99)
Potassium: 4.3 mmol/L (ref 3.5–5.1)
Sodium: 138 mmol/L (ref 135–145)

## 2019-08-29 NOTE — Progress Notes (Signed)
[facility] PHYSICAL MEDICINE & REHABILITATION PROGRESS NOTE   Subjective/Complaints: Slept most of night. No new issues reported by nursing. Patient without complaints Appears to have eaten most of breakfast.  WBC 3.4 this morning. Labs otherwise stable.  ROS: Limited due to cognitive/behavioral   Objective:   No results found. Recent Labs    08/29/19 0615  WBC 3.4*  HGB 13.7  HCT 41.1  PLT 264   Recent Labs    08/29/19 0615  NA 138  K 4.3  CL 101  CO2 27  GLUCOSE 105*  BUN 14  CREATININE 1.19  CALCIUM 9.5    Intake/Output Summary (Last 24 hours) at 08/29/2019 1131 Last data filed at 08/29/2019 0900 Gross per 24 hour  Intake 590 ml  Output --  Net 590 ml     Physical Exam: Vital Signs Blood pressure 120/70, pulse 80, temperature 98.6 F (37 C), temperature source Oral, resp. rate 16, height 5\' 7"  (1.702 m), weight 71.4 kg, SpO2 100 %.  Constitutional: No distress . Vital signs reviewed. Sitting up in enclosure comfortably; has eaten most of breakfast.  HEENT: EOMI, oral membranes moist Neck: supple Cardiovascular: RRR without murmur. No JVD    Respiratory/Chest: CTA Bilaterally without wheezes or rales. Normal effort    GI/Abdomen: BS +, non-tender, non-distended Ext: no clubbing, cyanosis, or edema Psych: cooperative when cued. distracted Skin: Warm and dry.  Intact. Psych: Normal mood.  Normal behavior. Musc: No edema in extremities.  No tenderness in extremities. Neuro: aroused easily.  Responds inappropriately to my questions. Does follow simple one step commands.  Motor: moves all 4's spontaneously   Assessment/Plan: 1. Functional deficits secondary to TBI which require 3+ hours per day of interdisciplinary therapy in a comprehensive inpatient rehab setting.  Physiatrist is providing close team supervision and 24 hour management of active medical problems listed below.  Physiatrist and rehab team continue to assess barriers to discharge/monitor  patient progress toward functional and medical goals  Care Tool:  Bathing    Body parts bathed by patient: Right arm, Left arm, Chest, Abdomen, Front perineal area, Right upper leg, Left upper leg, Face, Right lower leg, Left lower leg   Body parts bathed by helper: Buttocks     Bathing assist Assist Level: Minimal Assistance - Patient > 75%     Upper Body Dressing/Undressing Upper body dressing   What is the patient wearing?: Pull over shirt    Upper body assist Assist Level: Moderate Assistance - Patient 50 - 74%    Lower Body Dressing/Undressing Lower body dressing      What is the patient wearing?: Pants     Lower body assist Assist for lower body dressing: Moderate Assistance - Patient 50 - 74%     Toileting Toileting    Toileting assist Assist for toileting: Minimal Assistance - Patient > 75%     Transfers Chair/bed transfer  Transfers assist  Chair/bed transfer activity did not occur: Safety/medical concerns  Chair/bed transfer assist level: Minimal Assistance - Patient > 75%     Locomotion Ambulation   Ambulation assist   Ambulation activity did not occur: Refused(pt refused gait w/o AD)  Assist level: Moderate Assistance - Patient 50 - 74% Assistive device: Hand held assist Max distance: 10'   Walk 10 feet activity   Assist  Walk 10 feet activity did not occur: Refused  Assist level: Moderate Assistance - Patient - 50 - 74% Assistive device: Hand held assist   Walk 50 feet activity  Assist Walk 50 feet with 2 turns activity did not occur: Refused  Assist level: Maximal Assistance - Patient 25 - 49% Assistive device: No Device    Walk 150 feet activity   Assist           Walk 10 feet on uneven surface  activity   Assist Walk 10 feet on uneven surfaces activity did not occur: Actor activity did not occur: N/A         Wheelchair 50 feet with 2 turns  activity    Assist    Wheelchair 50 feet with 2 turns activity did not occur: N/A       Wheelchair 150 feet activity     Assist  Wheelchair 150 feet activity did not occur: N/A       Blood pressure 120/70, pulse 80, temperature 98.6 F (37 C), temperature source Oral, resp. rate 16, height 5\' 7"  (1.702 m), weight 71.4 kg, SpO2 100 %.  Medical Problem List and Plan: 1.Functional and cognitive deficitssecondary to TBI with skull fracture  Continue CIR  -RLAS V 2. Antithrombotics: -DVT/anticoagulation:Pharmaceutical:Lovenox -antiplatelet therapy: N/a 3. Pain Management:Ultram and/or oxycodone prn.    -continue scheduled tylenol qid for now.Well controlled.  4. Mood:LCSW to follow for evaluation and support when appropriate. -antipsychotic agents: N/A 5. Neuropsych: This patientis notcapable of making decisions on hisown behalf.  Still requires enclosure bed still for safety  RLAS V  6. Skin/Wound Care:routine pressure relief measures. 7. Fluids/Electrolytes/Nutrition:encourage PO 8. Agitation: At risk for falls.    - continue on hs klonopin 0.25mg  and seroquel 25mg  AM, nightly dose increased to 37.5   quite/controlled environment   -continue propranolol-seems to have helped  -sleep chart ongoing 9. Hyperglycemia: Likely due to tube feeds. Hgb A1C 5.2  10. History of polysubstance abuse: Continue thiamine and folic acid.  11. Dysphagia: D2 thins for now  Eating well.    -recent labs acceptable   13.  Hyperglycemia-secondary to tube feeds  Sugars normal. Dc'ed cbg checks 14.  Hyponatremia  Sodium 133 on 4/30, up to 136 5/3, up to 138 on 5/10 LOS: 11 days A FACE TO FACE EVALUATION WAS PERFORMED  5/30 Wilmore Holsomback 08/29/2019, 11:31 AM

## 2019-08-29 NOTE — Progress Notes (Signed)
Occupational Therapy Session Note  Patient Details  Name: Jeremiah West MRN: [id number] Date of Birth: [birth year]  Today's Date: 08/29/2019 OT Individual Time: 0354-6568 OT Individual Time Calculation (min): 23 min  and Today's Date: 08/29/2019 OT Missed Time: 20 Minutes Missed Time Reason: Patient fatigue   Short Term Goals: Week 2:  OT Short Term Goal 1 (Week 2): Pt will terminate bathing with min VC OT Short Term Goal 2 (Week 2): Pt will don B socks iwht MIN A OT Short Term Goal 3 (Week 2): Pt will don pants with MIN A for standing balnace only OT Short Term Goal 4 (Week 2): Pt will orient clothing to self with min cuing  Skilled Therapeutic Interventions/Progress Updates:    Upon entering the room, pt supine in enclosure bed with covers pulled over head. Pt had been incontinent of urine and was unaware. OT showing pt the wet blanket, pad, and brief. Pt agreeable to change these items but declined exiting the bed during this session. Pt rolling L <> R with supervision and min cuing while therapist provided assistance with hygiene and clothing management. Pt continued to refuse OOB activities and reports feeling tired. Pt needing total A for orientation this session and when asked questions he mostly just mumbles and does not answer. Pt pulling clean blanket back over eyes and rolling over. Therapist securing enclosure bed. All needs within reach. 20 missed minutes this session.    Therapy Documentation Precautions:  Precautions Precautions: Fall Precaution Comments: Soft waist Posey belt restraint, B wrist restraints Restrictions Weight Bearing Restrictions: No General: General OT Amount of Missed Time: 20 Minutes Vital Signs: Therapy Vitals Pulse Rate: 80 BP: 120/70 Pain:   ADL: ADL Grooming: Moderate assistance Where Assessed-Grooming: Standing at sink Upper Body Bathing: Maximal assistance Where Assessed-Upper Body Bathing: Sitting at sink Lower Body Bathing: Maximal  assistance Where Assessed-Lower Body Bathing: Sitting at sink Upper Body Dressing: Moderate assistance Where Assessed-Upper Body Dressing: Sitting at sink Lower Body Dressing: Maximal assistance Where Assessed-Lower Body Dressing: Sitting at sink, Standing at sink Toilet Transfer: Minimal assistance Toilet Transfer Method: Stand pivot Toilet Transfer Equipment: Grab bars   Therapy/Group: Individual Therapy  Alen Bleacher 08/29/2019, 10:09 AM

## 2019-08-29 NOTE — Progress Notes (Signed)
Social Work Patient ID: Jeremiah West, male   DOB: [birth year], 69 y.o.   MRN: [id number]   SW spoke with Jeremiah West/[facility] [phone]) who completed phone assessment with pt via telephone (SW assisted).    SW followed up with pt sister Jeremiah West [phone]) to confirm if the plan is still placement after speaking with the his children. States there is not any family able to assist at this time. SW informed there will be follow-up with more updates on placement location as SW will plan for a long term care bed. SW also revisited applying for guardianship if family believes they will not be able to assist him going forward. SW will continue to provide updates.   Cecile Sheerer, MSW, LCSWA Office: [phone] Cell: [phone] Fax: [phone]

## 2019-08-29 NOTE — Progress Notes (Signed)
Pt has refused all afternoon scheduled meds. Pt rolled over in bed and refused medications and meal supplements. Pt was educated on the importance of taking his medications.

## 2019-08-29 NOTE — Progress Notes (Signed)
Physical Therapy Session Note  Patient Details  Name: Jeremiah West MRN: [id number] Date of Birth: 09-12-1950  Today's Date: 08/29/2019 PT Individual Time: 1100-1124 and 1400- 1458 PT Individual Time Calculation (min): 24 min and 58 min    and Today's Date: 08/29/2019 PT Missed Time: 36 Minutes Missed Time Reason: Patient unwilling to participate  Short Term Goals: Week 2:  PT Short Term Goal 1 (Week 2): Pt will initiate stair training PT Short Term Goal 2 (Week 2): Pt will ambulate 64' w/ mod assist consistently using LRAD PT Short Term Goal 3 (Week 2): Pt will initiate functional tasks w/ only min cues 100% of the time PT Short Term Goal 4 (Week 2): Pt will perform bed<>chair transfer w/ CGA  Skilled Therapeutic Interventions/Progress Updates:    Session 1: Pt supine in enclosure bed upon PT arrival, pt denies OOB activity but participates in therapeutic conversation with therapist. Pt reports he already used the bathroom and just wants to "lay here." Therapist noted that patients pants were not fully donned, pt agreeable to fix brief/pants. Pt performed x 2 bridges this session while therapist assisted to pull pants/briefs over hips. Pt then rolls onto his side with supervision, and continues to decline getting OOB despite max encouragement from therapist. Pt able to accurately reports time of day. Therapist explains that this therapist will be back in the afternoon, pt reports he will get up then. When given two options pt able to participate and report his favorite item. He also answers a few questions about his life then reports "I want to be left alone." Pt missed 36 minutes of skilled therapy tx this session secondary to unwilling to participate.    Session 2: Pt received from ST this session while seated in his w/c, agreeable to therapy tx and denies pain. Pt transported to the gym. Pt ambulated 2 x 20 ft with RW and min assist, cues for RW management and step length, pt keeps wide  BOS. Pt seated in chair worked on balance and attention to task hitting beach ball back and forth with 3# dowel, x 3 trials. Pt worked on standing balance this session with intermittent UE support on RW while performing horseshoe toss activity x 2 trials, min guard assist. Pt transported to the ortho gym in w/c. Pt used dynavision this session x 1 trial sitting and x 2 trials while standing without AD, cues for attention to task to keep hitting the red lights. Pt transported to the dayroom. Pt ambulated 2 x 30 ft with RW and min assist working on gait, cues for [employer] and safety. Pt seated in w/c used kinetron for LE strengthening 2 x 1 min bouts on 40 cm/sec resistance. Pt worked on cognitive remediation to perform card matching activity, cues for completion of task. Pt ambulated from w/c<>rehab apartment recliner 2 x 10 ft working on navigation in home environment, increased cues for RW management to avoid hitting obstacles. Pt transported back to room, stand pivot to toilet, assist with clothing management, noted already incontinent with now episode of bladder continence. Pt transferred to bed and left supine with needs in reach and in enclosure bed.      Therapy Documentation Precautions:  Precautions Precautions: Fall Precaution Comments: Soft waist Posey belt restraint, B wrist restraints Restrictions Weight Bearing Restrictions: No   Therapy/Group: Individual Therapy  Cresenciano Genre, PT, DPT, CSRS 08/29/2019, 7:53 AM

## 2019-08-29 NOTE — Progress Notes (Signed)
Speech Language Pathology Daily Session Note  Patient Details  Name: ASA BAUDOIN MRN: [id number] Date of Birth: 1951-01-08  Today's Date: 08/29/2019 SLP Individual Time: 1300-1355 SLP Individual Time Calculation (min): 55 min  Short Term Goals: Week 2: SLP Short Term Goal 1 (Week 2): Patient will demonstrate sustained attention to functional tasks for 15 minutes with Mod verbal cues for redirection. SLP Short Term Goal 2 (Week 2): Patient will demonstrate orientation X 4 with Max A multimodal cues. SLP Short Term Goal 3 (Week 2): Patient will demonstrate basic problem solving for basic and familiar tasks with Mod A verbal and visual cues. SLP Short Term Goal 4 (Week 2): Patient will verbalize basic wants/needs at the phrase level with Min verbal cues. SLP Short Term Goal 5 (Week 2): Patient will consume current diet with minimal overt s/s of aspiration with Mod verbal cues use of swallowing compensatory strategies. SLP Short Term Goal 6 (Week 2): Patient will demonstrate efficient mastication and complete oral clerance with trials of Dys. 3 textures over 2 sessions without overt s/s of aspiration and Mod verbal cues.  Skilled Therapeutic Interventions: Skilled treatment session focused on cognitive goals. SLP facilitated session by providing set-up assist to complete basic ADL tasks at the sink. SLP also facilitated session by providing external aids to maximize recall of orientation to place, time and situation in which patient could use with supervision level verbal cues. Patient perseverative on finding his "wife" but was easily redirected. Patient handed off to PT. Continue with current plan of care.      Pain No/Denies Pain   Therapy/Group: Individual Therapy  Zeniyah Peaster 08/29/2019, 3:08 PM

## 2019-08-30 ENCOUNTER — Inpatient Hospital Stay (HOSPITAL_COMMUNITY): Payer: Medicare Other | Admitting: Physical Therapy

## 2019-08-30 ENCOUNTER — Inpatient Hospital Stay (HOSPITAL_COMMUNITY): Payer: Medicare Other

## 2019-08-30 ENCOUNTER — Inpatient Hospital Stay (HOSPITAL_COMMUNITY): Payer: Medicare Other | Admitting: Speech Pathology

## 2019-08-30 NOTE — Progress Notes (Signed)
[place] PHYSICAL MEDICINE & REHABILITATION PROGRESS NOTE   Subjective/Complaints: Slept last night. Lying in bed wanting to eat breakfast.   ROS: Limited due to cognitive/behavioral    Objective:   No results found. Recent Labs    08/29/19 0615  WBC 3.4*  HGB 13.7  HCT 41.1  PLT 264   Recent Labs    08/29/19 0615  NA 138  K 4.3  CL 101  CO2 27  GLUCOSE 105*  BUN 14  CREATININE 1.19  CALCIUM 9.5    Intake/Output Summary (Last 24 hours) at 08/30/2019 1104 Last data filed at 08/30/2019 0855 Gross per 24 hour  Intake 478 ml  Output --  Net 478 ml     Physical Exam: Vital Signs Blood pressure 116/68, pulse 76, temperature 98.9 F (37.2 C), resp. rate 14, height 5\' 7"  (1.702 m), weight 71.4 kg, SpO2 98 %.  Constitutional: No distress . Vital signs reviewed. HEENT: EOMI, oral membranes moist Neck: supple Cardiovascular: RRR without murmur. No JVD    Respiratory/Chest: CTA Bilaterally without wheezes or rales. Normal effort    GI/Abdomen: BS +, non-tender, non-distended Ext: no clubbing, cyanosis, or edema Psych: distracted, confused.  Skin: Warm and dry.  Intact. Musc: No edema in extremities.  No tenderness in extremities. Neuro: aroused easily.  Responds inappropriately to my questions. Does follow simple one step commands.  Motor: moves all 4's spontaneously, senses pain  Assessment/Plan: 1. Functional deficits secondary to TBI which require 3+ hours per day of interdisciplinary therapy in a comprehensive inpatient rehab setting.  Physiatrist is providing close team supervision and 24 hour management of active medical problems listed below.  Physiatrist and rehab team continue to assess barriers to discharge/monitor patient progress toward functional and medical goals  Care Tool:  Bathing    Body parts bathed by patient: Right arm, Left arm, Chest, Abdomen, Front perineal area, Right upper leg, Left upper leg, Face, Right lower leg, Left lower leg    Body parts bathed by helper: Buttocks     Bathing assist Assist Level: Minimal Assistance - Patient > 75%     Upper Body Dressing/Undressing Upper body dressing   What is the patient wearing?: Pull over shirt    Upper body assist Assist Level: Moderate Assistance - Patient 50 - 74%    Lower Body Dressing/Undressing Lower body dressing      What is the patient wearing?: Pants     Lower body assist Assist for lower body dressing: Moderate Assistance - Patient 50 - 74%     Toileting Toileting    Toileting assist Assist for toileting: Minimal Assistance - Patient > 75%     Transfers Chair/bed transfer  Transfers assist  Chair/bed transfer activity did not occur: Safety/medical concerns  Chair/bed transfer assist level: Minimal Assistance - Patient > 75%     Locomotion Ambulation   Ambulation assist   Ambulation activity did not occur: Refused(pt refused gait w/o AD)  Assist level: Moderate Assistance - Patient 50 - 74% Assistive device: Hand held assist Max distance: 10'   Walk 10 feet activity   Assist  Walk 10 feet activity did not occur: Refused  Assist level: Moderate Assistance - Patient - 50 - 74% Assistive device: Hand held assist   Walk 50 feet activity   Assist Walk 50 feet with 2 turns activity did not occur: Refused  Assist level: Maximal Assistance - Patient 25 - 49% Assistive device: No Device    Walk 150 feet activity  Assist           Walk 10 feet on uneven surface  activity   Assist Walk 10 feet on uneven surfaces activity did not occur: Armed forces operational officer activity did not occur: N/A         Wheelchair 50 feet with 2 turns activity    Assist    Wheelchair 50 feet with 2 turns activity did not occur: N/A       Wheelchair 150 feet activity     Assist  Wheelchair 150 feet activity did not occur: N/A       Blood pressure 116/68, pulse 76, temperature  98.9 F (37.2 C), resp. rate 14, height 5\' 7"  (1.702 m), weight 71.4 kg, SpO2 98 %.  Medical Problem List and Plan: 1.Functional and cognitive deficitssecondary to TBI with skull fracture  Continue CIR  -RLAS V, slow progress likely linked to significant ETOH abuse 2. Antithrombotics: -DVT/anticoagulation:Pharmaceutical:Lovenox -antiplatelet therapy: N/a 3. Pain Management:Ultram and/or oxycodone prn.    -continue scheduled tylenol qid for now.Well controlled.  4. Mood:LCSW to follow for evaluation and support when appropriate. -antipsychotic agents: seroquel 5. Neuropsych: This patientis notcapable of making decisions on hisown behalf.  Will try low bed.  RLAS V   -dc klonopin  -continue low dose seroquel at bedtime for now 6. Skin/Wound Care:routine pressure relief measures. 7. Fluids/Electrolytes/Nutrition:encourage PO 8. Agitation: At risk for falls.    - continue on hs klonopin 0.25mg  and seroquel 25mg  AM, nightly dose increased to 37.5   quite/controlled environment   -continue propranolol-seems to have helped  -sleep chart ongoing 9. Hyperglycemia:  Resolved off tf  10. History of polysubstance abuse: Continue thiamine and folic acid.  11. Dysphagia: D2 thins for now  Eating well.    -recent labs acceptable   14.  Hyponatremia  Sodium 133 on 4/30, up to 136 5/3, up to 138 on 5/10 LOS: 12 days A FACE TO [place] 08/30/2019, 11:04 AM

## 2019-08-30 NOTE — Progress Notes (Signed)
Occupational Therapy Session Note  Patient Details  Name: Jeremiah West MRN: [id number] Date of Birth: 02-Dec-1950  Today's Date: 08/30/2019 OT Individual Time: 2778-2423 OT Individual Time Calculation (min): 55 min    Short Term Goals: Week 2:  OT Short Term Goal 1 (Week 2): Pt will terminate bathing with min VC OT Short Term Goal 2 (Week 2): Pt will don B socks iwht MIN A OT Short Term Goal 3 (Week 2): Pt will don pants with MIN A for standing balnace only OT Short Term Goal 4 (Week 2): Pt will orient clothing to self with min cuing  Skilled Therapeutic Interventions/Progress Updates:    Pt resting in w/c in room upon arrival.  [facility] alarm activated with Telesitter. Pt adamantly declined use of bathroom but agreeable to donning clothing. Pt presented with disposable scrubs.  Pt donned shirt backwards and unaware until therapist told him.  Pt removed shirt and donned correctly.  Pt required assistance threading RLE into pants.  CGA for sit<>stand and standing balance to pull pants over hips.  Pt transitioned to gym for table tasks.  Pt presented with checker board and checkers.  Pt commented that he like to play checkers.  Pt challenged with placing checkers on board appropriately to play a game.  Pt initally placed 80% of black checkers on left side board and then began to randomly move checkers to open squares. Pt made not attempt to place red checkers.  Pt engaged in color sorting task with colored blocks.  Pt required max A to sort blocks correctly.  Pt engaged in colored peg task.  Pt challenged with placing all blue pegs in a row on board.  Pt verbalized understanding but then commenced to randomly remove pegs from container and place in random order on board.  Pt required max verbal cues to follow command. Pt remained in w/c with belt alarm activated at nursing station.  Coloring sheets and colored pencils placed on table.   Therapy Documentation Precautions:  Precautions Precautions:  Fall Precaution Comments: Soft waist Posey belt restraint, B wrist restraints Restrictions Weight Bearing Restrictions: No   Pain:  Pt denies pain this morning   Therapy/Group: Individual Therapy  Rich Brave 08/30/2019, 10:04 AM
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
## 2019-08-30 NOTE — Plan of Care (Signed)
  Problem: RH Swallowing Goal: LTG Patient will consume least restrictive diet using compensatory strategies with assistance (SLP) Description: LTG:  Patient will consume least restrictive diet using compensatory strategies with assistance (SLP) Flowsheets (Taken 08/30/2019 0624) LTG: Pt Patient will consume least restrictive diet using compensatory strategies with assistance of (SLP): Minimal Assistance - Patient > 75% Note: Downgraded due to lack of progress   Problem: RH Cognition - SLP Goal: RH LTG Patient will demonstrate orientation with cues Description:  LTG:  Patient will demonstrate orientation to person/place/time/situation with cues (SLP)   Flowsheets (Taken 08/30/2019 0624) LTG: Patient will demonstrate orientation using cueing (SLP): Moderate Assistance - Patient 50 - 74% Note: Downgraded due to lack of progress   Problem: RH Awareness Goal: LTG: Patient will demonstrate awareness during functional activites type of (SLP) Description: LTG: Patient will demonstrate awareness during functional activites type of (SLP) Flowsheets (Taken 08/30/2019 0624) LTG: Patient will demonstrate awareness during cognitive/linguistic activities with assistance of (SLP): Moderate Assistance - Patient 50 - 74% Note: Downgraded due to lack of progress

## 2019-08-30 NOTE — Progress Notes (Signed)
Speech Language Pathology Daily Session Note  Patient Details  Name: Jeremiah West MRN: [id number] Date of Birth: 1950-08-29  Today's Date: 08/30/2019 SLP Individual Time: 1330-1350 SLP Individual Time Calculation (min): 20 min and Today's Date: 08/30/2019 SLP Missed Time: 25 Minutes Missed Time Reason: Patient fatigue  Short Term Goals: Week 2: SLP Short Term Goal 1 (Week 2): Patient will demonstrate sustained attention to functional tasks for 15 minutes with Mod verbal cues for redirection. SLP Short Term Goal 2 (Week 2): Patient will demonstrate orientation X 4 with Max A multimodal cues. SLP Short Term Goal 3 (Week 2): Patient will demonstrate basic problem solving for basic and familiar tasks with Mod A verbal and visual cues. SLP Short Term Goal 4 (Week 2): Patient will verbalize basic wants/needs at the phrase level with Min verbal cues. SLP Short Term Goal 5 (Week 2): Patient will consume current diet with minimal overt s/s of aspiration with Mod verbal cues use of swallowing compensatory strategies. SLP Short Term Goal 6 (Week 2): Patient will demonstrate efficient mastication and complete oral clerance with trials of Dys. 3 textures over 2 sessions without overt s/s of aspiration and Mod verbal cues.  Skilled Therapeutic Interventions: Skilled treatment session focused on cognitive goals. Upon arrival, patient was awake but supine in the high-low bed. Patient declined participation despite multiple attempts and max encouragement. Patient's bed alarm is currently not working, Charity fundraiser, Chemical engineer made aware. Patient left supine in bed with alarm on and all needs within reach. Continue with current plan of care.      Pain    Therapy/Group: Individual Therapy  Catheryne Deford 08/30/2019, 1:54 PM

## 2019-08-30 NOTE — Progress Notes (Signed)
Physical Therapy Session Note  Patient Details  Name: Jeremiah West MRN: [id number] Date of Birth: 1950-04-24  Today's Date: 08/30/2019 PT Individual Time: 1140-1205 AND 1500-1525 PT Individual Time Calculation (min): 25 min AND 25 min  Short Term Goals: Week 2:  PT Short Term Goal 1 (Week 2): Pt will initiate stair training PT Short Term Goal 2 (Week 2): Pt will ambulate 83' w/ mod assist consistently using LRAD PT Short Term Goal 3 (Week 2): Pt will initiate functional tasks w/ only min cues 100% of the time PT Short Term Goal 4 (Week 2): Pt will perform bed<>chair transfer w/ CGA  Skilled Therapeutic Interventions/Progress Updates:   Session 1:  Made multiple attempts to see pt for scheduled session at 11am. Pt adamently refusing any activity. Intercepted pt when he was up in chair w/ nursing staff after getting changed after soiling bed. Pt agreeable to therapy and no c/o pain. Total assist w/c transport to/from therapy gym for time management. Worked on Personnel officer w/ RW, ambulated 59' w/ RW and min assist overall. Very slow speed but improved initiation compared to previous sessions with this therapist. Verbal and tactile cues for upright posture and manual assist for RW management. Worked on standing tolerance while standing to high/low table to perform simple peg board task. Max multimodal cues to reach full upright standing, however pt remained in flexed trunk posture. Max verbal and tactile cues for sequencing and problem solving of peg board. Returned to sitting after 2-3 min and attempted to complete task in sitting. Pt continued to require max multimodal cues to problem solve and attend to task, however slightly improved compared to standing. Returned to room via w/c. Switched pt's bed to high/low bed against wall to limit pt's ability to get OOB w/o assist. Pt requesting to remain in w/c. Ended session in w/c, all needs in reach. Safety alarm belt engaged. Missed 35 min of skilled  PT 2/2 refusal.   Session 2: Pt in supine and in care of NT, pt w/ increased restlessness this afternoon. No c/o pain throughout session and he was agreeable to get OOB. Supervision bed mobility and min assist stand pivot to w/c. Pt perseverating on calling his girlfriend, Jeremiah West. Pt also perseverating on his bed being changed from enclosure bed to a standard bed. He did not verbalize this directly but kept asking where was his bed. Pt reported "this is all confusing". Session focused on reorientation and decreasing agitation. Total assist w/c transport out of room and to/from nurses station, this appeared to increase pt's confusion despite reassurance from therapist. Returned to room and assisted pt w/ dialing his girlfriend multiple times. Pt w/ increased agitation/frustration as he was slamming the phone down and yelling her name. She did not answer but encouraged pt to leave a message, which he did. Pt then insisting on calling her until she answered, therapist repeatedly reminded him he left a message and she would call back when she got the message. Pt then stating "I'm leaving" and tried to get up from w/c. When asked where he was going he did not answer. Pt agreeable to sit up in w/c at nurses station w/ encouragement (vs getting up from chair and walking out of unit). He does not appear to be at risk of leaving the unit 2/2 inability to ambulate unassisted and impaired motor planning. Pt remained in w/c at nurses station, under direct supervision of RN and all needs met.   Therapy Documentation Precautions:  Precautions Precautions:  Fall Precaution Comments: Soft waist Posey belt restraint, B wrist restraints Restrictions Weight Bearing Restrictions: No  Therapy/Group: Individual Therapy  Jeremiah West Jeremiah West 08/30/2019, 12:09 PM

## 2019-08-30 NOTE — Progress Notes (Signed)
Patient resting and is calm, enclosure bed discontinued and telesitter remains. No complications noted at this time.  Jay Schlichter, LPN

## 2019-08-30 NOTE — Progress Notes (Addendum)
Social Work Patient ID: Lynden Oxford, male   DOB: [birth year], 69 y.o.   MRN: [id number]   SW followed up with pt sister [name] [phone]) to provide updates from team conference. SW reiterated reviewing SNF referral list provided and to follow-up with SW. SW also dicussed again considering applying for guardianship.   SW sent out SNF referral. Waiting on PASRR#.   Cecile Sheerer, MSW, LCSWA Office: [phone] Cell: [phone] Fax: [phone]

## 2019-08-30 NOTE — Progress Notes (Addendum)
Patient been out of enclosure bed for approx. 5 and a half hours. Patient has continued to be impulsive and will not adhere to safety precautions. Patient keeps trying to get out of low bed and moving to edge of bed. From 10:30 to around 4pm, patient has been at the nurses station or with therapy being belligerent and swinging at staff. Attempts were made to go back to bed he seemed confused because enclosure bed was not in place. High-low bed seems to confuse patient and he thinks it isn't his room. Patient is at nurses station at this time and still being impulsive and trying to take off safety belt.    Jay Schlichter, LPN    Reported off to night shift about previous behaviors of patient and the attempt to discontinue enclosure bed for less restrictive restraint. Patient was more calm and in bed at shift change. Night shift nurse and NT aware and will continue to monitor patient behavior.  Jay Schlichter, LPN

## 2019-08-31 ENCOUNTER — Inpatient Hospital Stay (HOSPITAL_COMMUNITY): Payer: Medicare Other

## 2019-08-31 ENCOUNTER — Inpatient Hospital Stay (HOSPITAL_COMMUNITY): Payer: Medicare Other | Admitting: Speech Pathology

## 2019-08-31 ENCOUNTER — Inpatient Hospital Stay (HOSPITAL_COMMUNITY): Payer: Medicare Other | Admitting: Physical Therapy

## 2019-08-31 NOTE — Progress Notes (Signed)
[facility] PHYSICAL MEDICINE & REHABILITATION PROGRESS NOTE   Subjective/Complaints: No complaints this morning. Participatory in exam but with difficulty following some basic commands.  AOx1  ROS: Limited due to cognitive/behavioral    Objective:   No results found. Recent Labs    08/29/19 0615  WBC 3.4*  HGB 13.7  HCT 41.1  PLT 264   Recent Labs    08/29/19 0615  NA 138  K 4.3  CL 101  CO2 27  GLUCOSE 105*  BUN 14  CREATININE 1.19  CALCIUM 9.5    Intake/Output Summary (Last 24 hours) at 08/31/2019 6283 Last data filed at 08/30/2019 1200 Gross per 24 hour  Intake 118 ml  Output --  Net 118 ml     Physical Exam: Vital Signs Blood pressure 105/73, pulse 73, temperature 98.2 F (36.8 C), temperature source Oral, resp. rate 14, height 5\' 7"  (1.702 m), weight 75.8 kg, SpO2 99 %.  Constitutional: No distress . Vital signs reviewed. HEENT: EOMI, oral membranes moist Neck: supple Cardiovascular: RRR without murmur. No JVD    Respiratory/Chest: CTA Bilaterally without wheezes or rales. Normal effort    GI/Abdomen: BS +, non-tender, non-distended Ext: no clubbing, cyanosis, or edema Psych: distracted, confused.  Skin: Warm and dry.  Intact. Musc: No edema in extremities.  No tenderness in extremities. Neuro: aroused easily. Participatory in exam but with difficulty following some basic commands.  AOx1 Motor: moves all 4's spontaneously, senses pain  Assessment/Plan: 1. Functional deficits secondary to TBI which require 3+ hours per day of interdisciplinary therapy in a comprehensive inpatient rehab setting.  Physiatrist is providing close team supervision and 24 hour management of active medical problems listed below.  Physiatrist and rehab team continue to assess barriers to discharge/monitor patient progress toward functional and medical goals  Care Tool:  Bathing    Body parts bathed by patient: Right arm, Left arm, Chest, Abdomen, Front perineal area,  Right upper leg, Left upper leg, Face, Right lower leg, Left lower leg   Body parts bathed by helper: Buttocks     Bathing assist Assist Level: Minimal Assistance - Patient > 75%     Upper Body Dressing/Undressing Upper body dressing   What is the patient wearing?: Pull over shirt    Upper body assist Assist Level: Moderate Assistance - Patient 50 - 74%    Lower Body Dressing/Undressing Lower body dressing      What is the patient wearing?: Pants     Lower body assist Assist for lower body dressing: Moderate Assistance - Patient 50 - 74%     Toileting Toileting    Toileting assist Assist for toileting: Minimal Assistance - Patient > 75%     Transfers Chair/bed transfer  Transfers assist  Chair/bed transfer activity did not occur: Safety/medical concerns  Chair/bed transfer assist level: Minimal Assistance - Patient > 75%     Locomotion Ambulation   Ambulation assist   Ambulation activity did not occur: Refused(pt refused gait w/o AD)  Assist level: Minimal Assistance - Patient > 75% Assistive device: Walker-rolling Max distance: 50'   Walk 10 feet activity   Assist  Walk 10 feet activity did not occur: Refused  Assist level: Minimal Assistance - Patient > 75% Assistive device: Walker-rolling   Walk 50 feet activity   Assist Walk 50 feet with 2 turns activity did not occur: Refused  Assist level: Minimal Assistance - Patient > 75% Assistive device: Walker-rolling    Walk 150 feet activity   Assist  Walk 10 feet on uneven surface  activity   Assist Walk 10 feet on uneven surfaces activity did not occur: Actor activity did not occur: N/A         Wheelchair 50 feet with 2 turns activity    Assist    Wheelchair 50 feet with 2 turns activity did not occur: N/A       Wheelchair 150 feet activity     Assist  Wheelchair 150 feet activity did not occur:  N/A       Blood pressure 105/73, pulse 73, temperature 98.2 F (36.8 C), temperature source Oral, resp. rate 14, height 5\' 7"  (1.702 m), weight 75.8 kg, SpO2 99 %.  Medical Problem List and Plan: 1.Functional and cognitive deficitssecondary to TBI with skull fracture  Continue CIR  -RLAS V, slow progress likely linked to significant ETOH abuse 2. Antithrombotics: -DVT/anticoagulation:Pharmaceutical:Lovenox -antiplatelet therapy: N/a 3. Pain Management:Ultram and/or oxycodone prn.    -continue scheduled tylenol qid for now.Well controlled.  4. Mood:LCSW to follow for evaluation and support when appropriate. -antipsychotic agents: seroquel 5. Neuropsych: This patientis notcapable of making decisions on hisown behalf.  Will try low bed, appears to be doing well with this.   RLAS V   -dc klonopin  -continue low dose seroquel at bedtime for now 6. Skin/Wound Care:routine pressure relief measures. 7. Fluids/Electrolytes/Nutrition:encourage PO 8. Agitation: At risk for falls.    - continue on hs klonopin 0.25mg  and seroquel 25mg  AM, nightly dose increased to 37.5   quite/controlled environment   -continue propranolol-seems to have helped  -sleep chart ongoing 9. Hyperglycemia:  Resolved off tf  10. History of polysubstance abuse: Continue thiamine and folic acid.  11. Dysphagia: D2 thins for now  Eating well   -recent labs acceptable   14.  Hyponatremia  Sodium 133 on 4/30, up to 136 5/3, up to 138 on 5/10 15. Disposition: SW sending SNF referrals  LOS: 13 days A FACE TO FACE EVALUATION WAS PERFORMED  5/30 Jeremiah West 08/31/2019, 9:38 AM

## 2019-08-31 NOTE — Progress Notes (Signed)
Physical Therapy Session Note  Patient Details  Name: GORO WENRICK MRN: [id number] Date of Birth: 01/09/1951  Today's Date: 08/31/2019 PT Missed Time: 30 Minutes Missed Time Reason: Patient unwilling to participate  Therapist made 3 attempts to see pt for therapy session. Pt adamantly refused each time. Pt in supine and refusing as he wanted to sleep. Gave multiple options for activities including just getting up to chair/recliner or going outside. Pt continued to refused.   Pt's frequency downgraded to 1x/day of each discipline 2/2 poor participation.   Yvonda Fouty Melton Krebs 08/31/2019, 2:32 PM

## 2019-08-31 NOTE — Plan of Care (Signed)
  Problem: RH SAFETY Goal: RH STG ADHERE TO SAFETY PRECAUTIONS W/ASSISTANCE/DEVICE Description: STG Adhere to Safety Precautions With min/mod Assistance/Device. Outcome: Progressing   Problem: RH COGNITION-NURSING Goal: RH STG USES MEMORY AIDS/STRATEGIES W/ASSIST TO PROBLEM SOLVE Description: STG Uses Memory Aids/Strategies With min/mod Assistance to Problem Solve. Outcome: Progressing Goal: RH STG ANTICIPATES NEEDS/CALLS FOR ASSIST W/ASSIST/CUES Description: STG Anticipates Needs/Calls for Assist With min/mod Assistance/Cues. Outcome: Progressing

## 2019-08-31 NOTE — Plan of Care (Signed)
  Problem: Consults Goal: RH BRAIN INJURY PATIENT EDUCATION Description: Description: See Patient Education module for eduction specifics Outcome: Progressing Goal: Nutrition Consult-if indicated Outcome: Progressing Goal: Diabetes Guidelines if Diabetic/Glucose > 140 Description: If diabetic or lab glucose is > 140 mg/dl - Initiate Diabetes/Hyperglycemia Guidelines & Document Interventions  Outcome: Progressing   Problem: RH BOWEL ELIMINATION Goal: RH STG MANAGE BOWEL WITH ASSISTANCE Description: STG Manage Bowel with min Assistance. Outcome: Progressing   Problem: RH BLADDER ELIMINATION Goal: RH STG MANAGE BLADDER WITH ASSISTANCE Description: STG Manage Bladder With min Assistance Outcome: Progressing   Problem: RH SAFETY Goal: RH STG ADHERE TO SAFETY PRECAUTIONS W/ASSISTANCE/DEVICE Description: STG Adhere to Safety Precautions With min/mod Assistance/Device. Outcome: Progressing   Problem: RH COGNITION-NURSING Goal: RH STG USES MEMORY AIDS/STRATEGIES W/ASSIST TO PROBLEM SOLVE Description: STG Uses Memory Aids/Strategies With min/mod Assistance to Problem Solve. Outcome: Progressing Goal: RH STG ANTICIPATES NEEDS/CALLS FOR ASSIST W/ASSIST/CUES Description: STG Anticipates Needs/Calls for Assist With min/mod Assistance/Cues. Outcome: Progressing   Problem: RH KNOWLEDGE DEFICIT BRAIN INJURY Goal: RH STG INCREASE KNOWLEDGE OF SELF CARE AFTER BRAIN INJURY Description: With min/mod assist Outcome: Progressing Goal: RH STG INCREASE KNOWLEDGE OF DYSPHAGIA/FLUID INTAKE Description: With min/mod assist Outcome: Progressing   

## 2019-08-31 NOTE — Progress Notes (Signed)
Occupational Therapy Session Note  Patient Details  Name: Jeremiah West MRN: [id number] Date of Birth: December 09, 1950  Today's Date: 08/31/2019 OT Individual Time: 2423-5361 OT Individual Time Calculation (min): 29 min  and Today's Date: 08/31/2019 OT Missed Time: 31 Minutes Missed Time Reason: Patient unwilling/refused to participate without medical reason   Short Term Goals: Week 2:  OT Short Term Goal 1 (Week 2): Pt will terminate bathing with min VC OT Short Term Goal 2 (Week 2): Pt will don B socks iwht MIN A OT Short Term Goal 3 (Week 2): Pt will don pants with MIN A for standing balnace only OT Short Term Goal 4 (Week 2): Pt will orient clothing to self with min cuing Week 3:     Skilled Therapeutic Interventions/Progress Updates:    1;1. Pt received in bed with no pain. Pt declines bathing intervention, however agreeable to dressing. Pt requires increased time and providing OT hand for pt to initiate coming to EOB. Pt completes seated EOB dressing with set up for pull over shirt and VC for orientation of pants to self as pt initially threading backwards with no awareness. Pt able to sit to stand with CGA to pull pants past hips. Pt able to don socks iwht increased time and set up today at EOB. Upon finishing pt declining adamantly any more tx stating, "im not getting in the chair and doing therapy." Pt given 5 min break to lay down to decompress from stimuli and OT attempted to engage in tx again. Pt continues to be unwilling to get OOB or EOB for tx. Pt engaged in orientation conversation and able to state he is at the hospital and is oriented to self. Pt not oriented to time or situation despite signs on wall for external cues. Pt unwilling to roll over to look at signs. Pt continues to refuse and misses 31 min skilled OT. Pt left in bed, exit alarm on and call light in reach  Therapy Documentation Precautions:  Precautions Precautions: Fall Precaution Comments: Soft waist Posey belt  restraint, B wrist restraints Restrictions Weight Bearing Restrictions: No General:   Vital Signs: Therapy Vitals Temp: 98.2 F (36.8 C) Temp Source: Oral Pulse Rate: 73 BP: 105/73 Patient Position (if appropriate): Lying Oxygen Therapy SpO2: 99 % O2 Device: Room Air Pain:   ADL: ADL Grooming: Moderate assistance Where Assessed-Grooming: Standing at sink Upper Body Bathing: Maximal assistance Where Assessed-Upper Body Bathing: Sitting at sink Lower Body Bathing: Maximal assistance Where Assessed-Lower Body Bathing: Sitting at sink Upper Body Dressing: Moderate assistance Where Assessed-Upper Body Dressing: Sitting at sink Lower Body Dressing: Maximal assistance Where Assessed-Lower Body Dressing: Sitting at sink, Standing at sink Toilet Transfer: Minimal assistance Toilet Transfer Method: Stand pivot Toilet Transfer Equipment: Grab bars Vision   Perception    Praxis   Exercises:   Other Treatments:     Therapy/Group: Individual Therapy  Shon Hale 08/31/2019, 9:19 AM

## 2019-08-31 NOTE — Progress Notes (Signed)
Nutrition Follow-up  DOCUMENTATION CODES:   Non-severe (moderate) malnutrition in context of chronic illness  INTERVENTION:   - Continue MVI with minerals daily  -Continue Ensure Enlive poTID, each supplement provides 350 kcal and 20 grams of protein  NUTRITION DIAGNOSIS:   Moderate Malnutrition related to chronic illness (chronic medical issues and/or ETOH abuse) as evidenced by mild fat depletion, moderate muscle depletion.  Ongoing, being addressed via supplements  GOAL:   Patient will meet greater than or equal to 90% of their needs  Progressing  MONITOR:   PO intake, Supplement acceptance, Labs, Weight trends  REASON FOR ASSESSMENT:   Consult Enteral/tube feeding initiation and management, Calorie Count  ASSESSMENT:   69 year old male with PMH of depression, T2DM, polysubstance abuse, daily EtOH use. Pt was admitted after being found at the base of 16 stairs with slurred speech. Pt was found to have left frontal and temporal SDH, small left temporal SAH, nondisplaced fracture of right temporal calvarium extending to right mastoid and right mastoid effusion with hemorrhage. Other incidental findings include moderate right and small left hydrocele, contusion right hip, emphysema, multiple remote bilateral rib fractures, volume overload with interstitial edema, and calcified pleural plaques compatible with prior asbestos exposure. Pt is tolerating POs but tube feeds remain in place. Pt has had issues with fluctuating mental status with agitation and confusion requiring restraints. Admitted to CIR on 4/29.  5/02 - Cortrak removed  Spoke with pt at bedside. Pt reports that he is eating well and drinking the Ensure supplements. Will continue with current plan of care.  Weight trending up. Pt with moderate pitting edema to RUE.  Meal Completion: 75-100% x last 8 meals  Medications reviewed and include: colace, Ensure Enlive TID, folic acid, MVI with minerals, miralax,  thiamine  Labs reviewed.  Diet Order:   Diet Order            DIET DYS 2 Room service appropriate? Yes; Fluid consistency: Thin  Diet effective now              EDUCATION NEEDS:   Not appropriate for education at this time  Skin:  Skin Assessment: Reviewed RN Assessment  Last BM:  08/30/19  Height:   Ht Readings from Last 1 Encounters:  08/18/19 5\' 7"  (1.702 m)    Weight:   Wt Readings from Last 1 Encounters:  08/31/19 75.8 kg    Ideal Body Weight:  67.3 kg  BMI:  Body mass index is 26.16 kg/m.  Estimated Nutritional Needs:   Kcal:  2000-2200  Protein:  100-115 grams  Fluid:  >/= 2.0 L    10/31/19, MS, RD, LDN Inpatient Clinical Dietitian Pager: [phone] Weekend/After Hours: [phone]

## 2019-08-31 NOTE — Progress Notes (Signed)
Speech Language Pathology Daily Session Note  Patient Details  Name: Jeremiah West MRN: [id number] Date of Birth: 1950-07-16  Today's Date: 08/31/2019 SLP Individual Time: 1015-1055 SLP Individual Time Calculation (min): 40 min  Short Term Goals: Week 2: SLP Short Term Goal 1 (Week 2): Patient will demonstrate sustained attention to functional tasks for 15 minutes with Mod verbal cues for redirection. SLP Short Term Goal 2 (Week 2): Patient will demonstrate orientation X 4 with Max A multimodal cues. SLP Short Term Goal 3 (Week 2): Patient will demonstrate basic problem solving for basic and familiar tasks with Mod A verbal and visual cues. SLP Short Term Goal 4 (Week 2): Patient will verbalize basic wants/needs at the phrase level with Min verbal cues. SLP Short Term Goal 5 (Week 2): Patient will consume current diet with minimal overt s/s of aspiration with Mod verbal cues use of swallowing compensatory strategies. SLP Short Term Goal 6 (Week 2): Patient will demonstrate efficient mastication and complete oral clerance with trials of Dys. 3 textures over 2 sessions without overt s/s of aspiration and Mod verbal cues.  Skilled Therapeutic Interventions: Skilled treatment session focused on cognitive goals. Upon arrival, patient was awake in bed. Patient did not want to get out of bed but was participatory while sitting upright. SLP facilitated session by providing Mod-Max A verbal and visual cues for orientation to place and date with use of external aids. Patient also initially only required Mod verbal cues for sustained attention to a basic task, however, as task progressed Max verbal cues were needed. Mod verbal cues were also needed for recall throughout task. Patient left upright in bed with alarm on and all needs within reach. Continue with current plan of care.      Pain No/Denies Pain   Therapy/Group: Individual Therapy  Colen Eltzroth 08/31/2019, 3:37 PM

## 2019-08-31 NOTE — Progress Notes (Addendum)
Physical Therapy Session Note  Patient Details  Name: Jeremiah West MRN: [id number] Date of Birth: 1950-08-16  Today's Date: 08/31/2019 PT Individual Time: 1403-1505 PT Individual Time Calculation (min): 62 min   Short Term Goals: Week 2:  PT Short Term Goal 1 (Week 2): Pt will initiate stair training PT Short Term Goal 2 (Week 2): Pt will ambulate 10' w/ mod assist consistently using LRAD PT Short Term Goal 3 (Week 2): Pt will initiate functional tasks w/ only min cues 100% of the time PT Short Term Goal 4 (Week 2): Pt will perform bed<>chair transfer w/ CGA  Skilled Therapeutic Interventions/Progress Updates:     Patient in bed upon PT arrival. Patient initially declined getting OOB. He reported moderate low back pain, RN made aware and provided pain medicine during session. PT provided repositioning, rest breaks, and distraction as pain interventions throughout session. Focused session on management of low back pain and gait training.   Patient with delayed initiation, intermittent confusion, verbal perseveration, and mild agitation during session. Easily redirected and re-oriented and provided a quiet, low stimulating environment in his room throughout session.  Manual Therapy:  Performed soft tissue mobilization and trigger point release to lumbar para-spinal muscles, followed by manual assisted lumbar flexion stretch in supine with double knees to chest x1 min and manually assisted lower trunk rotation in supine. Patient reported reduced low back pain after.   Therapeutic Activity: Bed Mobility: Patient performed supine to/from sit with with supervision x3. Provided verbal cues for safety awareness. Transfers: Patient performed sit to/from stand x2 with min A using a RW. Provided verbal cues for hand placement and forward weight shift.   Gait Training:  Patient ambulated 30 feet x2 using RW with min A. Ambulated with decreased gait speed, decreased step length and height,  increased B hip and knee flexion in stance, forward trunk lean, and downward head gaze. Provided verbal cues for staying close to the RW for safety and manual facilitation for lateral weight shifts. Provided a seated and a lying rest break between ambulation trials. Patient expressed frustration about difficulty ambulating. Provided education about changes in motor control, mobility, and confusion following BI.   Patient requested to call his girlfriend after gait training. Assisted patient with dialing her number, patient read numbers off the white board and found each number on the phone. He became confused and perseverated on asking "are you going to pick up?" when she answered. Provided simple cues for redirection and patient's girlfriend stated that she would call him back.   Patient in bed at end of session with breaks locked, bed alarm set, and all needs within reach. Patient spontaneously got out of bed as PT was leaving, standing with min A without AD and became agitated by PT providing assist and perseverating on "I have to walk to the door." Patient easily redirected and ambulated to the bathroom with min A and cues for finding UE support on wall/furniture while ambulating for safety. Patient stood over the toilet to void with supervision for LB clothing management. RN entered room and provided patient with a RW and he ambulated back to bed, as described above. Patient returned to sitting EOB with RN in room at end of session.    Therapy Documentation Precautions:  Precautions Precautions: Fall Precaution Comments: Soft waist Posey belt restraint, B wrist restraints Restrictions Weight Bearing Restrictions: No    Therapy/Group: Individual Therapy  Paublo Warshawsky L Earvin Blazier PT, DPT  08/31/2019, 8:55 PM

## 2019-08-31 NOTE — Progress Notes (Addendum)
Social Work Patient ID: Lynden Oxford, male   DOB: Apr 20, 1951, 68 y.o.   MRN: [id number]    SW returned phone call to Godfrey with [facility] [phone]/Developmental Health and Disabilities Department) who reported she has questions. SW returned phone call several times but unable to leave message as voicemail is full. SW will continue to make attempts.  *receveied return phone from [place] who inquired if patient was in restraints. Sw provided updates on pt now in transitional bed and out of posey. SW informed pt has not been in mitten restraints since admission to CIR.   [facility] #: [id number] Anselm Jungling, MSW, LCSWA Office: [phone] Cell: [phone] Fax: [phone]

## 2019-09-01 ENCOUNTER — Inpatient Hospital Stay (HOSPITAL_COMMUNITY): Payer: Medicare Other | Admitting: Speech Pathology

## 2019-09-01 ENCOUNTER — Inpatient Hospital Stay (HOSPITAL_COMMUNITY): Payer: Medicare Other

## 2019-09-01 ENCOUNTER — Inpatient Hospital Stay (HOSPITAL_COMMUNITY): Payer: Medicare Other | Admitting: Physical Therapy

## 2019-09-01 MED ORDER — QUETIAPINE FUMARATE 25 MG PO TABS
25.0000 mg | ORAL_TABLET | Freq: Every day | ORAL | Status: DC
  Administered 2019-09-01 – 2019-09-04 (×4): 25 mg via ORAL
  Filled 2019-09-01 (×4): qty 1

## 2019-09-01 MED ORDER — QUETIAPINE FUMARATE 25 MG PO TABS
25.0000 mg | ORAL_TABLET | Freq: Once | ORAL | Status: AC
  Administered 2019-09-01: 25 mg via ORAL
  Filled 2019-09-01: qty 1

## 2019-09-01 NOTE — Plan of Care (Signed)
  Problem: Consults Goal: RH BRAIN INJURY PATIENT EDUCATION Description: Description: See Patient Education module for eduction specifics Outcome: Progressing Goal: Nutrition Consult-if indicated Outcome: Progressing Goal: Diabetes Guidelines if Diabetic/Glucose > 140 Description: If diabetic or lab glucose is > 140 mg/dl - Initiate Diabetes/Hyperglycemia Guidelines & Document Interventions  Outcome: Progressing   Problem: RH BOWEL ELIMINATION Goal: RH STG MANAGE BOWEL WITH ASSISTANCE Description: STG Manage Bowel with min Assistance. Outcome: Progressing   Problem: RH BLADDER ELIMINATION Goal: RH STG MANAGE BLADDER WITH ASSISTANCE Description: STG Manage Bladder With min Assistance Outcome: Progressing   Problem: RH SAFETY Goal: RH STG ADHERE TO SAFETY PRECAUTIONS W/ASSISTANCE/DEVICE Description: STG Adhere to Safety Precautions With min/mod Assistance/Device. Outcome: Progressing   Problem: RH COGNITION-NURSING Goal: RH STG USES MEMORY AIDS/STRATEGIES W/ASSIST TO PROBLEM SOLVE Description: STG Uses Memory Aids/Strategies With min/mod Assistance to Problem Solve. Outcome: Progressing Goal: RH STG ANTICIPATES NEEDS/CALLS FOR ASSIST W/ASSIST/CUES Description: STG Anticipates Needs/Calls for Assist With min/mod Assistance/Cues. Outcome: Progressing   Problem: RH KNOWLEDGE DEFICIT BRAIN INJURY Goal: RH STG INCREASE KNOWLEDGE OF SELF CARE AFTER BRAIN INJURY Description: With min/mod assist Outcome: Progressing Goal: RH STG INCREASE KNOWLEDGE OF DYSPHAGIA/FLUID INTAKE Description: With min/mod assist Outcome: Progressing   

## 2019-09-01 NOTE — Progress Notes (Addendum)
Patient ID: Jeremiah West, male   DOB: 06-18-1950, 69 y.o.   MRN: [id number]   SW received message from pt sister Jeremiah West who indicated preferred SNF locations: [address] and [place]. She is only seeking for pt be placed for short term rehab, and will decide care for patient at the end of his SNF treatment.   SW left message for Jeremiah West/Admissions with [facility] [phone] and Jeremiah West/Admissions with [facility] [phone]) to discuss referral. SW waiting on follow-up. *SW received message from Jeremiah West declining bed offer. SW later received a phone call from ot sister Jeremiah West stating she does not want [facility] as her brother almost died when he was at this location 5 years ago. She is ok with pt going to a location outside of [place] if that is all available. SW indicated will continue to explore options for SNF locations. SW also informed if there are no bed offers, this may be the only option. She is adamant about him not going to [facility].   Jeremiah West, MSW, LCSWA Office: [phone] Cell: [phone] Fax: [phone]

## 2019-09-01 NOTE — Plan of Care (Signed)
  Problem: RH BOWEL ELIMINATION Goal: RH STG MANAGE BOWEL WITH ASSISTANCE Description: STG Manage Bowel with min Assistance. Outcome: Not Progressing; LBM 5/11 Flowsheets (Taken 09/01/2019 1006) STG: Pt will manage bowels with assistance: 3-Moderate assistance   Problem: RH BLADDER ELIMINATION Goal: RH STG MANAGE BLADDER WITH ASSISTANCE Description: STG Manage Bladder With min Assistance Outcome: Not Progressing; incontinence at times   Problem: RH SAFETY Goal: RH STG ADHERE TO SAFETY PRECAUTIONS W/ASSISTANCE/DEVICE Description: STG Adhere to Safety Precautions With min/mod Assistance/Device. Outcome: Not Progressing; telesitter

## 2019-09-01 NOTE — Progress Notes (Signed)
Speech Language Pathology Daily Session Note  Patient Details  Name: Jeremiah West MRN: [id number] Date of Birth: [birth year]  Today's Date: 09/01/2019 SLP Individual Time: 1130-1155 SLP Individual Time Calculation (min): 25 min  Short Term Goals: Week 2: SLP Short Term Goal 1 (Week 2): Patient will demonstrate sustained attention to functional tasks for 15 minutes with Mod verbal cues for redirection. SLP Short Term Goal 2 (Week 2): Patient will demonstrate orientation X 4 with Max A multimodal cues. SLP Short Term Goal 3 (Week 2): Patient will demonstrate basic problem solving for basic and familiar tasks with Mod A verbal and visual cues. SLP Short Term Goal 4 (Week 2): Patient will verbalize basic wants/needs at the phrase level with Min verbal cues. SLP Short Term Goal 5 (Week 2): Patient will consume current diet with minimal overt s/s of aspiration with Mod verbal cues use of swallowing compensatory strategies. SLP Short Term Goal 6 (Week 2): Patient will demonstrate efficient mastication and complete oral clerance with trials of Dys. 3 textures over 2 sessions without overt s/s of aspiration and Mod verbal cues.  Skilled Therapeutic Interventions: Skilled treatment session focused on cognitive goals. Upon arrival, patient was awake while supine in bed and required more than a reasonable amount of time to agree to get up. Patient performed basic self-care tasks at the sink with overall Min A verbal cues for problem solving and thoroughness. Patient requested to use the bathroom and voided while standing. Patient placed back in bed but then got out of bed without assistance because he needed to use the bathroom again. Patient reminded he had just used the bathroom but was unable to recall. Patient left on commode with NT present. Continue with current plan of care.      Pain No/Denies Pain   Therapy/Group: Individual Therapy  Ralph Benavidez 09/01/2019, 12:27 PM

## 2019-09-01 NOTE — Progress Notes (Signed)
[facility]rem[facility] PHYSICAL MEDICINE & REHABILITATION PROGRESS NOTE   Subjective/Complaints: Pt up in bed eating breafkast. Appears comfortable. Slept most of night  ROS: Limited due to cognitive/behavioral     Objective:   No results found. No results for input(s): WBC, HGB, HCT, PLT in the last 72 hours. No results for input(s): NA, K, CL, CO2, GLUCOSE, BUN, CREATININE, CALCIUM in the last 72 hours.  Intake/Output Summary (Last 24 hours) at 09/01/2019 1036 Last data filed at 09/01/2019 0856 Gross per 24 hour  Intake 490 ml  Output --  Net 490 ml     Physical Exam: Vital Signs Blood pressure 116/68, pulse 78, temperature 97.7 F (36.5 C), temperature source Oral, resp. rate 18, height 5\' 7"  (1.702 m), weight 71 kg, SpO2 98 %.  Constitutional: No distress . Vital signs reviewed. HEENT: EOMI, oral membranes moist Neck: supple Cardiovascular: RRR without murmur. No JVD    Respiratory/Chest: CTA Bilaterally without wheezes or rales. Normal effort    GI/Abdomen: BS +, non-tender, non-distended Ext: no clubbing, cyanosis, or edema Psych: in good spirits. Musc: No edema in extremities.  No tenderness in extremities. Neuro: awake, followed basic commands. When asked questions of orientation he answered "over" repeatedly. Better with spontaneous speech, expressive aphasia? AOx1 only Motor: moves all 4's spontaneously, senses pain  Assessment/Plan: 1. Functional deficits secondary to TBI which require 3+ hours per day of interdisciplinary therapy in a comprehensive inpatient rehab setting.  Physiatrist is providing close team supervision and 24 hour management of active medical problems listed below.  Physiatrist and rehab team continue to assess barriers to discharge/monitor patient progress toward functional and medical goals  Care Tool:  Bathing  Bathing activity did not occur: Refused Body parts bathed by patient: Right arm, Left arm, Chest, Abdomen, Front perineal area, Right  upper leg, Left upper leg, Face, Right lower leg, Left lower leg   Body parts bathed by helper: Buttocks     Bathing assist Assist Level: Minimal Assistance - Patient > 75%     Upper Body Dressing/Undressing Upper body dressing   What is the patient wearing?: Pull over shirt    Upper body assist Assist Level: Supervision/Verbal cueing    Lower Body Dressing/Undressing Lower body dressing      What is the patient wearing?: Pants     Lower body assist Assist for lower body dressing: Contact Guard/Touching assist     Toileting Toileting    Toileting assist Assist for toileting: Minimal Assistance - Patient > 75%     Transfers Chair/bed transfer  Transfers assist  Chair/bed transfer activity did not occur: Safety/medical concerns  Chair/bed transfer assist level: Minimal Assistance - Patient > 75%     Locomotion Ambulation   Ambulation assist   Ambulation activity did not occur: Refused(pt refused gait w/o AD)  Assist level: Minimal Assistance - Patient > 75% Assistive device: Walker-rolling Max distance: 30'   Walk 10 feet activity   Assist  Walk 10 feet activity did not occur: Refused  Assist level: Minimal Assistance - Patient > 75% Assistive device: Walker-rolling   Walk 50 feet activity   Assist Walk 50 feet with 2 turns activity did not occur: Refused  Assist level: Minimal Assistance - Patient > 75% Assistive device: Walker-rolling    Walk 150 feet activity   Assist           Walk 10 feet on uneven surface  activity   Assist Walk 10 feet on uneven surfaces activity did not occur: Refused  Engineer, drilling activity did not occur: N/A         Wheelchair 50 feet with 2 turns activity    Assist    Wheelchair 50 feet with 2 turns activity did not occur: N/A       Wheelchair 150 feet activity     Assist  Wheelchair 150 feet activity did not occur: N/A       Blood pressure  116/68, pulse 78, temperature 97.7 F (36.5 C), temperature source Oral, resp. rate 18, height 5\' 7"  (1.702 m), weight 71 kg, SpO2 98 %.  Medical Problem List and Plan: 1.Functional and cognitive deficitssecondary to TBI with skull fracture  Continue CIR  -RLAS V, slow progress likely linked to significant ETOH abuse, aphasic also 2. Antithrombotics: -DVT/anticoagulation:Pharmaceutical:Lovenox -antiplatelet therapy: N/a 3. Pain Management:Ultram and/or oxycodone prn.    -continue scheduled tylenol qid for now.Well controlled.  4. Mood:LCSW to follow for evaluation and support when appropriate. -antipsychotic agents: seroquel 5. Neuropsych: This patientis notcapable of making decisions on hisown behalf.  In regular bed. Low bed preferred.   RLAS V   -dc klonopin  -wean hs klonopin also to limit day sedation while maintaining sleep pattern 6. Skin/Wound Care:routine pressure relief measures. 7. Fluids/Electrolytes/Nutrition:encourage PO 8. Agitation: At risk for falls.    - continue on hs klonopin 0.25mg  and seroquel 25mg  AM, nightly dose increased to 37.5   quite/controlled environment   -continue propranolol-seems to have helped  -sleep chart   9. Hyperglycemia:  Resolved off tf  10. History of polysubstance abuse: Continue thiamine and folic acid.  11. Dysphagia: D2 thins for now  Eating well   -recent labs acceptable   14.  Hyponatremia  Sodium 133 on 4/30, up to 136 5/3, up to 138 on 5/10 15. Disposition: SW sending SNF referrals  LOS: 14 days A FACE TO FACE EVALUATION WAS PERFORMED  5/30 09/01/2019, 10:36 AM

## 2019-09-01 NOTE — Progress Notes (Signed)
Physical Therapy Session Note  Patient Details  Name: Jeremiah West MRN: [id number] Date of Birth: [birth year]  Today's Date: 09/01/2019 PT Individual Time: 1215-1259 PT Individual Time Calculation (min): 44 min   Short Term Goals: Week 1:  PT Short Term Goal 1 (Week 1): Pt will tolerate 60 min of upright activity w/ minimal increase in fatigue PT Short Term Goal 1 - Progress (Week 1): Met PT Short Term Goal 2 (Week 1): Pt will initiate stair training PT Short Term Goal 2 - Progress (Week 1): Progressing toward goal PT Short Term Goal 3 (Week 1): Pt will ambulate 25' w/ mod assist w/ LRAD PT Short Term Goal 3 - Progress (Week 1): Progressing toward goal PT Short Term Goal 4 (Week 1): Pt will initiate functional tasks w/ min cues 50% of the time PT Short Term Goal 4 - Progress (Week 1): Met Week 2:  PT Short Term Goal 1 (Week 2): Pt will initiate stair training PT Short Term Goal 2 (Week 2): Pt will ambulate 63' w/ mod assist consistently using LRAD PT Short Term Goal 3 (Week 2): Pt will initiate functional tasks w/ only min cues 100% of the time PT Short Term Goal 4 (Week 2): Pt will perform bed<>chair transfer w/ CGA Week 3:     Skilled Therapeutic Interventions/Progress Updates:  Pain: Pt denies pain this pm     Pt initially seated at nursing station and agreeable to treatment. Pt transported to main gym for therapy session. Worked on standing balance/endurance/tolerance as wel as bilat UE coordination via standing at elevated bedside table and performeing the following tasks:   threading colored beads - required assist to initiate task, for threading of initial beads, for motor planning as pt tended to grab handfuls of beads rather than single bead and attempts to link beads without thread.  Was not able to perform simple pattern but did progress w/task to min assist.  Pill sorting and placing in pill box.  Attempts at sorting by color were unsuccessful and pt attempts to place  all beads into three compartments and did not attempt to adjust despite overfilling to point that he was unable to close compartments.  Clothespin box - pt able to complete attatching 12 clothespins to three rod levels in box w/pins placed to L and to r, pt uses both hands in alternating fashion to complete task.    With all activities pt demonstrates mild bilat knee flexion and forward lean posture.  Stairs: Pt asceded/descended 8 3in steps w/two rails w/cga and cues, alternates step over step and step to pattern.  Poor safety awareness.  Decreased control RLE w/ tendency to "stomp" when ascending. Repeated stairs x 2 w/seated rest between efforts.  Gait: 70f x 1, 363fx 1 pushing weighted grocery cart w/mod assist due to significant forward tendency, overall flexed posture, inability to maintain safe distance from cart despite tactile/verbal cues.  At end of session pt transported back to nurses station and nursing w/pt.    Therapy Documentation Precautions:  Precautions Precautions: Fall Precaution Comments: Soft waist Posey belt restraint, B wrist restraints Restrictions Weight Bearing Restrictions: No    Therapy/Group: Individual Therapy  BaCallie FieldingPTPoint of [place]ocks/13/2021, 2:27 PM

## 2019-09-01 NOTE — Progress Notes (Addendum)
Physical Therapy Session Note  Patient Details  Name: JAMEL HOLZMANN MRN: [id number] Date of Birth: [birth year]  Today's Date: 09/01/2019 PT Individual Time: 0920-0928 PT Individual Time Calculation (min): 8 min   Short Term Goals: Week 2:  PT Short Term Goal 1 (Week 2): Pt will initiate stair training PT Short Term Goal 2 (Week 2): Pt will ambulate 77' w/ mod assist consistently using LRAD PT Short Term Goal 3 (Week 2): Pt will initiate functional tasks w/ only min cues 100% of the time PT Short Term Goal 4 (Week 2): Pt will perform bed<>chair transfer w/ CGA  Skilled Therapeutic Interventions/Progress Updates:   Pt received asleep in supine, easily woken up but refusing activity. Therapist made multiple suggestions for activity, attempted to engage pt in conversation to promote participation, and opened all blinds and turned on all lights to promote wakefulness. Pt continued to refuse, he would only say "no" to every question regardless of the way it was worded by therapist. Pt missed 22 min of skilled PT 2/2 refusal.   Therapy Documentation Precautions:  Precautions Precautions: Fall Precaution Comments: Soft waist Posey belt restraint, B wrist restraints Restrictions Weight Bearing Restrictions: No  Therapy/Group: Individual Therapy  Artia Singley Melton Krebs 09/01/2019, 9:33 AM

## 2019-09-01 NOTE — Progress Notes (Signed)
Pt became agitated around 2100. He would not take his medicine. He was combative, yelling, and uncooperative with staff. He kept getting out of bed every few minutes also. He did take all his medications once he calmed down and spoke with wife. I called the oncall PA, Rinaldo Cloud love and she gave me a telephone order to give a one time dose of Seroquel 25mg  PO to add with the scheduled dose that was already given. Pt is now sleeping at this time (2346)  11-21-2002, LPN.

## 2019-09-01 NOTE — Progress Notes (Addendum)
Patient is agitated at this time; getting up by himself; pushing and hitting NT;  placed at nurses station.

## 2019-09-01 NOTE — Progress Notes (Signed)
Occupational Therapy Session Note  Patient Details  Name: Jeremiah West MRN: [id number] Date of Birth: October 03, 1950  Today's Date: 09/01/2019 OT Individual Time: 1300-1330 OT Individual Time Calculation (min): 30 min    Short Term Goals: Week 1:  OT Short Term Goal 1 (Week 1): Pt will consistently transfer to toilet wiht MIN A OT Short Term Goal 1 - Progress (Week 1): Met OT Short Term Goal 2 (Week 1): Pt will thread BLE into pants demoing improved dressing praxis OT Short Term Goal 2 - Progress (Week 1): Met OT Short Term Goal 3 (Week 1): Pt will stand to groom with MIN A and MOD VC for sequncing OT Short Term Goal 3 - Progress (Week 1): Met OT Short Term Goal 4 (Week 1): Pt will bathe UB with MOD VC for initation OT Short Term Goal 4 - Progress (Week 1): Met  Skilled Therapeutic Interventions/Progress Updates:    1;1. Pt received in w/c at RN station with no pain reported and agreeable to eating lunch. Pt self feeds with mod VC/A to intervene d/t rapid rate of consumption of foods. Pt with no awareness of how much bolus of food is in mouth before attempting to take another bite. Followed suggestions of swallowing plan per SLP. Pt completes standing dynavision task with CGA and RW at dynavision requiring 8.5 seconds to locate stimulus on average across all 4 quadrants in moderately stimulating environment for selective attention. Pt impulsively walks with RW requiring MAX VC for RW management/proxitmity to RW and physical A to keep RW from rolling out from under pt. Pt returned to bed exit alarm on and call light in reach at end of session  Therapy Documentation Precautions:  Precautions Precautions: Fall Precaution Comments: Soft waist Posey belt restraint, B wrist restraints Restrictions Weight Bearing Restrictions: No General:   Vital Signs:   Pain:   ADL: ADL Grooming: Moderate assistance Where Assessed-Grooming: Standing at sink Upper Body Bathing: Maximal  assistance Where Assessed-Upper Body Bathing: Sitting at sink Lower Body Bathing: Maximal assistance Where Assessed-Lower Body Bathing: Sitting at sink Upper Body Dressing: Moderate assistance Where Assessed-Upper Body Dressing: Sitting at sink Lower Body Dressing: Maximal assistance Where Assessed-Lower Body Dressing: Sitting at sink, Standing at sink Toilet Transfer: Minimal assistance Toilet Transfer Method: Stand pivot Toilet Transfer Equipment: Grab bars Vision   Perception    Praxis   Exercises:   Other Treatments:     Therapy/Group: Individual Therapy  Tonny Branch 09/01/2019, 1:30 PM

## 2019-09-02 ENCOUNTER — Inpatient Hospital Stay (HOSPITAL_COMMUNITY): Payer: Medicare Other

## 2019-09-02 ENCOUNTER — Inpatient Hospital Stay (HOSPITAL_COMMUNITY): Payer: Medicare Other | Admitting: Physical Therapy

## 2019-09-02 NOTE — Plan of Care (Signed)
  Problem: RH BOWEL ELIMINATION Goal: RH STG MANAGE BOWEL WITH ASSISTANCE Description: STG Manage Bowel with min Assistance. Outcome: Not Progressing   Problem: RH BLADDER ELIMINATION Goal: RH STG MANAGE BLADDER WITH ASSISTANCE Description: STG Manage Bladder With min Assistance Outcome: Not Progressing   Problem: RH SAFETY Goal: RH STG ADHERE TO SAFETY PRECAUTIONS W/ASSISTANCE/DEVICE Description: STG Adhere to Safety Precautions With min/mod Assistance/Device. Outcome: Not Progressing

## 2019-09-02 NOTE — Progress Notes (Addendum)
Occupational Therapy Weekly Progress Note  Patient Details  Name: Jeremiah West MRN: [id number] Date of Birth: 1950/12/13  Beginning of progress report period: Aug 26, 2019 End of progress report period: Sep 02, 2019  Today's Date: 09/02/2019 OT Missed Time: 30 Minutes Missed Time Reason: Patient unwilling/refused to participate without medical reason   Patient has met 2 of 4 short term goals.  Pt has made minimal progress this reporting period d/t refusal to participate in therapy and decreased command following. Pt is able to completes bathing and dressing tasks at a CGA level with VC for sequencing. Pt with poor termination with bathing and oral care tasks and has no regard for cuing for posture and AD management. Pt has been changed to QD schedule d/t poor participation.  Patient continues to demonstrate the following deficits: muscle weakness, decreased cardiorespiratoy endurance, impaired timing and sequencing, decreased coordination, and decreased motor planning, decreased visual acuity, decreased initiation, decreased attention, decreased awareness, decreased problem solving, decreased safety awareness, decreased memory, and delayed processing, and decreased sitting balance, decreased standing balance, decreased postural control, and decreased balance strategies and therefore will continue to benefit from skilled OT intervention to enhance overall performance with BADL and iADL.  Patient progressing toward long term goals..  Continue plan of care.  OT Short Term Goals Week 2:  OT Short Term Goal 1 (Week 2): Pt will terminate bathing with min VC OT Short Term Goal 1 - Progress (Week 2): Progressing toward goal OT Short Term Goal 2 (Week 2): Pt will don B socks iwht MIN A OT Short Term Goal 2 - Progress (Week 2): Met OT Short Term Goal 3 (Week 2): Pt will don pants with MIN A for standing balnace only OT Short Term Goal 3 - Progress (Week 2): Met OT Short Term Goal 4 (Week 2): Pt  will orient clothing to self with min cuing OT Short Term Goal 4 - Progress (Week 2): Progressing toward goal Week 3:  OT Short Term Goal 1 (Week 3): Pt will participate in 80% of tx sessions OT Short Term Goal 2 (Week 3): pt will terminate bathing with min VC   Pt missed 30 min skilled OT d/t refusal. Pt sister in room and educated on OT role/purpose and pts lack of engagement in tx. Pt sister present for behaviors: refusing to get OOB, declining tx, pulling covers overhead and pushing OT hand away. Pt sister states, "he wont do any thing, go take a break." Attempted 15 min later to engage pt and pt continues to decline tx.   Therapy Documentation Precautions:  Precautions Precautions: Fall Precaution Comments: Belt alarm on at end of session or exit alarm on bed Restrictions Weight Bearing Restrictions: No General:   Vital Signs: Therapy Vitals Temp: 98.6 F (37 C) Temp Source: Oral Pulse Rate: 74 Resp: 18 BP: 130/81 Patient Position (if appropriate): Lying Oxygen Therapy SpO2: 94 % O2 Device: Room Air Pain:   ADL: ADL Grooming: Moderate assistance Where Assessed-Grooming: Standing at sink Upper Body Bathing: Maximal assistance Where Assessed-Upper Body Bathing: Sitting at sink Lower Body Bathing: Maximal assistance Where Assessed-Lower Body Bathing: Sitting at sink Upper Body Dressing: Moderate assistance Where Assessed-Upper Body Dressing: Sitting at sink Lower Body Dressing: Maximal assistance Where Assessed-Lower Body Dressing: Sitting at sink, Standing at sink Toilet Transfer: Minimal assistance Toilet Transfer Method: Stand pivot Toilet Transfer Equipment: Grab bars Vision   Perception    Praxis   Exercises:   Other Treatments:     Therapy/Group:  Individual Therapy  Tonny Branch 09/02/2019, 7:00 AM

## 2019-09-02 NOTE — Progress Notes (Signed)
Pt was calm and cooperative throughout the night. Pt did have impulsive episodes, the first one @2000  when pt was at edge of bed, wanting to use bathroom. The second time was @0300  when staff found pt already standing out of bed. The bed was in it's lowest position, bed alarm on, both floor mats on floor, and tele-sitter camera in use (Staff was not alerted by tele-sitter that pt attempted to leave bed). Overall, pt did not have any episodes of aggression. Pt slept most of the night, per sleep chart.  Pt currently resting.

## 2019-09-02 NOTE — Progress Notes (Signed)
[place] PHYSICAL MEDICINE & REHABILITATION PROGRESS NOTE   Subjective/Complaints: Pt up in his w/c. Indicates that he didn't sleep that well. Sister in room with him  ROS: Limited due to cognitive/behavioral   Objective:   No results found. No results for input(s): WBC, HGB, HCT, PLT in the last 72 hours. No results for input(s): NA, K, CL, CO2, GLUCOSE, BUN, CREATININE, CALCIUM in the last 72 hours.  Intake/Output Summary (Last 24 hours) at 09/02/2019 1037 Last data filed at 09/01/2019 1841 Gross per 24 hour  Intake 240 ml  Output --  Net 240 ml     Physical Exam: Vital Signs Blood pressure 130/81, pulse 74, temperature 98.6 F (37 C), temperature source Oral, resp. rate 18, height 5\' 7"  (1.702 m), weight 68.1 kg, SpO2 94 %.  Constitutional: No distress . Vital signs reviewed. HEENT: EOMI, oral membranes moist Neck: supple Cardiovascular: RRR without murmur. No JVD    Respiratory/Chest: CTA Bilaterally without wheezes or rales. Normal effort    GI/Abdomen: BS +, non-tender, non-distended Ext: no clubbing, cyanosis, or edema Psych: cooperative, restless, non-agitated Psych: in good spirits. Musc: No edema in extremities.  No tenderness in extremities. Neuro: awake, followed basic commands. Better expressive language today but still with word finding deficits AOx1 only Motor: moves all 4's spontaneously, senses pain  Assessment/Plan: 1. Functional deficits secondary to TBI which require 3+ hours per day of interdisciplinary therapy in a comprehensive inpatient rehab setting.  Physiatrist is providing close team supervision and 24 hour management of active medical problems listed below.  Physiatrist and rehab team continue to assess barriers to discharge/monitor patient progress toward functional and medical goals  Care Tool:  Bathing  Bathing activity did not occur: Refused Body parts bathed by patient: Right arm, Left arm, Chest, Abdomen, Front perineal area,  Right upper leg, Left upper leg, Face, Right lower leg, Left lower leg   Body parts bathed by helper: Buttocks     Bathing assist Assist Level: Minimal Assistance - Patient > 75%     Upper Body Dressing/Undressing Upper body dressing   What is the patient wearing?: Pull over shirt    Upper body assist Assist Level: Supervision/Verbal cueing    Lower Body Dressing/Undressing Lower body dressing      What is the patient wearing?: Pants     Lower body assist Assist for lower body dressing: Contact Guard/Touching assist     Toileting Toileting    Toileting assist Assist for toileting: Minimal Assistance - Patient > 75%     Transfers Chair/bed transfer  Transfers assist  Chair/bed transfer activity did not occur: Safety/medical concerns  Chair/bed transfer assist level: Minimal Assistance - Patient > 75%     Locomotion Ambulation   Ambulation assist   Ambulation activity did not occur: Refused(pt refused gait w/o AD)  Assist level: Moderate Assistance - Patient 50 - 74% Assistive device: Walker-rolling Max distance: 25'   Walk 10 feet activity   Assist  Walk 10 feet activity did not occur: Refused  Assist level: Moderate Assistance - Patient - 50 - 74% Assistive device: Walker-rolling   Walk 50 feet activity   Assist Walk 50 feet with 2 turns activity did not occur: (did not perform turning w/gait)  Assist level: Minimal Assistance - Patient > 75% Assistive device: Walker-rolling    Walk 150 feet activity   Assist           Walk 10 feet on uneven surface  activity   Assist Walk 10  feet on uneven surfaces activity did not occur: Actor activity did not occur: N/A         Wheelchair 50 feet with 2 turns activity    Assist    Wheelchair 50 feet with 2 turns activity did not occur: N/A       Wheelchair 150 feet activity     Assist  Wheelchair 150 feet activity did  not occur: N/A       Blood pressure 130/81, pulse 74, temperature 98.6 F (37 C), temperature source Oral, resp. rate 18, height 5\' 7"  (1.702 m), weight 68.1 kg, SpO2 94 %.  Medical Problem List and Plan: 1.Functional and cognitive deficitssecondary to TBI with skull fracture  Continue CIR  -RLAS V, slow progress likely linked to significant ETOH abuse, aphasic also  -5/14 spoke to sister at length yesterday and again today. They are unable to provide him consistent supervision at home. SNF pending 2. Antithrombotics: -DVT/anticoagulation:Pharmaceutical:Lovenox -antiplatelet therapy: N/a 3. Pain Management:Ultram and/or oxycodone prn.    -continue scheduled tylenol qid for now.Well controlled.  4. Mood:LCSW to follow for evaluation and support when appropriate. -antipsychotic agents: seroquel 5. Neuropsych: This patientis notcapable of making decisions on hisown behalf.  Regular bed now   RLAS V   -off klonopin  -weaning seroquel also to limit day sedation while still maintaining sleep pattern 6. Skin/Wound Care:routine pressure relief measures. 7. Fluids/Electrolytes/Nutrition:good appetite  -f/u labs Monday 5/17 8. Agitation: At risk for falls.    - environmental mods  -continue propranolol-seems to have helped  -continue sleep chart   9. Hyperglycemia:  Resolved off tf  10. History of polysubstance abuse: Continue thiamine and folic acid.  11. Dysphagia: D2 thins for now  Eating well   -recent labs acceptable   14.  Hyponatremia  Sodium 133 on 4/30, up to 136 5/3, up to 138 on 5/10   LOS: 15 days A FACE TO FACE EVALUATION WAS PERFORMED  7/10 09/02/2019, 10:37 AM     [place] PHYSICAL MEDICINE & REHABILITATION PROGRESS NOTE   Subjective/Complaints: Pt up in bed eating breafkast. Appears comfortable. Slept most of night  ROS: Limited due to cognitive/behavioral     Objective:   No results found. No  results for input(s): WBC, HGB, HCT, PLT in the last 72 hours. No results for input(s): NA, K, CL, CO2, GLUCOSE, BUN, CREATININE, CALCIUM in the last 72 hours.  Intake/Output Summary (Last 24 hours) at 09/02/2019 1037 Last data filed at 09/01/2019 1841 Gross per 24 hour  Intake 240 ml  Output --  Net 240 ml     Physical Exam: Vital Signs Blood pressure 130/81, pulse 74, temperature 98.6 F (37 C), temperature source Oral, resp. rate 18, height 5\' 7"  (1.702 m), weight 68.1 kg, SpO2 94 %.  Constitutional: No distress . Vital signs reviewed. HEENT: EOMI, oral membranes moist Neck: supple Cardiovascular: RRR without murmur. No JVD    Respiratory/Chest: CTA Bilaterally without wheezes or rales. Normal effort    GI/Abdomen: BS +, non-tender, non-distended Ext: no clubbing, cyanosis, or edema Psych: in good spirits. Musc: No edema in extremities.  No tenderness in extremities. Neuro: awake, followed basic commands. When asked questions of orientation he answered "over" repeatedly. Better with spontaneous speech, expressive aphasia? AOx1 only Motor: moves all 4's spontaneously, senses pain  Assessment/Plan: 1. Functional deficits secondary to TBI which require 3+ hours per  day of interdisciplinary therapy in a comprehensive inpatient rehab setting.  Physiatrist is providing close team supervision and 24 hour management of active medical problems listed below.  Physiatrist and rehab team continue to assess barriers to discharge/monitor patient progress toward functional and medical goals  Care Tool:  Bathing  Bathing activity did not occur: Refused Body parts bathed by patient: Right arm, Left arm, Chest, Abdomen, Front perineal area, Right upper leg, Left upper leg, Face, Right lower leg, Left lower leg   Body parts bathed by helper: Buttocks     Bathing assist Assist Level: Minimal Assistance - Patient > 75%     Upper Body Dressing/Undressing Upper body dressing   What is the  patient wearing?: Pull over shirt    Upper body assist Assist Level: Supervision/Verbal cueing    Lower Body Dressing/Undressing Lower body dressing      What is the patient wearing?: Pants     Lower body assist Assist for lower body dressing: Contact Guard/Touching assist     Toileting Toileting    Toileting assist Assist for toileting: Minimal Assistance - Patient > 75%     Transfers Chair/bed transfer  Transfers assist  Chair/bed transfer activity did not occur: Safety/medical concerns  Chair/bed transfer assist level: Minimal Assistance - Patient > 75%     Locomotion Ambulation   Ambulation assist   Ambulation activity did not occur: Refused(pt refused gait w/o AD)  Assist level: Moderate Assistance - Patient 50 - 74% Assistive device: Walker-rolling Max distance: 25'   Walk 10 feet activity   Assist  Walk 10 feet activity did not occur: Refused  Assist level: Moderate Assistance - Patient - 50 - 74% Assistive device: Walker-rolling   Walk 50 feet activity   Assist Walk 50 feet with 2 turns activity did not occur: (did not perform turning w/gait)  Assist level: Minimal Assistance - Patient > 75% Assistive device: Walker-rolling    Walk 150 feet activity   Assist           Walk 10 feet on uneven surface  activity   Assist Walk 10 feet on uneven surfaces activity did not occur: Actor activity did not occur: N/A         Wheelchair 50 feet with 2 turns activity    Assist    Wheelchair 50 feet with 2 turns activity did not occur: N/A       Wheelchair 150 feet activity     Assist  Wheelchair 150 feet activity did not occur: N/A       Blood pressure 130/81, pulse 74, temperature 98.6 F (37 C), temperature source Oral, resp. rate 18, height 5\' 7"  (1.702 m), weight 68.1 kg, SpO2 94 %.  Medical Problem List and Plan: 1.Functional and cognitive  deficitssecondary to TBI with skull fracture  Continue CIR  -RLAS V, slow progress likely linked to significant ETOH abuse, aphasic also 2. Antithrombotics: -DVT/anticoagulation:Pharmaceutical:Lovenox -antiplatelet therapy: N/a 3. Pain Management:Ultram and/or oxycodone prn.    -continue scheduled tylenol qid for now.Well controlled.  4. Mood:LCSW to follow for evaluation and support when appropriate. -antipsychotic agents: seroquel 5. Neuropsych: This patientis notcapable of making decisions on hisown behalf.  In regular bed. Low bed preferred.   RLAS V   -dc klonopin  -wean hs klonopin also to limit day sedation while maintaining sleep pattern 6. Skin/Wound Care:routine pressure relief measures. 7. Fluids/Electrolytes/Nutrition:encourage PO  8. Agitation: At risk for falls.    - continue on hs klonopin 0.25mg  and seroquel 25mg  AM, nightly dose increased to 37.5   quite/controlled environment   -continue propranolol-seems to have helped  -sleep chart   9. Hyperglycemia:  Resolved off tf  10. History of polysubstance abuse: Continue thiamine and folic acid.  11. Dysphagia: D2 thins for now  Eating well   -recent labs acceptable   14.  Hyponatremia  Sodium 133 on 4/30, up to 136 5/3, up to 138 on 5/10 15. Disposition: SW sending SNF referrals  LOS: 15 days A FACE TO [place] 09/02/2019, 10:37 AM

## 2019-09-02 NOTE — Progress Notes (Addendum)
4:23pm-Nurse and 3 other nurses enter the room because of patient dangling over side of bed with legs hanging, attempting to get out of bed. All nurses attempt to get patient to comply and lie down with feet in the bed, which he begins yelling at the nurses. Nurse calls patient's sister to try and get her to calm patient down. Patient begins screaming into phone "DO IT THEN" repeatedly when sister tries to tell patient to stay in bed and not get up by himself. Patient has now required 1 to 1 care for an hour and a half.  4:00pm-Nurse has now been in patient's room 5 more times because of bed alarm and telesitter going off and patient attempting to get out of bed. Nurse has toileted patient, offered to take patient up to nurses station which patient states "no". Patient is screaming into telephone "HELLO HELLO HELLO". Nurse calls all the people the patient wants to call. Patient is continuously trying to get out of bed by putting his legs over bed side rails. Nurse asks patient if he wants to get into wheelchair which he agrees, then before being able to put safety belt on patient he is already standing back up attempting to go back to bed. Patient is still requiring 1 to 1 care at this point making it difficult to take care of other patients.  3:30pm- Patient's telesitter and bed alarm has gone off 6 times within the past 30 minutes. Last attempt he was already standing up, out of bed, with only the bed alarm going off; leaning forward holding onto the sink. Every other time patient has legs over side of the bed and is attempting to get out of the bed. Patient is verbally aggressive stating to "get out of his way". Patient aggressively shoves walker away when asked to use and started shouting at nurse. Patient begins to hold onto sink and near by objects to use to try and walk with. Nurse tries to educate importance of using the walker, the patient is getting more upset with each re direct attempt made by nurse  and he is raising his voice. Took patient to bathroom for him to just flush the toilet turn around, when asked if he had to use the bathroom the patient stated "no". Each other time patient states he is going "nowhere" when asked where he is trying to go, although he continues to stand up and attempt to walk around the room. Patient is requiring 1 to 1 care at this point.

## 2019-09-02 NOTE — Progress Notes (Signed)
Physical Therapy Weekly Progress Note  Patient Details  Name: Jeremiah West MRN: [id number] Date of Birth: November 30, 1950  Beginning of progress report period: Aug 25, 2019 End of progress report period: Sep 02, 2019  Today's Date: 09/02/2019 PT Individual Time: 2536-6440 PT Individual Time Calculation (min): 30 min   Patient has met 1 of 4 short term goals. Pt's progress towards LTGs over last week has slowed considerably 2/2 poor participation and fatigue. He consistently requires min assist for all standing activity including gait up to 50' w/ RW, although continues to require mod multimodal cues for initiation, motor planning, attention to task, and sequencing of tasks. He demonstrates behaviors consistent w/ Rancho Level VI, carryover of functional tasks is limited and his lack of participation appears behavioral in nature at this time vs TBI-related.   Patient continues to demonstrate the following deficits muscle weakness, decreased cardiorespiratoy endurance, impaired timing and sequencing, unbalanced muscle activation, motor apraxia, decreased coordination and decreased motor planning, decreased attention to right, decreased initiation, decreased attention, decreased awareness, decreased problem solving, decreased safety awareness, decreased memory and delayed processing and decreased standing balance, decreased postural control and decreased balance strategies and therefore will continue to benefit from skilled PT intervention to increase functional independence with mobility.  Patient progressing toward long term goals..  Continue plan of care. Pt's frequency now QD (1x/day) 2/2 limited participation. Car transfer goal also discontinued 2/2 d/c plan now SNF. LTGs of supervision-min assist overall remain appropriate.   PT Short Term Goals Week 2:  PT Short Term Goal 1 (Week 2): Pt will initiate stair training PT Short Term Goal 1 - Progress (Week 2): Not progressing PT Short Term Goal 2  (Week 2): Pt will ambulate 72' w/ mod assist consistently using LRAD PT Short Term Goal 2 - Progress (Week 2): Met PT Short Term Goal 3 (Week 2): Pt will initiate functional tasks w/ only min cues 100% of the time PT Short Term Goal 3 - Progress (Week 2): Progressing toward goal PT Short Term Goal 4 (Week 2): Pt will perform bed<>chair transfer w/ CGA PT Short Term Goal 4 - Progress (Week 2): Progressing toward goal Week 3:  PT Short Term Goal 1 (Week 3): Pt will initiate functional tasks w/ min cues 100% of the time PT Short Term Goal 2 (Week 3): Pt will ambulate 28' w/ min assist w/ LRAD consistently PT Short Term Goal 3 (Week 3): Pt will maintain selective attention to functional tasks in controlled environment w/ min assist  Skilled Therapeutic Interventions/Progress Updates:   Pt received in supine and initially refusing OOB activity. Pt's brief noted to be soaked and he was agreeable to change brief. Supervision bed mobility. Performed multiple sit<>stands to RW w/ min assist, mod verbal and tactile cues for technique and UE placement. Total assist for pericare and brief management. Max encouragement for pt to transfer to w/c to eat breakfast, did so w/ min-mod assist. Verbal and tactile cues again for sequencing and UE placement. Once sitting up in w/c, set-up to eat breakfast. Verbal cues to slow speed, completely swallow prior to initiating another bite, and tactile/verbal cues for motor planning w/ holding cup and using spoon/fork. Pt w/ increased restlessness, needed frequent cues to scoot back in chair vs trying to stand up mid-breakfast. Pt's sister, Ivin Booty, present for 2nd half of session. She expressed concern that he is not getting up enough w/ therapy and not getting the "care he needs". She requests that staff needs to encourage  him and spend time convincing him to participate, take medication, and eat. She also expressed concern that staff was not attending to him as his room "stinks  of urine". Therapist educated her on pt's brain injury deficits that limit participation at times including poor situational awareness and confusion, poor motor planning and initiation, impaired ability to express his needs, and often need for increased sleep w/ a brain injury.  Reassured her that this therapist personally observes nursing staff attending to the pt more so than other patients on the unit 2/2 his unique needs and deficits. Also educated her on his right to refuse therapy and that this therapist cannot physically force his participation, pt's sister agrees and asks for staff not to do that. Demonstrated pt's ability to ambulate w/ min-mod assist using RW x25'. Manual assist for upright posture and RW management. She is concerned that SNFs are not receiving this information, reassured her that they do receive information about his functional level. Pt's sister appreciative of therapist discussing these things with her. Pt remained in w/c, chair alarm belt on, and under supervision of sister. All needs met.   Therapy Documentation Precautions:  Precautions Precautions: Fall Precaution Comments: Soft waist Posey belt restraint, B wrist restraints Restrictions Weight Bearing Restrictions: No  Therapy/Group: Individual Therapy  Amy [name] 09/02/2019, 9:23 AM

## 2019-09-02 NOTE — Plan of Care (Signed)
  Problem: RH Car Transfers Goal: LTG Patient will perform car transfers with assist (PT) Description: LTG: Patient will perform car transfers with assistance (PT). Outcome: Not Applicable Flowsheets (Taken 09/02/2019 0748) LTG: Pt will perform car transfers with assist:: (goal discharge 5/14 due to planned d/c to SNF - Keenan Trefry T) -- Note: goal discharge 5/14 due to planned d/c to SNF - Xander Jutras T

## 2019-09-02 NOTE — Progress Notes (Signed)
Speech Language Pathology Weekly Progress Note  Patient Details  Name: MCCRAE SPECIALE MRN: [id number] Date of Birth: 01-17-1951  Beginning of progress report period: Aug 26, 2019 End of progress report period: Sep 02, 2019   Short Term Goals: Week 2: SLP Short Term Goal 1 (Week 2): Patient will demonstrate sustained attention to functional tasks for 15 minutes with Mod verbal cues for redirection. SLP Short Term Goal 1 - Progress (Week 2): Not met SLP Short Term Goal 2 (Week 2): Patient will demonstrate orientation X 4 with Max A multimodal cues. SLP Short Term Goal 2 - Progress (Week 2): Not met SLP Short Term Goal 3 (Week 2): Patient will demonstrate basic problem solving for basic and familiar tasks with Mod A verbal and visual cues. SLP Short Term Goal 3 - Progress (Week 2): Not met SLP Short Term Goal 4 (Week 2): Patient will verbalize basic wants/needs at the phrase level with Min verbal cues. SLP Short Term Goal 4 - Progress (Week 2): Met SLP Short Term Goal 5 (Week 2): Patient will consume current diet with minimal overt s/s of aspiration with Mod verbal cues use of swallowing compensatory strategies. SLP Short Term Goal 5 - Progress (Week 2): Not met SLP Short Term Goal 6 (Week 2): Patient will demonstrate efficient mastication and complete oral clerance with trials of Dys. 3 textures over 2 sessions without overt s/s of aspiration and Mod verbal cues. SLP Short Term Goal 6 - Progress (Week 2): Not met    New Short Term Goals: Week 3: SLP Short Term Goal 1 (Week 3): Patient will demonstrate sustained attention to functional tasks for 15 minutes with Mod verbal cues for redirection. SLP Short Term Goal 2 (Week 3): Patient will demonstrate orientation X 4 with Max A multimodal cues. SLP Short Term Goal 3 (Week 3): Patient will demonstrate basic problem solving for basic and familiar tasks with Mod A verbal and visual cues. SLP Short Term Goal 4 (Week 3): Patient will verbalize  basic wants/needs at the phrase level with Min verbal cues. SLP Short Term Goal 5 (Week 3): Patient will consume current diet with minimal overt s/s of aspiration with Mod verbal cues use of swallowing compensatory strategies. SLP Short Term Goal 6 (Week 3): Patient will demonstrate efficient mastication and complete oral clerance with trials of Dys. 3 textures over 2 sessions without overt s/s of aspiration and Mod verbal cues.  Weekly Progress Updates: Patient has made minimal progress and has not met any STGs this reporting period. Currently, patient continues to demonstrate behaviors consistent with a Rancho Level VI and requires overall Max A multimodal cues to complete basic functional and familiar tasks safely in regards to attention, problem solving, and orientation with use visual aids. Patient's progress has been limited by decreased participation due to fatigue. Patient is consuming Dys. 2 textures with thin liquids with minimal overt s/s of aspiration but requires overall Max A multimodal cues for use of swallowing compensatory strategies to maximize safety. Patient can express his basic wants and needs but demonstrates limited engagement in any other verbal conversation and utilizes only single word answers. Patient and family education ongoing. Patient would benefit from continued skilled SLP intervention to maximize his cognitive and swallowing function prior to discharge.      Intensity: Minumum of 1-2 x/day, 30 to 90 minutes Frequency: 3 to 5 out of 7 days Duration/Length of Stay: TBD due to SNF placement Treatment/Interventions: Cognitive remediation/compensation;Internal/external aids;Dysphagia/aspiration precaution training;Speech/Language facilitation;Therapeutic Activities;Environmental controls;Cueing hierarchy;Functional  tasks;Patient/family education     Corlene Sabia, [place] Sous[place] 09/02/2019, 6:43 AM

## 2019-09-03 ENCOUNTER — Inpatient Hospital Stay (HOSPITAL_COMMUNITY): Payer: Medicare Other

## 2019-09-03 MED ORDER — QUETIAPINE FUMARATE 25 MG PO TABS
25.0000 mg | ORAL_TABLET | Freq: Every day | ORAL | Status: DC | PRN
  Administered 2019-09-03: 25 mg via ORAL
  Filled 2019-09-03: qty 1

## 2019-09-03 NOTE — Plan of Care (Addendum)
  Problem: RH BLADDER ELIMINATION Goal: RH STG MANAGE BLADDER WITH ASSISTANCE Description: STG Manage Bladder With min Assistance Outcome: Not Progressing; incontinence at times   Problem: RH SAFETY Goal: RH STG ADHERE TO SAFETY PRECAUTIONS W/ASSISTANCE/DEVICE Description: STG Adhere to Safety Precautions With min/mod Assistance/Device. 09/03/2019 1001 by Pierre Bali, RN Flowsheets (Taken 09/03/2019 1001) STG:Pt will adhere to safety precautions with assistance/device: 3-Moderate assistance 09/03/2019 0954 by Pierre Bali, RN Outcome: Not Progressing; telesitter   Problem: RH KNOWLEDGE DEFICIT BRAIN INJURY Goal: RH STG INCREASE KNOWLEDGE OF SELF CARE AFTER BRAIN INJURY Description: With min/mod assist 09/03/2019 1002 by Pierre Bali, RN Outcome: Not Progressing; confusion; MD notified of patient's agitation in the afternoon; new orders noted 09/03/2019 1001 by Pierre Bali, RN Note: Moderate assistance 09/03/2019 0954 by Pierre Bali, RN Outcome: Not Progressing;

## 2019-09-03 NOTE — Progress Notes (Signed)
Patient ate dinner ; calmer at this time and requested  to go back to bed.

## 2019-09-03 NOTE — Progress Notes (Signed)
Patient is getting agitated ; getting out of the bed; prn med given; patient toileted; placed at nurses station.

## 2019-09-03 NOTE — Progress Notes (Signed)
[place] PHYSICAL MEDICINE & REHABILITATION PROGRESS NOTE   Subjective/Complaints: Now sleeping soundly in bed, but was agitated in afternoon trying to get out of bed. Vitals stable  ROS: Limited due to cognitive/behavioral   Objective:   No results found. No results for input(s): WBC, HGB, HCT, PLT in the last 72 hours. No results for input(s): NA, K, CL, CO2, GLUCOSE, BUN, CREATININE, CALCIUM in the last 72 hours.  Intake/Output Summary (Last 24 hours) at 09/03/2019 1759 Last data filed at 09/03/2019 1330 Gross per 24 hour  Intake 360 ml  Output --  Net 360 ml     Physical Exam: Vital Signs Blood pressure 116/72, pulse 72, temperature 97.6 F (36.4 C), temperature source Oral, resp. rate 18, height 5\' 7"  (1.702 m), weight 70.8 kg, SpO2 100 %.  Constitutional: No distress . Vital signs reviewed. HEENT: EOMI, oral membranes moist Neck: supple Cardiovascular: RRR without murmur. No JVD    Respiratory/Chest: CTA Bilaterally without wheezes or rales. Normal effort    GI/Abdomen: BS +, non-tender, non-distended Ext: no clubbing, cyanosis, or edema Psych: cooperative, restless, non-agitated at this time, but trying to get out of bed earlier today Psych: in good spirits. Musc: No edema in extremities.  No tenderness in extremities. Neuro: awake, followed basic commands. Better expressive language today but still with word finding deficits AOx1 only Motor: moves all 4's spontaneously, senses pain  Assessment/Plan: 1. Functional deficits secondary to TBI which require 3+ hours per day of interdisciplinary therapy in a comprehensive inpatient rehab setting.  Physiatrist is providing close team supervision and 24 hour management of active medical problems listed below.  Physiatrist and rehab team continue to assess barriers to discharge/monitor patient progress toward functional and medical goals  Care Tool:  Bathing  Bathing activity did not occur: Refused Body parts  bathed by patient: Right arm, Left arm, Chest, Abdomen, Front perineal area, Right upper leg, Left upper leg, Face, Right lower leg, Left lower leg   Body parts bathed by helper: Buttocks     Bathing assist Assist Level: Minimal Assistance - Patient > 75%     Upper Body Dressing/Undressing Upper body dressing   What is the patient wearing?: Pull over shirt    Upper body assist Assist Level: Supervision/Verbal cueing    Lower Body Dressing/Undressing Lower body dressing      What is the patient wearing?: Pants     Lower body assist Assist for lower body dressing: Contact Guard/Touching assist     Toileting Toileting    Toileting assist Assist for toileting: Minimal Assistance - Patient > 75%     Transfers Chair/bed transfer  Transfers assist  Chair/bed transfer activity did not occur: Safety/medical concerns  Chair/bed transfer assist level: Minimal Assistance - Patient > 75%     Locomotion Ambulation   Ambulation assist   Ambulation activity did not occur: Refused(pt refused gait w/o AD)  Assist level: Moderate Assistance - Patient 50 - 74% Assistive device: Walker-rolling Max distance: 25'   Walk 10 feet activity   Assist  Walk 10 feet activity did not occur: Refused  Assist level: Moderate Assistance - Patient - 50 - 74% Assistive device: Walker-rolling   Walk 50 feet activity   Assist Walk 50 feet with 2 turns activity did not occur: (did not perform turning w/gait)  Assist level: Minimal Assistance - Patient > 75% Assistive device: Walker-rolling    Walk 150 feet activity   Assist  Walk 10 feet on uneven surface  activity   Assist Walk 10 feet on uneven surfaces activity did not occur: Actor activity did not occur: N/A         Wheelchair 50 feet with 2 turns activity    Assist    Wheelchair 50 feet with 2 turns activity did not occur: N/A        Wheelchair 150 feet activity     Assist  Wheelchair 150 feet activity did not occur: N/A       Blood pressure 116/72, pulse 72, temperature 97.6 F (36.4 C), temperature source Oral, resp. rate 18, height 5\' 7"  (1.702 m), weight 70.8 kg, SpO2 100 %.  Medical Problem List and Plan: 1.Functional and cognitive deficitssecondary to TBI with skull fracture  Continue CIR  -RLAS V, slow progress likely linked to significant ETOH abuse, aphasic also  -5/14 spoke to sister at length yesterday and again today. They are unable to provide him consistent supervision at home. SNF pending 2. Antithrombotics: -DVT/anticoagulation:Pharmaceutical:Lovenox -antiplatelet therapy: N/a 3. Pain Management:Ultram and/or oxycodone prn.    -continue scheduled tylenol qid for now.Well controlled  4. Mood:LCSW to follow for evaluation and support when appropriate. -antipsychotic agents: seroquel 5. Neuropsych: This patientis notcapable of making decisions on hisown behalf.  Regular bed now   RLAS V   -off klonopin  -1x seroquel 25mg  given 5/15 for agitation, trying to get out of bed.   -consider placing at nursing station if agitated 6. Skin/Wound Care:routine pressure relief measures. 7. Fluids/Electrolytes/Nutrition:good appetite  -f/u labs Monday 5/17 8. Agitation: At risk for falls.    - environmental mods  -continue propranolol-seems to have helped  -continue sleep chart, slept most of night 5/14-5/15  9. Hyperglycemia:  Resolved off tf  10. History of polysubstance abuse: Continue thiamine and folic acid.  11. Dysphagia: D2 thins for now  Eating well   -recent labs acceptable   14.  Hyponatremia  Sodium 133 on 4/30, up to 136 5/3, up to 138 on 5/10   LOS: 16 days A FACE TO FACE EVALUATION WAS PERFORMED  Patriciann Becht P Colleena Kurtenbach 09/03/2019, 5:59 PM     [place] PHYSICAL MEDICINE & REHABILITATION PROGRESS NOTE   Subjective/Complaints: Pt up in  bed eating breafkast. Appears comfortable. Slept most of night  ROS: Limited due to cognitive/behavioral     Objective:   No results found. No results for input(s): WBC, HGB, HCT, PLT in the last 72 hours. No results for input(s): NA, K, CL, CO2, GLUCOSE, BUN, CREATININE, CALCIUM in the last 72 hours.  Intake/Output Summary (Last 24 hours) at 09/03/2019 1759 Last data filed at 09/03/2019 1330 Gross per 24 hour  Intake 360 ml  Output --  Net 360 ml     Physical Exam: Vital Signs Blood pressure 116/72, pulse 72, temperature 97.6 F (36.4 C), temperature source Oral, resp. rate 18, height 5\' 7"  (1.702 m), weight 70.8 kg, SpO2 100 %.  Constitutional: No distress . Vital signs reviewed. HEENT: EOMI, oral membranes moist Neck: supple Cardiovascular: RRR without murmur. No JVD    Respiratory/Chest: CTA Bilaterally without wheezes or rales. Normal effort    GI/Abdomen: BS +, non-tender, non-distended Ext: no clubbing, cyanosis, or edema Psych: in good spirits. Musc: No edema in extremities.  No tenderness in extremities. Neuro: awake, followed basic commands. When asked questions of orientation he answered "over" repeatedly. Better  with spontaneous speech, expressive aphasia? AOx1 only Motor: moves all 4's spontaneously, senses pain  Assessment/Plan: 1. Functional deficits secondary to TBI which require 3+ hours per day of interdisciplinary therapy in a comprehensive inpatient rehab setting.  Physiatrist is providing close team supervision and 24 hour management of active medical problems listed below.  Physiatrist and rehab team continue to assess barriers to discharge/monitor patient progress toward functional and medical goals  Care Tool:  Bathing  Bathing activity did not occur: Refused Body parts bathed by patient: Right arm, Left arm, Chest, Abdomen, Front perineal area, Right upper leg, Left upper leg, Face, Right lower leg, Left lower leg   Body parts bathed by helper:  Buttocks     Bathing assist Assist Level: Minimal Assistance - Patient > 75%     Upper Body Dressing/Undressing Upper body dressing   What is the patient wearing?: Pull over shirt    Upper body assist Assist Level: Supervision/Verbal cueing    Lower Body Dressing/Undressing Lower body dressing      What is the patient wearing?: Pants     Lower body assist Assist for lower body dressing: Contact Guard/Touching assist     Toileting Toileting    Toileting assist Assist for toileting: Minimal Assistance - Patient > 75%     Transfers Chair/bed transfer  Transfers assist  Chair/bed transfer activity did not occur: Safety/medical concerns  Chair/bed transfer assist level: Minimal Assistance - Patient > 75%     Locomotion Ambulation   Ambulation assist   Ambulation activity did not occur: Refused(pt refused gait w/o AD)  Assist level: Moderate Assistance - Patient 50 - 74% Assistive device: Walker-rolling Max distance: 25'   Walk 10 feet activity   Assist  Walk 10 feet activity did not occur: Refused  Assist level: Moderate Assistance - Patient - 50 - 74% Assistive device: Walker-rolling   Walk 50 feet activity   Assist Walk 50 feet with 2 turns activity did not occur: (did not perform turning w/gait)  Assist level: Minimal Assistance - Patient > 75% Assistive device: Walker-rolling    Walk 150 feet activity   Assist           Walk 10 feet on uneven surface  activity   Assist Walk 10 feet on uneven surfaces activity did not occur: Armed forces operational officer activity did not occur: N/A         Wheelchair 50 feet with 2 turns activity    Assist    Wheelchair 50 feet with 2 turns activity did not occur: N/A       Wheelchair 150 feet activity     Assist  Wheelchair 150 feet activity did not occur: N/A       Blood pressure 116/72, pulse 72, temperature 97.6 F (36.4 C), temperature  source Oral, resp. rate 18, height 5\' 7"  (1.702 m), weight 70.8 kg, SpO2 100 %.  Medical Problem List and Plan: 1.Functional and cognitive deficitssecondary to TBI with skull fracture  Continue CIR  -RLAS V, slow progress likely linked to significant ETOH abuse, aphasic also 2. Antithrombotics: -DVT/anticoagulation:Pharmaceutical:Lovenox -antiplatelet therapy: N/a 3. Pain Management:Ultram and/or oxycodone prn.    -continue scheduled tylenol qid for now.Well controlled.  4. Mood:LCSW to follow for evaluation and support when appropriate. -antipsychotic agents: seroquel 5. Neuropsych: This patientis notcapable of making decisions on hisown behalf.  In regular bed. Low bed preferred.   RLAS  V   -dc klonopin  -wean hs klonopin also to limit day sedation while maintaining sleep pattern 6. Skin/Wound Care:routine pressure relief measures. 7. Fluids/Electrolytes/Nutrition:encourage PO 8. Agitation: At risk for falls.    - continue on hs klonopin 0.25mg  and seroquel 25mg  AM, nightly dose increased to 37.5   quite/controlled environment   -continue propranolol-seems to have helped  -sleep chart   9. Hyperglycemia:  Resolved off tf  10. History of polysubstance abuse: Continue thiamine and folic acid.  11. Dysphagia: D2 thins for now  Eating well   -recent labs acceptable   14.  Hyponatremia  Sodium 133 on 4/30, up to 136 5/3, up to 138 on 5/10 15. Disposition: SW sending SNF referrals  LOS: 16 days A FACE TO FACE EVALUATION WAS PERFORMED  Abi Shoults P Yanilen Adamik 09/03/2019, 5:59 PM

## 2019-09-03 NOTE — Progress Notes (Signed)
Patient getting up ; toileted patient and placed at nurses station.

## 2019-09-04 NOTE — Progress Notes (Signed)
[place] PHYSICAL MEDICINE & REHABILITATION PROGRESS NOTE   Subjective/Complaints: Sitting calmly at nursing station. Following simple commands.  Vitals stable   ROS: Limited due to cognitive/behavioral   Objective:   No results found. No results for input(s): WBC, HGB, HCT, PLT in the last 72 hours. No results for input(s): NA, K, CL, CO2, GLUCOSE, BUN, CREATININE, CALCIUM in the last 72 hours.  Intake/Output Summary (Last 24 hours) at 09/04/2019 1211 Last data filed at 09/04/2019 0830 Gross per 24 hour  Intake 600 ml  Output --  Net 600 ml     Physical Exam: Vital Signs Blood pressure 122/75, pulse 66, temperature 98.4 F (36.9 C), temperature source Oral, resp. rate 17, height 5\' 7"  (1.702 m), weight 72.7 kg, SpO2 100 %.  Constitutional: No distress . Vital signs reviewed. Sitting calmly at nursing station HEENT: EOMI, oral membranes moist Neck: supple Cardiovascular: RRR without murmur. No JVD    Respiratory/Chest: CTA Bilaterally without wheezes or rales. Normal effort    GI/Abdomen: BS +, non-tender, non-distended Ext: no clubbing, cyanosis, or edema Psych: cooperative, restless, non-agitated Psych: in good spirits. Musc: No edema in extremities.  No tenderness in extremities. Neuro: awake, followed basic commands. Better expressive language today but still with word finding deficits AOx1 only Motor: moves all 4's spontaneously, senses pain  Assessment/Plan: 1. Functional deficits secondary to TBI which require 3+ hours per day of interdisciplinary therapy in a comprehensive inpatient rehab setting.  Physiatrist is providing close team supervision and 24 hour management of active medical problems listed below.  Physiatrist and rehab team continue to assess barriers to discharge/monitor patient progress toward functional and medical goals  Care Tool:  Bathing  Bathing activity did not occur: Refused Body parts bathed by patient: Right arm, Left arm, Chest,  Abdomen, Front perineal area, Right upper leg, Left upper leg, Face, Right lower leg, Left lower leg   Body parts bathed by helper: Buttocks     Bathing assist Assist Level: Minimal Assistance - Patient > 75%     Upper Body Dressing/Undressing Upper body dressing   What is the patient wearing?: Pull over shirt    Upper body assist Assist Level: Supervision/Verbal cueing    Lower Body Dressing/Undressing Lower body dressing      What is the patient wearing?: Pants     Lower body assist Assist for lower body dressing: Contact Guard/Touching assist     Toileting Toileting    Toileting assist Assist for toileting: Minimal Assistance - Patient > 75%     Transfers Chair/bed transfer  Transfers assist  Chair/bed transfer activity did not occur: Safety/medical concerns  Chair/bed transfer assist level: Minimal Assistance - Patient > 75%     Locomotion Ambulation   Ambulation assist   Ambulation activity did not occur: Refused(pt refused gait w/o AD)  Assist level: Moderate Assistance - Patient 50 - 74% Assistive device: Walker-rolling Max distance: 25'   Walk 10 feet activity   Assist  Walk 10 feet activity did not occur: Refused  Assist level: Moderate Assistance - Patient - 50 - 74% Assistive device: Walker-rolling   Walk 50 feet activity   Assist Walk 50 feet with 2 turns activity did not occur: (did not perform turning w/gait)  Assist level: Minimal Assistance - Patient > 75% Assistive device: Walker-rolling    Walk 150 feet activity   Assist           Walk 10 feet on uneven surface  activity   Assist Walk 10  feet on uneven surfaces activity did not occur: Actor activity did not occur: N/A         Wheelchair 50 feet with 2 turns activity    Assist    Wheelchair 50 feet with 2 turns activity did not occur: N/A       Wheelchair 150 feet activity     Assist   Wheelchair 150 feet activity did not occur: N/A       Blood pressure 122/75, pulse 66, temperature 98.4 F (36.9 C), temperature source Oral, resp. rate 17, height 5\' 7"  (1.702 m), weight 72.7 kg, SpO2 100 %.  Medical Problem List and Plan: 1.Functional and cognitive deficitssecondary to TBI with skull fracture  Continue CIR  -RLAS V, slow progress likely linked to significant ETOH abuse, aphasic also  -5/14 spoke to sister at length yesterday and again today. They are unable to provide him consistent supervision at home. SNF pending 2. Antithrombotics: -DVT/anticoagulation:Pharmaceutical:Lovenox -antiplatelet therapy: N/a 3. Pain Management:Ultram and/or oxycodone prn.    -continue scheduled tylenol qid for now.well controlled  4. Mood:LCSW to follow for evaluation and support when appropriate. -antipsychotic agents: seroquel 5. Neuropsych: This patientis notcapable of making decisions on hisown behalf.  Regular bed now   RLAS V   -off klonopin  -1x seroquel 25mg  given 5/15 for agitation, trying to get out of bed.   -5/16: sitting calmly at nursing station, RN reports occasionally shouts at staff 6. Skin/Wound Care:routine pressure relief measures. 7. Fluids/Electrolytes/Nutrition:good appetite  -f/u labs Monday 5/17 8. Agitation: At risk for falls.    - environmental mods  -continue propranolol-seems to have helped  -continue sleep chart, slept most of night 5/14-5/15   See #5. Restraints renewed 9. Hyperglycemia:  Resolved off tf  10. History of polysubstance abuse: Continue thiamine and folic acid.  11. Dysphagia: D2 thins for now  Eating well   -recent labs acceptable   14.  Hyponatremia  Sodium 133 on 4/30, up to 136 5/3, up to 138 on 5/10   LOS: 17 days A FACE TO FACE EVALUATION WAS PERFORMED  5/30 P Timber Lucarelli 09/04/2019, 12:11 PM     [place] PHYSICAL MEDICINE & REHABILITATION PROGRESS  NOTE   Subjective/Complaints: Pt up in bed eating breafkast. Appears comfortable. Slept most of night  ROS: Limited due to cognitive/behavioral     Objective:   No results found. No results for input(s): WBC, HGB, HCT, PLT in the last 72 hours. No results for input(s): NA, K, CL, CO2, GLUCOSE, BUN, CREATININE, CALCIUM in the last 72 hours.  Intake/Output Summary (Last 24 hours) at 09/04/2019 1211 Last data filed at 09/04/2019 0830 Gross per 24 hour  Intake 600 ml  Output --  Net 600 ml     Physical Exam: Vital Signs Blood pressure 122/75, pulse 66, temperature 98.4 F (36.9 C), temperature source Oral, resp. rate 17, height 5\' 7"  (1.702 m), weight 72.7 kg, SpO2 100 %.  Constitutional: No distress . Vital signs reviewed. HEENT: EOMI, oral membranes moist Neck: supple Cardiovascular: RRR without murmur. No JVD    Respiratory/Chest: CTA Bilaterally without wheezes or rales. Normal effort    GI/Abdomen: BS +, non-tender, non-distended Ext: no clubbing, cyanosis, or edema Psych: in good spirits. Musc: No edema in extremities.  No tenderness in extremities. Neuro: awake, followed basic commands. When asked questions of orientation he answered "over" repeatedly. Better with spontaneous speech,  expressive aphasia? AOx1 only Motor: moves all 4's spontaneously, senses pain  Assessment/Plan: 1. Functional deficits secondary to TBI which require 3+ hours per day of interdisciplinary therapy in a comprehensive inpatient rehab setting.  Physiatrist is providing close team supervision and 24 hour management of active medical problems listed below.  Physiatrist and rehab team continue to assess barriers to discharge/monitor patient progress toward functional and medical goals  Care Tool:  Bathing  Bathing activity did not occur: Refused Body parts bathed by patient: Right arm, Left arm, Chest, Abdomen, Front perineal area, Right upper leg, Left upper leg, Face, Right lower leg, Left  lower leg   Body parts bathed by helper: Buttocks     Bathing assist Assist Level: Minimal Assistance - Patient > 75%     Upper Body Dressing/Undressing Upper body dressing   What is the patient wearing?: Pull over shirt    Upper body assist Assist Level: Supervision/Verbal cueing    Lower Body Dressing/Undressing Lower body dressing      What is the patient wearing?: Pants     Lower body assist Assist for lower body dressing: Contact Guard/Touching assist     Toileting Toileting    Toileting assist Assist for toileting: Minimal Assistance - Patient > 75%     Transfers Chair/bed transfer  Transfers assist  Chair/bed transfer activity did not occur: Safety/medical concerns  Chair/bed transfer assist level: Minimal Assistance - Patient > 75%     Locomotion Ambulation   Ambulation assist   Ambulation activity did not occur: Refused(pt refused gait w/o AD)  Assist level: Moderate Assistance - Patient 50 - 74% Assistive device: Walker-rolling Max distance: 25'   Walk 10 feet activity   Assist  Walk 10 feet activity did not occur: Refused  Assist level: Moderate Assistance - Patient - 50 - 74% Assistive device: Walker-rolling   Walk 50 feet activity   Assist Walk 50 feet with 2 turns activity did not occur: (did not perform turning w/gait)  Assist level: Minimal Assistance - Patient > 75% Assistive device: Walker-rolling    Walk 150 feet activity   Assist           Walk 10 feet on uneven surface  activity   Assist Walk 10 feet on uneven surfaces activity did not occur: Actor activity did not occur: N/A         Wheelchair 50 feet with 2 turns activity    Assist    Wheelchair 50 feet with 2 turns activity did not occur: N/A       Wheelchair 150 feet activity     Assist  Wheelchair 150 feet activity did not occur: N/A       Blood pressure 122/75, pulse 66,  temperature 98.4 F (36.9 C), temperature source Oral, resp. rate 17, height 5\' 7"  (1.702 m), weight 72.7 kg, SpO2 100 %.  Medical Problem List and Plan: 1.Functional and cognitive deficitssecondary to TBI with skull fracture  Continue CIR  -RLAS V, slow progress likely linked to significant ETOH abuse, aphasic also 2. Antithrombotics: -DVT/anticoagulation:Pharmaceutical:Lovenox -antiplatelet therapy: N/a 3. Pain Management:Ultram and/or oxycodone prn.    -continue scheduled tylenol qid for now.Well controlled.  4. Mood:LCSW to follow for evaluation and support when appropriate. -antipsychotic agents: seroquel 5. Neuropsych: This patientis notcapable of making decisions on hisown behalf.  In regular bed. Low bed preferred.   RLAS V   -  dc klonopin  -wean hs klonopin also to limit day sedation while maintaining sleep pattern 6. Skin/Wound Care:routine pressure relief measures. 7. Fluids/Electrolytes/Nutrition:encourage PO 8. Agitation: At risk for falls.    - continue on hs klonopin 0.25mg  and seroquel 25mg  AM, nightly dose increased to 37.5   quite/controlled environment   -continue propranolol-seems to have helped  -sleep chart   9. Hyperglycemia:  Resolved off tf  10. History of polysubstance abuse: Continue thiamine and folic acid.  11. Dysphagia: D2 thins for now  Eating well   -recent labs acceptable   14.  Hyponatremia  Sodium 133 on 4/30, up to 136 5/3, up to 138 on 5/10 15. Disposition: SW sending SNF referrals  LOS: 17 days A FACE TO FACE EVALUATION WAS PERFORMED  7/10 P Lux Skilton 09/04/2019, 12:11 PM

## 2019-09-04 NOTE — Progress Notes (Signed)
Patient is calm today stayed most at nurses station; sister was here to visit today; patient ate dinner and went to bed after.

## 2019-09-04 NOTE — Progress Notes (Signed)
Patient refused scheduled Lovenox (20:00). Patient educated on purpose and benefits of medication and still refused.

## 2019-09-04 NOTE — Plan of Care (Signed)
  Problem: [facility] SAFETY Goal: RH STG ADHERE TO SAFETY PRECAUTIONS W/ASSISTANCE/DEVICE Description: STG Adhere to Safety Precautions With min/mod Assistance/Device. Outcome: Not Progressing; telesitter   Problem: [facility] KNOWLEDGE DEFICIT BRAIN INJURY Goal: RH STG INCREASE KNOWLEDGE OF SELF CARE AFTER BRAIN INJURY Description: With min/mod assist Outcome: Not Progressing; TBI   Problem: [facility] KNOWLEDGE DEFICIT BRAIN INJURY Goal: RH STG INCREASE KNOWLEDGE OF DYSPHAGIA/FLUID INTAKE Description: With min/mod assist Outcome: Not Progressing; TBI

## 2019-09-04 NOTE — Progress Notes (Addendum)
Patient got up wanting to go to bathroom ; ate breakfast at nurses station ; able to let staff know if he needs to go to bathroom he needs a lot of cueing and needs 2 staff to be with him in bathroom for safety; patient can be impatient at times and shouts at staff; able to stay at the nurses station.

## 2019-09-05 ENCOUNTER — Inpatient Hospital Stay (HOSPITAL_COMMUNITY): Payer: Medicare Other | Admitting: Speech Pathology

## 2019-09-05 ENCOUNTER — Inpatient Hospital Stay (HOSPITAL_COMMUNITY): Payer: Medicare Other | Admitting: Physical Therapy

## 2019-09-05 ENCOUNTER — Inpatient Hospital Stay (HOSPITAL_COMMUNITY): Payer: Medicare Other | Admitting: Occupational Therapy

## 2019-09-05 LAB — CBC
HCT: 40.2 % (ref 39.0–52.0)
Hemoglobin: 13.2 g/dL (ref 13.0–17.0)
MCH: 32.7 pg (ref 26.0–34.0)
MCHC: 32.8 g/dL (ref 30.0–36.0)
MCV: 99.5 fL (ref 80.0–100.0)
Platelets: 211 10*3/uL (ref 150–400)
RBC: 4.04 MIL/uL — ABNORMAL LOW (ref 4.22–5.81)
RDW: 11.9 % (ref 11.5–15.5)
WBC: 3.6 10*3/uL — ABNORMAL LOW (ref 4.0–10.5)
nRBC: 0 % (ref 0.0–0.2)

## 2019-09-05 LAB — BASIC METABOLIC PANEL
Anion gap: 6 (ref 5–15)
BUN: 11 mg/dL (ref 8–23)
CO2: 29 mmol/L (ref 22–32)
Calcium: 9.6 mg/dL (ref 8.9–10.3)
Chloride: 103 mmol/L (ref 98–111)
Creatinine, Ser: 1.1 mg/dL (ref 0.61–1.24)
GFR calc Af Amer: >60 mL/min (ref >60)
GFR calc non Af Amer: >60 mL/min (ref >60)
Glucose, Bld: 106 mg/dL — ABNORMAL HIGH (ref 70–99)
Potassium: 4.4 mmol/L (ref 3.5–5.1)
Sodium: 138 mmol/L (ref 135–145)

## 2019-09-05 MED ORDER — QUETIAPINE FUMARATE 50 MG PO TABS
50.0000 mg | ORAL_TABLET | Freq: Every day | ORAL | Status: DC
  Administered 2019-09-05 – 2019-09-21 (×14): 50 mg via ORAL
  Filled 2019-09-05 (×19): qty 1

## 2019-09-05 NOTE — Progress Notes (Signed)
Occupational Therapy Note  Patient Details  Name: Jeremiah West MRN: [id number] Date of Birth: 08-15-1950  Today's Date: 09/05/2019 OT Missed Time: 33 Minutes Missed Time Reason: Patient unwilling/refused to participate without medical reason  Pt greeted supine in bed with eyes closed. Pt easy to wake, but refused to participate in OT. Pt stating he was tired and he was not going to get back up. Unable to convince pt to participate. Pt left semi-reclined in bed with needs met and alarm on.    Jeremiah West 09/05/2019, 1:14 PM

## 2019-09-05 NOTE — Progress Notes (Addendum)
Patient ID: Jeremiah West, male   DOB: 05-21-1950, 69 y.o.   MRN: [id number]    09/02/2019- Referrals sent out to [facility], [place], and [facility]. SW waiting on follow-up. SW spoke with Tanya/Admissions with [facility] and was informed unable to take TBI.  09/05/2019- Referrals sent out to further counties to see about bed offers. SW left message for Admissions with [facility] [phone]) to follow-up about offer. SW waiting on follow-up.  *referral declined due to behaviors.   SW spoke with Laurie/[facility] liaison [phone]) can extend a bed offer at [facility] (near [place]). Waiting on follow-up from family.   Cecile Sheerer, MSW, LCSWA Office: [phone] Cell: [phone] Fax: [phone]

## 2019-09-05 NOTE — Plan of Care (Signed)
  Problem: Consults Goal: RH BRAIN INJURY PATIENT EDUCATION Description: Description: See Patient Education module for eduction specifics Outcome: Progressing Goal: Nutrition Consult-if indicated Outcome: Progressing Goal: Diabetes Guidelines if Diabetic/Glucose > 140 Description: If diabetic or lab glucose is > 140 mg/dl - Initiate Diabetes/Hyperglycemia Guidelines & Document Interventions  Outcome: Progressing   Problem: RH BOWEL ELIMINATION Goal: RH STG MANAGE BOWEL WITH ASSISTANCE Description: STG Manage Bowel with min Assistance. Outcome: Progressing   Problem: RH BLADDER ELIMINATION Goal: RH STG MANAGE BLADDER WITH ASSISTANCE Description: STG Manage Bladder With min Assistance Outcome: Progressing   Problem: RH SAFETY Goal: RH STG ADHERE TO SAFETY PRECAUTIONS W/ASSISTANCE/DEVICE Description: STG Adhere to Safety Precautions With min/mod Assistance/Device. Outcome: Not Progressing   Problem: RH COGNITION-NURSING Goal: RH STG USES MEMORY AIDS/STRATEGIES W/ASSIST TO PROBLEM SOLVE Description: STG Uses Memory Aids/Strategies With min/mod Assistance to Problem Solve. Outcome: Not Progressing   Problem: RH KNOWLEDGE DEFICIT BRAIN INJURY Goal: RH STG INCREASE KNOWLEDGE OF SELF CARE AFTER BRAIN INJURY Description: With min/mod assist Outcome: Not Progressing Goal: RH STG INCREASE KNOWLEDGE OF DYSPHAGIA/FLUID INTAKE Description: With min/mod assist Outcome: Not Progressing  Patient continues to behave with impulsive behavior when it comes to safety. Patient needs to be redirected many times due to increased forgetfulness. Patient is only alert to self and unaware that he is in the hospital or has a room. Patient continues to refuse multiple care efforts.

## 2019-09-05 NOTE — Progress Notes (Signed)
Speech Language Pathology Daily Session Note  Patient Details  Name: Jeremiah West MRN: [id number] Date of Birth: 1950/07/05  Today's Date: 09/05/2019 SLP Individual Time: 9326-7124 SLP Individual Time Calculation (min): 30 min  Short Term Goals: Week 3: SLP Short Term Goal 1 (Week 3): Patient will demonstrate sustained attention to functional tasks for 15 minutes with Mod verbal cues for redirection. SLP Short Term Goal 2 (Week 3): Patient will demonstrate orientation X 4 with Max A multimodal cues. SLP Short Term Goal 3 (Week 3): Patient will demonstrate basic problem solving for basic and familiar tasks with Mod A verbal and visual cues. SLP Short Term Goal 4 (Week 3): Patient will verbalize basic wants/needs at the phrase level with Min verbal cues. SLP Short Term Goal 5 (Week 3): Patient will consume current diet with minimal overt s/s of aspiration with Mod verbal cues use of swallowing compensatory strategies. SLP Short Term Goal 6 (Week 3): Patient will demonstrate efficient mastication and complete oral clerance with trials of Dys. 3 textures over 2 sessions without overt s/s of aspiration and Mod verbal cues.  Skilled Therapeutic Interventions: Skilled treatment session focused on dysphagia and cognitive goals. Upon arrival, patient was awake in bed. Patient agreeable to sit EOB with extra time. Patient only had his brief on, therefore, SLP facilitated session by providing Max A verbal cues for problem solving while donning pants/shirt. Patient consumed lunch meal of Dys. 2 textures with thin liquids without overt s/s of aspiration but demonstrated prolonged mastication with mild oral residue with extremely large bites. Recommend continued full supervision with meals. Patient verbalized need to use the bathroom and was able to void with Mod verbal and tactile cues for problem solving. Patient left supine in bed with alarm on and all needs within reach. Continue with current plan of care.       Pain Pain Assessment Pain Scale: 0-10 Pain Score: 0-No pain  Therapy/Group: Individual Therapy  Lucca Ballo 09/05/2019, 2:43 PM

## 2019-09-05 NOTE — Progress Notes (Signed)
[place] PHYSICAL MEDICINE & REHABILITATION PROGRESS NOTE   Subjective/Complaints: No new issues. Eating breakfast in room  ROS: Limited due to cognitive/behavioral     Objective:   No results found. Recent Labs    09/05/19 0524  WBC 3.6*  HGB 13.2  HCT 40.2  PLT 211   Recent Labs    09/05/19 0524  NA 138  K 4.4  CL 103  CO2 29  GLUCOSE 106*  BUN 11  CREATININE 1.10  CALCIUM 9.6    Intake/Output Summary (Last 24 hours) at 09/05/2019 1335 Last data filed at 09/05/2019 0848 Gross per 24 hour  Intake 240 ml  Output 100 ml  Net 140 ml     Physical Exam: Vital Signs Blood pressure 108/60, pulse 71, temperature 98.2 F (36.8 C), resp. rate 19, height 5\' 7"  (1.702 m), weight 72.7 kg, SpO2 100 %.  Constitutional: No distress . Vital signs reviewed. HEENT: EOMI, oral membranes moist Neck: supple Cardiovascular: RRR without murmur. No JVD    Respiratory/Chest: CTA Bilaterally without wheezes or rales. Normal effort    GI/Abdomen: BS +, non-tender, non-distended Ext: no clubbing, cyanosis, or edema Psych: pleasant and cooperative for the most part.  Musc: No edema in extremities.  No tenderness in extremities. Neuro: awake, followed basic commands. Word finding deficits. AOx1 only Motor: moves all 4's spontaneously, senses pain  Assessment/Plan: 1. Functional deficits secondary to TBI which require 3+ hours per day of interdisciplinary therapy in a comprehensive inpatient rehab setting.  Physiatrist is providing close team supervision and 24 hour management of active medical problems listed below.  Physiatrist and rehab team continue to assess barriers to discharge/monitor patient progress toward functional and medical goals  Care Tool:  Bathing  Bathing activity did not occur: Refused Body parts bathed by patient: Right arm, Left arm, Chest, Abdomen, Front perineal area, Right upper leg, Left upper leg, Face, Right lower leg, Left lower leg   Body parts  bathed by helper: Buttocks     Bathing assist Assist Level: Minimal Assistance - Patient > 75%     Upper Body Dressing/Undressing Upper body dressing   What is the patient wearing?: Pull over shirt    Upper body assist Assist Level: Supervision/Verbal cueing    Lower Body Dressing/Undressing Lower body dressing      What is the patient wearing?: Pants     Lower body assist Assist for lower body dressing: Contact Guard/Touching assist     Toileting Toileting    Toileting assist Assist for toileting: Minimal Assistance - Patient > 75%     Transfers Chair/bed transfer  Transfers assist  Chair/bed transfer activity did not occur: Safety/medical concerns  Chair/bed transfer assist level: Minimal Assistance - Patient > 75%     Locomotion Ambulation   Ambulation assist   Ambulation activity did not occur: Refused(pt refused gait w/o AD)  Assist level: Moderate Assistance - Patient 50 - 74% Assistive device: Walker-rolling Max distance: 25'   Walk 10 feet activity   Assist  Walk 10 feet activity did not occur: Refused  Assist level: Moderate Assistance - Patient - 50 - 74% Assistive device: Walker-rolling   Walk 50 feet activity   Assist Walk 50 feet with 2 turns activity did not occur: (did not perform turning w/gait)  Assist level: Minimal Assistance - Patient > 75% Assistive device: Walker-rolling    Walk 150 feet activity   Assist           Walk 10 feet on uneven  surface  activity   Assist Walk 10 feet on uneven surfaces activity did not occur: Actor activity did not occur: N/A         Wheelchair 50 feet with 2 turns activity    Assist    Wheelchair 50 feet with 2 turns activity did not occur: N/A       Wheelchair 150 feet activity     Assist  Wheelchair 150 feet activity did not occur: N/A       Blood pressure 108/60, pulse 71, temperature 98.2 F (36.8  C), resp. rate 19, height 5\' 7"  (1.702 m), weight 72.7 kg, SpO2 100 %.  Medical Problem List and Plan: 1.Functional and cognitive deficitssecondary to TBI with skull fracture  Continue CIR  -RLAS V, slow progress likely linked to significant ETOH abuse, aphasic also  -5/14  SNF pending given ongoing needs 2. Antithrombotics: -DVT/anticoagulation:Pharmaceutical:Lovenox -antiplatelet therapy: N/a 3. Pain Management:Ultram and/or oxycodone prn.    -continue scheduled tylenol qid for now.well controlled  4. Mood:LCSW to follow for evaluation and support when appropriate. -antipsychotic agents: seroquel 5. Neuropsych: This patientis notcapable of making decisions on hisown behalf.  Regular bed now   RLAS V   -off klonopin  -1x seroquel 25mg  given 5/15 for agitation, trying to get out of bed.   -5/16: sitting calmly at nursing station, RN reports occasionally shouts at staff  5/17: given night time activity, will increase seroquel back to 50mg  at bedtime 6. Skin/Wound Care:routine pressure relief measures. 7. Fluids/Electrolytes/Nutrition:good appetite  -f/u labs today all reviewed 8. Agitation: At risk for falls.    - environmental mods  -continue propranolol-seems to have helped  -continue sleep chart, some hs agitation, increase seroquel as above   Telesitter.  9. Hyperglycemia:  Resolved off tf  10. History of polysubstance abuse: Continue thiamine and folic acid.  11. Dysphagia: D2 thins for now  Eating well   -today's labs acceptable   14.  Hyponatremia  Sodium 133 5/17   LOS: 18 days A FACE TO FACE EVALUATION WAS PERFORMED  6/17 09/05/2019, 1:35 PM     [place] PHYSICAL MEDICINE & REHABILITATION PROGRESS NOTE   Subjective/Complaints: Pt up in bed eating breafkast. Appears comfortable. Slept most of night  ROS: Limited due to cognitive/behavioral     Objective:   No results found. Recent Labs     09/05/19 0524  WBC 3.6*  HGB 13.2  HCT 40.2  PLT 211   Recent Labs    09/05/19 0524  NA 138  K 4.4  CL 103  CO2 29  GLUCOSE 106*  BUN 11  CREATININE 1.10  CALCIUM 9.6    Intake/Output Summary (Last 24 hours) at 09/05/2019 1335 Last data filed at 09/05/2019 0848 Gross per 24 hour  Intake 240 ml  Output 100 ml  Net 140 ml     Physical Exam: Vital Signs Blood pressure 108/60, pulse 71, temperature 98.2 F (36.8 C), resp. rate 19, height 5\' 7"  (1.702 m), weight 72.7 kg, SpO2 100 %.  Constitutional: No distress . Vital signs reviewed. HEENT: EOMI, oral membranes moist Neck: supple Cardiovascular: RRR without murmur. No JVD    Respiratory/Chest: CTA Bilaterally without wheezes or rales. Normal effort    GI/Abdomen: BS +, non-tender, non-distended Ext: no clubbing, cyanosis, or edema Psych: in good spirits. Musc: No edema in extremities.  No tenderness in extremities. Neuro:  awake, followed basic commands. When asked questions of orientation he answered "over" repeatedly. Better with spontaneous speech, expressive aphasia? AOx1 only Motor: moves all 4's spontaneously, senses pain  Assessment/Plan: 1. Functional deficits secondary to TBI which require 3+ hours per day of interdisciplinary therapy in a comprehensive inpatient rehab setting.  Physiatrist is providing close team supervision and 24 hour management of active medical problems listed below.  Physiatrist and rehab team continue to assess barriers to discharge/monitor patient progress toward functional and medical goals  Care Tool:  Bathing  Bathing activity did not occur: Refused Body parts bathed by patient: Right arm, Left arm, Chest, Abdomen, Front perineal area, Right upper leg, Left upper leg, Face, Right lower leg, Left lower leg   Body parts bathed by helper: Buttocks     Bathing assist Assist Level: Minimal Assistance - Patient > 75%     Upper Body Dressing/Undressing Upper body dressing   What  is the patient wearing?: Pull over shirt    Upper body assist Assist Level: Supervision/Verbal cueing    Lower Body Dressing/Undressing Lower body dressing      What is the patient wearing?: Pants     Lower body assist Assist for lower body dressing: Contact Guard/Touching assist     Toileting Toileting    Toileting assist Assist for toileting: Minimal Assistance - Patient > 75%     Transfers Chair/bed transfer  Transfers assist  Chair/bed transfer activity did not occur: Safety/medical concerns  Chair/bed transfer assist level: Minimal Assistance - Patient > 75%     Locomotion Ambulation   Ambulation assist   Ambulation activity did not occur: Refused(pt refused gait w/o AD)  Assist level: Moderate Assistance - Patient 50 - 74% Assistive device: Walker-rolling Max distance: 25'   Walk 10 feet activity   Assist  Walk 10 feet activity did not occur: Refused  Assist level: Moderate Assistance - Patient - 50 - 74% Assistive device: Walker-rolling   Walk 50 feet activity   Assist Walk 50 feet with 2 turns activity did not occur: (did not perform turning w/gait)  Assist level: Minimal Assistance - Patient > 75% Assistive device: Walker-rolling    Walk 150 feet activity   Assist           Walk 10 feet on uneven surface  activity   Assist Walk 10 feet on uneven surfaces activity did not occur: Armed forces operational officer activity did not occur: N/A         Wheelchair 50 feet with 2 turns activity    Assist    Wheelchair 50 feet with 2 turns activity did not occur: N/A       Wheelchair 150 feet activity     Assist  Wheelchair 150 feet activity did not occur: N/A       Blood pressure 108/60, pulse 71, temperature 98.2 F (36.8 C), resp. rate 19, height 5\' 7"  (1.702 m), weight 72.7 kg, SpO2 100 %.  Medical Problem List and Plan: 1.Functional and cognitive deficitssecondary to TBI  with skull fracture  Continue CIR  -RLAS V, slow progress likely linked to significant ETOH abuse, aphasic also 2. Antithrombotics: -DVT/anticoagulation:Pharmaceutical:Lovenox -antiplatelet therapy: N/a 3. Pain Management:Ultram and/or oxycodone prn.    -continue scheduled tylenol qid for now.Well controlled.  4. Mood:LCSW to follow for evaluation and support when appropriate. -antipsychotic agents: seroquel 5. Neuropsych: This patientis notcapable of making decisions on hisown  behalf.  In regular bed. Low bed preferred.   RLAS V   -dc klonopin  -wean hs klonopin also to limit day sedation while maintaining sleep pattern 6. Skin/Wound Care:routine pressure relief measures. 7. Fluids/Electrolytes/Nutrition:encourage PO 8. Agitation: At risk for falls.    - continue on hs klonopin 0.25mg  and seroquel 25mg  AM, nightly dose increased to 37.5   quite/controlled environment   -continue propranolol-seems to have helped  -sleep chart   9. Hyperglycemia:  Resolved off tf  10. History of polysubstance abuse: Continue thiamine and folic acid.  11. Dysphagia: D2 thins for now  Eating well   -recent labs acceptable   14.  Hyponatremia  Sodium 133 on 4/30, up to 136 5/3, up to 138 on 5/10 15. Disposition: SW sending SNF referrals  LOS: 18 days A FACE TO FACE EVALUATION WAS PERFORMED  7/10 09/05/2019, 1:35 PM

## 2019-09-05 NOTE — Progress Notes (Signed)
Physical Therapy Session Note  Patient Details  Name: Jeremiah West MRN: [id number] Date of Birth: [birth year]  Today's Date: 09/05/2019 PT Missed Time: 30 Minutes Missed Time Reason: Patient unwilling to participate  Pt asleep upon arrival, easily woken up and noted to not have any clothing on. Pt agreeable to don clothes. Therapist retrieved paper scrubs and upon return, pt refusing to get up. Therapist stated she returned w/ clothing and pt yelled "no" and pulled covers over himself. Missed 30 min of skilled PT 2/2 refusal.   Alycen Mack K Jinan Biggins 09/05/2019, 2:37 PM

## 2019-09-06 ENCOUNTER — Inpatient Hospital Stay (HOSPITAL_COMMUNITY): Payer: Medicare Other | Admitting: Occupational Therapy

## 2019-09-06 ENCOUNTER — Inpatient Hospital Stay (HOSPITAL_COMMUNITY): Payer: Medicare Other | Admitting: Physical Therapy

## 2019-09-06 ENCOUNTER — Inpatient Hospital Stay (HOSPITAL_COMMUNITY): Payer: Medicare Other | Admitting: Speech Pathology

## 2019-09-06 NOTE — Progress Notes (Signed)
Occupational Therapy Session Note  Patient Details  Name: Jeremiah West MRN: [id number] Date of Birth: [birth year]  Today's Date: 09/06/2019 OT Individual Time: 1500-1530 OT Individual Time Calculation (min): 30 min    Short Term Goals: Week 1:  OT Short Term Goal 1 (Week 1): Pt will consistently transfer to toilet wiht MIN A OT Short Term Goal 1 - Progress (Week 1): Met OT Short Term Goal 2 (Week 1): Pt will thread BLE into pants demoing improved dressing praxis OT Short Term Goal 2 - Progress (Week 1): Met OT Short Term Goal 3 (Week 1): Pt will stand to groom with MIN A and MOD VC for sequncing OT Short Term Goal 3 - Progress (Week 1): Met OT Short Term Goal 4 (Week 1): Pt will bathe UB with MOD VC for initation OT Short Term Goal 4 - Progress (Week 1): Met  Skilled Therapeutic Interventions/Progress Updates:    Patient in bed, pleasant and cooperative.  Supine to sitting with CGA.  Sit to stand and ambulation with RW CGA/min A for walker management and orientation to body position, cues for upright posture.  Completed ball bounce pass with good UB coordination in stance - CGA/min a for balance in stance.  Completed bean bag toss in stance x 3 - CGA for balance difficulty with reaching target (likely depth perceptual impairment due to monocular vision).  Ambulation activity around cones - mod a for walker management and posture.  Returned to bed via walker with CGA/min A.  Sitting to supine with CS.  He is able to use calendar and written cues in room with max cues to answer questions related to date and place.  Bed alarm set and call bell in hand at close of session.    Therapy Documentation Precautions:  Precautions Precautions: Fall Precaution Comments: Soft waist Posey belt restraint, B wrist restraints Restrictions Weight Bearing Restrictions: No General:   Vital Signs: Therapy Vitals Temp: 98.6 F (37 C) Temp Source: Oral Pulse Rate: 87 Resp: 16 BP: (!) 91/44 Patient  Position (if appropriate): Lying Oxygen Therapy SpO2: 99 % O2 Device: Room Air Pain: Pain Assessment Pain Scale: 0-10 Pain Score: 0-No pain   Therapy/Group: Individual Therapy  Carlos Levering 09/06/2019, 3:39 PM
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
## 2019-09-06 NOTE — Progress Notes (Addendum)
Physical Therapy Session Note  Patient Details  Name: Jeremiah West MRN: [id number] Date of Birth: 1950/07/10  Today's Date: 09/06/2019 PT Individual Time: 1345-1430 PT Individual Time Calculation (min): 45 min   Short Term Goals: Week 3:  PT Short Term Goal 1 (Week 3): Pt will initiate functional tasks w/ min cues 100% of the time PT Short Term Goal 2 (Week 3): Pt will ambulate 23' w/ min assist w/ LRAD consistently PT Short Term Goal 3 (Week 3): Pt will maintain selective attention to functional tasks in controlled environment w/ min assist  Skilled Therapeutic Interventions/Progress Updates:   Pt in w/c and agreeable to therapy, no c/o pain throughout session. Pt alert and cooperative this session. He did question where he was going and why. Total assist w/c transport to/from therapy gym. Ambulated 62' w/ RW, min assist. Improved speed and RW management however continues to have flexed trunk posture. Practiced negotiating 4 steps w/ B rails for community access and ambulated 60' back to w/c w/ min assist using RW. Returned to room via w/c. Ambulated into bathroom w/ min assist and performed bathing from seated level on tub bench. Pt bathed w/ min assist overall, needing assist to bathe back and buttocks. Max verbal and tactile cues for sequencing as he was perseverating on bathing feet and distal LEs. Min assist sit<>stands using grab bar during bathing and min assist stand pivot to w/c. Pt donned shirt w/ supervision, threaded pants w/ min assist, max assist to pull up pants over hips in standing. Pt reported he needed to toilet. Performed toilet transfer w/ min assist. Pt continent of bowel, total assist for LE garment management and pericare for time management. Pt ended session in w/c and under direct supervision of RN at nurses station, all needs met.   Therapy Documentation Precautions:  Precautions Precautions: Fall Precaution Comments: fall risk. no longer in need of  restraints Restrictions Weight Bearing Restrictions: No Vital Signs: Therapy Vitals Temp: 98.6 F (37 C) Temp Source: Oral Pulse Rate: 87 Resp: 16 BP: (!) 91/44 Patient Position (if appropriate): Lying Oxygen Therapy SpO2: 99 % O2 Device: Room Air Pain: Pain Assessment Pain Scale: 0-10 Pain Score: 0-No pain   Therapy/Group: Individual Therapy  Bryon Parker [name] 09/06/2019, 2:45 PM

## 2019-09-06 NOTE — Progress Notes (Signed)
Speech Language Pathology Daily Session Note  Patient Details  Name: GIUSEPPE DUCHEMIN MRN: [id number] Date of Birth: 05/31/1950  Today's Date: 09/06/2019 SLP Individual Time: 7673-4193 SLP Individual Time Calculation (min): 15 min  Short Term Goals: Week 3: SLP Short Term Goal 1 (Week 3): Patient will demonstrate sustained attention to functional tasks for 15 minutes with Mod verbal cues for redirection. SLP Short Term Goal 2 (Week 3): Patient will demonstrate orientation X 4 with Max A multimodal cues. SLP Short Term Goal 3 (Week 3): Patient will demonstrate basic problem solving for basic and familiar tasks with Mod A verbal and visual cues. SLP Short Term Goal 4 (Week 3): Patient will verbalize basic wants/needs at the phrase level with Min verbal cues. SLP Short Term Goal 5 (Week 3): Patient will consume current diet with minimal overt s/s of aspiration with Mod verbal cues use of swallowing compensatory strategies. SLP Short Term Goal 6 (Week 3): Patient will demonstrate efficient mastication and complete oral clerance with trials of Dys. 3 textures over 2 sessions without overt s/s of aspiration and Mod verbal cues.  Skilled Therapeutic Interventions: Pt was seen for skilled ST targeting cognition. Pt minimally engaged today, despite Max A encouragement. He was agreeable to activities laying in bed, including making a phone call to his sister. Overall Mod A verbal and visual cues (including step by step directions) require for pt to successfully problem solve use of telephone to make a phone call. He used aids in his room to orient himself to place with a verbal cue to locate signage (although he originally stated he was in a motel). Pt refused to participate further, despite Max encouragement and ultimately session ended 15 mins early. Pt left laying in bed with alarm set and needs within reach, telesitter active. Continue per current plan of care.        Pain Pain Assessment Pain Scale:  0-10 Pain Score: 0-No pain  Therapy/Group: Individual Therapy  [name] 09/06/2019, 7:12 AM

## 2019-09-06 NOTE — Progress Notes (Signed)
Nutrition Follow-up  DOCUMENTATION CODES:   Non-severe (moderate) malnutrition in context of chronic illness  INTERVENTION:   - Continue MVI with minerals daily  -ContinueEnsure Enlive poTID, each supplement provides 350 kcal and 20 grams of protein  NUTRITION DIAGNOSIS:   Moderate Malnutrition related to chronic illness (chronic medical issues and/or ETOH abuse) as evidenced by mild fat depletion, moderate muscle depletion.  Ongoing, being addressed via supplements  GOAL:   Patient will meet greater than or equal to 90% of their needs  Progressing  MONITOR:   PO intake, Supplement acceptance, Labs, Weight trends  REASON FOR ASSESSMENT:   Consult Enteral/tube feeding initiation and management, Calorie Count  ASSESSMENT:   69 year old male with PMH of depression, T2DM, polysubstance abuse, daily EtOH use. Pt was admitted after being found at the base of 16 stairs with slurred speech. Pt was found to have left frontal and temporal SDH, small left temporal SAH, nondisplaced fracture of right temporal calvarium extending to right mastoid and right mastoid effusion with hemorrhage. Other incidental findings include moderate right and small left hydrocele, contusion right hip, emphysema, multiple remote bilateral rib fractures, volume overload with interstitial edema, and calcified pleural plaques compatible with prior asbestos exposure. Pt is tolerating POs but tube feeds remain in place. Pt has had issues with fluctuating mental status with agitation and confusion requiring restraints. Admitted to CIR on 4/29.  5/02 - Cortrak removed  SNF pending.  Spoke with pt at bedside. Pt reports that he is drinking the Ensure supplements. Pt states he ate "some" today but isn't very hungry. Will continue with current plan of care.  Spoke with RN who reports pt not eating well today. RN reports that pt has some days where he does not eat well and other days where he does and that it  is more of a behavioral issue rather than due to a poor appetite. RN reports pt also refusing some medications. RN confirms pt is taking Ensure Enlive supplements.  Weight stable compared to admit weight.  Meal Completion: 0-100% x last 8 meals (extremely variable)  Medications reviewed and include: colace, Ensure Enlive TID, folic acid, MVI with minerals, miralax, thiamine  Labs reviewed.  Diet Order:   Diet Order            DIET DYS 2 Room service appropriate? Yes; Fluid consistency: Thin  Diet effective now              EDUCATION NEEDS:   Not appropriate for education at this time  Skin:  Skin Assessment: Reviewed RN Assessment  Last BM:  08/30/19  Height:   Ht Readings from Last 1 Encounters:  08/18/19 5\' 7"  (1.702 m)    Weight:   Wt Readings from Last 1 Encounters:  09/06/19 70.7 kg    Ideal Body Weight:  67.3 kg  BMI:  Body mass index is 24.41 kg/m.  Estimated Nutritional Needs:   Kcal:  2000-2200  Protein:  100-115 grams  Fluid:  >/= 2.0 L    09/08/19, MS, RD, LDN Inpatient Clinical Dietitian Pager: [phone] Weekend/After Hours: [phone]

## 2019-09-06 NOTE — Progress Notes (Signed)
[facility] PHYSICAL MEDICINE & REHABILITATION PROGRESS NOTE   Subjective/Complaints: Pt lying in bed. Slept fairly well by sleep chart. Still not cooperating consistently with therapies  ROS: Limited due to cognitive/behavioral     Objective:   No results found. Recent Labs    09/05/19 0524  WBC 3.6*  HGB 13.2  HCT 40.2  PLT 211   Recent Labs    09/05/19 0524  NA 138  K 4.4  CL 103  CO2 29  GLUCOSE 106*  BUN 11  CREATININE 1.10  CALCIUM 9.6    Intake/Output Summary (Last 24 hours) at 09/06/2019 1106 Last data filed at 09/06/2019 1751 Gross per 24 hour  Intake 320 ml  Output --  Net 320 ml     Physical Exam: Vital Signs Blood pressure 97/64, pulse 79, temperature 98.4 F (36.9 C), resp. rate 18, height 5\' 7"  (1.702 m), weight 70.7 kg, SpO2 91 %.  Constitutional: No distress . Vital signs reviewed. HEENT: EOMI, oral membranes moist Neck: supple Cardiovascular: RRR without murmur. No JVD    Respiratory/Chest: CTA Bilaterally without wheezes or rales. Normal effort    GI/Abdomen: BS +, non-tender, non-distended Ext: no clubbing, cyanosis, or edema Psych: pleasant and cooperative, distracted.  Musc: No edema in extremities.  No tenderness in extremities. Neuro: awake, followed basic commands. Word finding deficits. AOx1 only Motor: moves all 4's spontaneously, senses pain  Assessment/Plan: 1. Functional deficits secondary to TBI which require 3+ hours per day of interdisciplinary therapy in a comprehensive inpatient rehab setting.  Physiatrist is providing close team supervision and 24 hour management of active medical problems listed below.  Physiatrist and rehab team continue to assess barriers to discharge/monitor patient progress toward functional and medical goals  Care Tool:  Bathing  Bathing activity did not occur: Refused Body parts bathed by patient: Right arm, Left arm, Chest, Abdomen, Front perineal area, Right upper leg, Left upper leg, Face,  Right lower leg, Left lower leg   Body parts bathed by helper: Buttocks     Bathing assist Assist Level: Minimal Assistance - Patient > 75%     Upper Body Dressing/Undressing Upper body dressing   What is the patient wearing?: Pull over shirt    Upper body assist Assist Level: Supervision/Verbal cueing    Lower Body Dressing/Undressing Lower body dressing      What is the patient wearing?: Pants     Lower body assist Assist for lower body dressing: Contact Guard/Touching assist     Toileting Toileting    Toileting assist Assist for toileting: Minimal Assistance - Patient > 75%     Transfers Chair/bed transfer  Transfers assist  Chair/bed transfer activity did not occur: Safety/medical concerns  Chair/bed transfer assist level: Minimal Assistance - Patient > 75%     Locomotion Ambulation   Ambulation assist   Ambulation activity did not occur: Refused(pt refused gait w/o AD)  Assist level: Moderate Assistance - Patient 50 - 74% Assistive device: Walker-rolling Max distance: 25'   Walk 10 feet activity   Assist  Walk 10 feet activity did not occur: Refused  Assist level: Moderate Assistance - Patient - 50 - 74% Assistive device: Walker-rolling   Walk 50 feet activity   Assist Walk 50 feet with 2 turns activity did not occur: (did not perform turning w/gait)  Assist level: Minimal Assistance - Patient > 75% Assistive device: Walker-rolling    Walk 150 feet activity   Assist  Walk 10 feet on uneven surface  activity   Assist Walk 10 feet on uneven surfaces activity did not occur: Actor activity did not occur: N/A         Wheelchair 50 feet with 2 turns activity    Assist    Wheelchair 50 feet with 2 turns activity did not occur: N/A       Wheelchair 150 feet activity     Assist  Wheelchair 150 feet activity did not occur: N/A       Blood  pressure 97/64, pulse 79, temperature 98.4 F (36.9 C), resp. rate 18, height 5\' 7"  (1.702 m), weight 70.7 kg, SpO2 91 %.  Medical Problem List and Plan: 1.Functional and cognitive deficitssecondary to TBI with skull fracture  Continue CIR  -RLAS V, slow progress likely linked to significant ETOH abuse, aphasic also  -5/14  SNF still pending. Have reached out to sister to discuss dispo 2. Antithrombotics: -DVT/anticoagulation:Pharmaceutical:Lovenox -antiplatelet therapy: N/a 3. Pain Management:Ultram and/or oxycodone prn.    -continue scheduled tylenol qid for now.well controlled  4. Mood:LCSW to follow for evaluation and support when appropriate. -antipsychotic agents: seroquel 5. Neuropsych: This patientis notcapable of making decisions on hisown behalf.  Regular bed now   RLAS V   -5/18 continue on seroquel at HS to help with sleep and hs agitation 6. Skin/Wound Care:routine pressure relief measures. 7. Fluids/Electrolytes/Nutrition:good appetite  -f/u labs today all reviewed 8. Agitation: At risk for falls.    - environmental mods  -continue propranolol when willing to use  -seroquel  Telesitter.  9. Hyperglycemia:  Resolved off tf  10. History of polysubstance abuse: Continue thiamine and folic acid.  11. Dysphagia: D2 thins for now  Eating well   -today's labs acceptable   14.  Hyponatremia  Sodium 133 5/17   LOS: 19 days A FACE TO FACE EVALUATION WAS PERFORMED  6/17 09/06/2019, 11:06 AM     [facility] PHYSICAL MEDICINE & REHABILITATION PROGRESS NOTE   Subjective/Complaints: Pt up in bed eating breafkast. Appears comfortable. Slept most of night  ROS: Limited due to cognitive/behavioral     Objective:   No results found. Recent Labs    09/05/19 0524  WBC 3.6*  HGB 13.2  HCT 40.2  PLT 211   Recent Labs    09/05/19 0524  NA 138  K 4.4  CL 103  CO2 29  GLUCOSE 106*  BUN 11  CREATININE 1.10   CALCIUM 9.6    Intake/Output Summary (Last 24 hours) at 09/06/2019 1106 Last data filed at 09/06/2019 09/08/2019 Gross per 24 hour  Intake 320 ml  Output --  Net 320 ml     Physical Exam: Vital Signs Blood pressure 97/64, pulse 79, temperature 98.4 F (36.9 C), resp. rate 18, height 5\' 7"  (1.702 m), weight 70.7 kg, SpO2 91 %.  Constitutional: No distress . Vital signs reviewed. HEENT: EOMI, oral membranes moist Neck: supple Cardiovascular: RRR without murmur. No JVD    Respiratory/Chest: CTA Bilaterally without wheezes or rales. Normal effort    GI/Abdomen: BS +, non-tender, non-distended Ext: no clubbing, cyanosis, or edema Psych: in good spirits. Musc: No edema in extremities.  No tenderness in extremities. Neuro: awake, followed basic commands. When asked questions of orientation he answered "over" repeatedly. Better with spontaneous speech, expressive aphasia? AOx1 only Motor: moves all 4's spontaneously, senses pain  Assessment/Plan:  1. Functional deficits secondary to TBI which require 3+ hours per day of interdisciplinary therapy in a comprehensive inpatient rehab setting.  Physiatrist is providing close team supervision and 24 hour management of active medical problems listed below.  Physiatrist and rehab team continue to assess barriers to discharge/monitor patient progress toward functional and medical goals  Care Tool:  Bathing  Bathing activity did not occur: Refused Body parts bathed by patient: Right arm, Left arm, Chest, Abdomen, Front perineal area, Right upper leg, Left upper leg, Face, Right lower leg, Left lower leg   Body parts bathed by helper: Buttocks     Bathing assist Assist Level: Minimal Assistance - Patient > 75%     Upper Body Dressing/Undressing Upper body dressing   What is the patient wearing?: Pull over shirt    Upper body assist Assist Level: Supervision/Verbal cueing    Lower Body Dressing/Undressing Lower body dressing      What  is the patient wearing?: Pants     Lower body assist Assist for lower body dressing: Contact Guard/Touching assist     Toileting Toileting    Toileting assist Assist for toileting: Minimal Assistance - Patient > 75%     Transfers Chair/bed transfer  Transfers assist  Chair/bed transfer activity did not occur: Safety/medical concerns  Chair/bed transfer assist level: Minimal Assistance - Patient > 75%     Locomotion Ambulation   Ambulation assist   Ambulation activity did not occur: Refused(pt refused gait w/o AD)  Assist level: Moderate Assistance - Patient 50 - 74% Assistive device: Walker-rolling Max distance: 25'   Walk 10 feet activity   Assist  Walk 10 feet activity did not occur: Refused  Assist level: Moderate Assistance - Patient - 50 - 74% Assistive device: Walker-rolling   Walk 50 feet activity   Assist Walk 50 feet with 2 turns activity did not occur: (did not perform turning w/gait)  Assist level: Minimal Assistance - Patient > 75% Assistive device: Walker-rolling    Walk 150 feet activity   Assist           Walk 10 feet on uneven surface  activity   Assist Walk 10 feet on uneven surfaces activity did not occur: Armed forces operational officer activity did not occur: N/A         Wheelchair 50 feet with 2 turns activity    Assist    Wheelchair 50 feet with 2 turns activity did not occur: N/A       Wheelchair 150 feet activity     Assist  Wheelchair 150 feet activity did not occur: N/A       Blood pressure 97/64, pulse 79, temperature 98.4 F (36.9 C), resp. rate 18, height 5\' 7"  (1.702 m), weight 70.7 kg, SpO2 91 %.  Medical Problem List and Plan: 1.Functional and cognitive deficitssecondary to TBI with skull fracture  Continue CIR  -RLAS V, slow progress likely linked to significant ETOH abuse, aphasic also 2.  Antithrombotics: -DVT/anticoagulation:Pharmaceutical:Lovenox -antiplatelet therapy: N/a 3. Pain Management:Ultram and/or oxycodone prn.    -continue scheduled tylenol qid for now.Well controlled.  4. Mood:LCSW to follow for evaluation and support when appropriate. -antipsychotic agents: seroquel 5. Neuropsych: This patientis notcapable of making decisions on hisown behalf.  In regular bed. Low bed preferred.   RLAS V   -dc klonopin  -wean hs klonopin also to limit day sedation while maintaining sleep pattern 6.  Skin/Wound Care:routine pressure relief measures. 7. Fluids/Electrolytes/Nutrition:encourage PO 8. Agitation: At risk for falls.    - continue on hs klonopin 0.25mg  and seroquel 25mg  AM, nightly dose increased to 37.5   quite/controlled environment   -continue propranolol-seems to have helped  -sleep chart   9. Hyperglycemia:  Resolved off tf  10. History of polysubstance abuse: Continue thiamine and folic acid.  11. Dysphagia: D2 thins for now  Eating well   -recent labs acceptable   14.  Hyponatremia  Sodium 133 on 4/30, up to 136 5/3, up to 138 on 5/10 15. Disposition: SW sending SNF referrals  LOS: 19 days A FACE TO FACE EVALUATION WAS PERFORMED  7/10 09/06/2019, 11:06 AM

## 2019-09-06 NOTE — Progress Notes (Signed)
Patient ID: Jeremiah West, male   DOB: October 12, 1950, 69 y.o.   MRN: [id number]  Sw attempted to follow up with sister about SNF placement, no answer. Left voicemail

## 2019-09-07 ENCOUNTER — Inpatient Hospital Stay (HOSPITAL_COMMUNITY): Payer: Medicare Other

## 2019-09-07 ENCOUNTER — Inpatient Hospital Stay (HOSPITAL_COMMUNITY): Payer: Medicare Other | Admitting: Physical Therapy

## 2019-09-07 NOTE — Progress Notes (Addendum)
Physical Therapy Session Note  Patient Details  Name: Jeremiah West MRN: [id number] Date of Birth: 29-Dec-1950  Today's Date: 09/07/2019 PT Individual Time: 1015-1025 PT Individual Time Calculation (min): 10 min   Short Term Goals: Week 3:  PT Short Term Goal 1 (Week 3): Pt will initiate functional tasks w/ min cues 100% of the time PT Short Term Goal 2 (Week 3): Pt will ambulate 28' w/ min assist w/ LRAD consistently PT Short Term Goal 3 (Week 3): Pt will maintain selective attention to functional tasks in controlled environment w/ min assist  Skilled Therapeutic Interventions/Progress Updates:   Pt in supine and initially refusing any activity. Brief and sheets noted to be soaked w/ urine. With encouragement, pt agreeable to get up, OOB to change clothes and apply new sheets. Supervision bed mobility and sit<>stand to RW w/ CGA and increased time w/ verbal cues for sequencing. Total assist for pericare and brief management. CGA stand pivot to recliner. Pt cooperative once up in recliner, pleasant and expressed appreciation for NT and PT donning new sheets. Despite this, he refused any further activity, only wanting to return to supine. Stand pivot to EOB w/ CGA. Ended session in supine, all needs in reach. Missed 20 min of skilled PT 2/2 refusal/fatigue.  Therapy Documentation Precautions:  Precautions Precautions: Fall Precaution Comments: fall risk. no longer in need of restraints Restrictions Weight Bearing Restrictions: No  Therapy/Group: Individual Therapy  Monseratt Ledin Melton Krebs 09/07/2019, 10:58 AM

## 2019-09-07 NOTE — Plan of Care (Signed)
  Problem: Consults Goal: RH BRAIN INJURY PATIENT EDUCATION Description: Description: See Patient Education module for eduction specifics Outcome: Progressing Goal: Nutrition Consult-if indicated Outcome: Progressing Goal: Diabetes Guidelines if Diabetic/Glucose > 140 Description: If diabetic or lab glucose is > 140 mg/dl - Initiate Diabetes/Hyperglycemia Guidelines & Document Interventions  Outcome: Progressing   Problem: RH BOWEL ELIMINATION Goal: RH STG MANAGE BOWEL WITH ASSISTANCE Description: STG Manage Bowel with min Assistance. Outcome: Progressing   Problem: RH BLADDER ELIMINATION Goal: RH STG MANAGE BLADDER WITH ASSISTANCE Description: STG Manage Bladder With min Assistance Outcome: Progressing   Problem: RH SAFETY Goal: RH STG ADHERE TO SAFETY PRECAUTIONS W/ASSISTANCE/DEVICE Description: STG Adhere to Safety Precautions With min/mod Assistance/Device. Outcome: Progressing   Problem: RH COGNITION-NURSING Goal: RH STG USES MEMORY AIDS/STRATEGIES W/ASSIST TO PROBLEM SOLVE Description: STG Uses Memory Aids/Strategies With min/mod Assistance to Problem Solve. Outcome: Progressing Goal: RH STG ANTICIPATES NEEDS/CALLS FOR ASSIST W/ASSIST/CUES Description: STG Anticipates Needs/Calls for Assist With min/mod Assistance/Cues. Outcome: Progressing   Problem: RH KNOWLEDGE DEFICIT BRAIN INJURY Goal: RH STG INCREASE KNOWLEDGE OF SELF CARE AFTER BRAIN INJURY Description: With min/mod assist Outcome: Not Progressing Goal: RH STG INCREASE KNOWLEDGE OF DYSPHAGIA/FLUID INTAKE Description: With min/mod assist Outcome: Not Progressing

## 2019-09-07 NOTE — Progress Notes (Signed)
[place] PHYSICAL MEDICINE & REHABILITATION PROGRESS NOTE   Subjective/Complaints: Pt lying in bed. Has no complaints besides feeling tired.  Initially refused PT today, was eventually agreeable, but missed 20 minutes due to fatigue Incontinent of urine.   ROS: Limited due to cognitive/behavioral   Objective:   No results found. Recent Labs    09/05/19 0524  WBC 3.6*  HGB 13.2  HCT 40.2  PLT 211   Recent Labs    09/05/19 0524  NA 138  K 4.4  CL 103  CO2 29  GLUCOSE 106*  BUN 11  CREATININE 1.10  CALCIUM 9.6    Intake/Output Summary (Last 24 hours) at 09/07/2019 1304 Last data filed at 09/07/2019 1259 Gross per 24 hour  Intake 360 ml  Output --  Net 360 ml     Physical Exam: Vital Signs Blood pressure 111/74, pulse 66, temperature 98 F (36.7 C), resp. rate 18, height 5\' 7"  (1.702 m), weight 69.4 kg, SpO2 97 %.  Constitutional: No distress . Vital signs reviewed. Lying in bed with covers pulled over him.  HEENT: EOMI, oral membranes moist Neck: supple Cardiovascular: RRR without murmur. No JVD    Respiratory/Chest: CTA Bilaterally without wheezes or rales. Normal effort    GI/Abdomen: BS +, non-tender, non-distended Ext: no clubbing, cyanosis, or edema Psych: pleasant and cooperative, distracted.  Musc: No edema in extremities.  No tenderness in extremities. Neuro: awake, followed basic commands. Word finding deficits. AOx1 only Motor: moves all 4's spontaneously, senses pain  Assessment/Plan: 1. Functional deficits secondary to TBI which require 3+ hours per day of interdisciplinary therapy in a comprehensive inpatient rehab setting.  Physiatrist is providing close team supervision and 24 hour management of active medical problems listed below.  Physiatrist and rehab team continue to assess barriers to discharge/monitor patient progress toward functional and medical goals  Care Tool:  Bathing  Bathing activity did not occur: Refused Body parts  bathed by patient: Right arm, Left arm, Chest, Abdomen, Front perineal area, Right upper leg, Left upper leg, Face, Right lower leg, Left lower leg   Body parts bathed by helper: Buttocks     Bathing assist Assist Level: Minimal Assistance - Patient > 75%     Upper Body Dressing/Undressing Upper body dressing   What is the patient wearing?: Pull over shirt    Upper body assist Assist Level: Supervision/Verbal cueing    Lower Body Dressing/Undressing Lower body dressing      What is the patient wearing?: Pants, Incontinence brief     Lower body assist Assist for lower body dressing: Minimal Assistance - Patient > 75%     Toileting Toileting    Toileting assist Assist for toileting: Minimal Assistance - Patient > 75%     Transfers Chair/bed transfer  Transfers assist  Chair/bed transfer activity did not occur: Safety/medical concerns  Chair/bed transfer assist level: Contact Guard/Touching assist     Locomotion Ambulation   Ambulation assist   Ambulation activity did not occur: Refused(pt refused gait w/o AD)  Assist level: Minimal Assistance - Patient > 75% Assistive device: Walker-rolling Max distance: 75'   Walk 10 feet activity   Assist  Walk 10 feet activity did not occur: Refused  Assist level: Minimal Assistance - Patient > 75% Assistive device: Walker-rolling   Walk 50 feet activity   Assist Walk 50 feet with 2 turns activity did not occur: (did not perform turning w/gait)  Assist level: Minimal Assistance - Patient > 75% Assistive device: Walker-rolling  Walk 150 feet activity   Assist           Walk 10 feet on uneven surface  activity   Assist Walk 10 feet on uneven surfaces activity did not occur: Actor activity did not occur: N/A         Wheelchair 50 feet with 2 turns activity    Assist    Wheelchair 50 feet with 2 turns activity did not occur:  N/A       Wheelchair 150 feet activity     Assist  Wheelchair 150 feet activity did not occur: N/A       Blood pressure 111/74, pulse 66, temperature 98 F (36.7 C), resp. rate 18, height 5\' 7"  (1.702 m), weight 69.4 kg, SpO2 97 %.  Medical Problem List and Plan: 1.Functional and cognitive deficitssecondary to TBI with skull fracture  Continue CIR  -RLAS V, slow progress likely linked to significant ETOH abuse, aphasic also  -5/14  SNF still pending. Have reached out to sister to discuss dispo 2. Antithrombotics: -DVT/anticoagulation:Pharmaceutical:Lovenox -antiplatelet therapy: N/a 3. Pain Management:Ultram and/or oxycodone prn.    -continue scheduled tylenol qid for now.well controlled 4. Mood:LCSW to follow for evaluation and support when appropriate. -antipsychotic agents: seroquel 5. Neuropsych: This patientis notcapable of making decisions on hisown behalf.  Regular bed now   RLAS V   -5/18 continue on seroquel at HS to help with sleep and hs agitation 6. Skin/Wound Care:routine pressure relief measures. 7. Fluids/Electrolytes/Nutrition:good appetite  -f/u labs all reviewed 8. Agitation: At risk for falls.    - environmental mods  -continue propranolol when willing to use  -seroquel  Telesitter  9. Hyperglycemia:  Resolved off tf  10. History of polysubstance abuse: Continue thiamine and folic acid.  11. Dysphagia: D2 thins for now  Eating well   -today's labs acceptable   14.  Hyponatremia  Sodium 133 5/17   LOS: 20 days A FACE TO FACE EVALUATION WAS PERFORMED  Shalimar Mcclain P Ryelee Albee 09/07/2019, 1:04 PM     [place] PHYSICAL MEDICINE & REHABILITATION PROGRESS NOTE   Subjective/Complaints: Pt up in bed eating breafkast. Appears comfortable. Slept most of night  ROS: Limited due to cognitive/behavioral     Objective:   No results found. Recent Labs    09/05/19 0524  WBC 3.6*  HGB 13.2  HCT 40.2  PLT  211   Recent Labs    09/05/19 0524  NA 138  K 4.4  CL 103  CO2 29  GLUCOSE 106*  BUN 11  CREATININE 1.10  CALCIUM 9.6    Intake/Output Summary (Last 24 hours) at 09/07/2019 1304 Last data filed at 09/07/2019 1259 Gross per 24 hour  Intake 360 ml  Output --  Net 360 ml     Physical Exam: Vital Signs Blood pressure 111/74, pulse 66, temperature 98 F (36.7 C), resp. rate 18, height 5\' 7"  (1.702 m), weight 69.4 kg, SpO2 97 %.  Constitutional: No distress . Vital signs reviewed. HEENT: EOMI, oral membranes moist Neck: supple Cardiovascular: RRR without murmur. No JVD    Respiratory/Chest: CTA Bilaterally without wheezes or rales. Normal effort    GI/Abdomen: BS +, non-tender, non-distended Ext: no clubbing, cyanosis, or edema Psych: in good spirits. Musc: No edema in extremities.  No tenderness in extremities. Neuro: awake, followed basic commands. When asked questions of orientation he answered "over" repeatedly. Better with  spontaneous speech, expressive aphasia? AOx1 only Motor: moves all 4's spontaneously, senses pain  Assessment/Plan: 1. Functional deficits secondary to TBI which require 3+ hours per day of interdisciplinary therapy in a comprehensive inpatient rehab setting.  Physiatrist is providing close team supervision and 24 hour management of active medical problems listed below.  Physiatrist and rehab team continue to assess barriers to discharge/monitor patient progress toward functional and medical goals  Care Tool:  Bathing  Bathing activity did not occur: Refused Body parts bathed by patient: Right arm, Left arm, Chest, Abdomen, Front perineal area, Right upper leg, Left upper leg, Face, Right lower leg, Left lower leg   Body parts bathed by helper: Buttocks     Bathing assist Assist Level: Minimal Assistance - Patient > 75%     Upper Body Dressing/Undressing Upper body dressing   What is the patient wearing?: Pull over shirt    Upper body  assist Assist Level: Supervision/Verbal cueing    Lower Body Dressing/Undressing Lower body dressing      What is the patient wearing?: Pants, Incontinence brief     Lower body assist Assist for lower body dressing: Minimal Assistance - Patient > 75%     Toileting Toileting    Toileting assist Assist for toileting: Minimal Assistance - Patient > 75%     Transfers Chair/bed transfer  Transfers assist  Chair/bed transfer activity did not occur: Safety/medical concerns  Chair/bed transfer assist level: Contact Guard/Touching assist     Locomotion Ambulation   Ambulation assist   Ambulation activity did not occur: Refused(pt refused gait w/o AD)  Assist level: Minimal Assistance - Patient > 75% Assistive device: Walker-rolling Max distance: 75'   Walk 10 feet activity   Assist  Walk 10 feet activity did not occur: Refused  Assist level: Minimal Assistance - Patient > 75% Assistive device: Walker-rolling   Walk 50 feet activity   Assist Walk 50 feet with 2 turns activity did not occur: (did not perform turning w/gait)  Assist level: Minimal Assistance - Patient > 75% Assistive device: Walker-rolling    Walk 150 feet activity   Assist           Walk 10 feet on uneven surface  activity   Assist Walk 10 feet on uneven surfaces activity did not occur: Actor activity did not occur: N/A         Wheelchair 50 feet with 2 turns activity    Assist    Wheelchair 50 feet with 2 turns activity did not occur: N/A       Wheelchair 150 feet activity     Assist  Wheelchair 150 feet activity did not occur: N/A       Blood pressure 111/74, pulse 66, temperature 98 F (36.7 C), resp. rate 18, height 5\' 7"  (1.702 m), weight 69.4 kg, SpO2 97 %.  Medical Problem List and Plan: 1.Functional and cognitive deficitssecondary to TBI with skull fracture  Continue CIR  -RLAS V, slow  progress likely linked to significant ETOH abuse, aphasic also 2. Antithrombotics: -DVT/anticoagulation:Pharmaceutical:Lovenox -antiplatelet therapy: N/a 3. Pain Management:Ultram and/or oxycodone prn.    -continue scheduled tylenol qid for now.Well controlled.  4. Mood:LCSW to follow for evaluation and support when appropriate. -antipsychotic agents: seroquel 5. Neuropsych: This patientis notcapable of making decisions on hisown behalf.  In regular bed. Low bed preferred.   RLAS V   -dc klonopin  -  wean hs klonopin also to limit day sedation while maintaining sleep pattern 6. Skin/Wound Care:routine pressure relief measures. 7. Fluids/Electrolytes/Nutrition:encourage PO 8. Agitation: At risk for falls.    - continue on hs klonopin 0.25mg  and seroquel 25mg  AM, nightly dose increased to 37.5   quite/controlled environment   -continue propranolol-seems to have helped  -sleep chart   9. Hyperglycemia:  Resolved off tf  10. History of polysubstance abuse: Continue thiamine and folic acid.  11. Dysphagia: D2 thins for now  Eating well   -recent labs acceptable   14.  Hyponatremia  Sodium 133 on 4/30, up to 136 5/3, up to 138 on 5/10 15. Disposition: SW sending SNF referrals  LOS: 20 days A FACE TO FACE EVALUATION WAS PERFORMED  Timothea Bodenheimer P Kariah Loredo 09/07/2019, 1:04 PM

## 2019-09-07 NOTE — Progress Notes (Signed)
Occupational Therapy Note  Patient Details  Name: Jeremiah West MRN: [id number] Date of Birth: 02-23-1951  Today's Date: 09/07/2019 OT Missed Time: 30 Minutes Missed Time Reason: Patient unwilling/refused to participate without medical reason  Pt received at RN station pleasant, however when given choices pt continues to decline all options. Pt states, "im doing nothing but sitting right here." Pt remains in w/c at RN station missing 30 min OT  Shon Hale 09/07/2019, 2:55 PM

## 2019-09-08 ENCOUNTER — Inpatient Hospital Stay (HOSPITAL_COMMUNITY): Payer: Medicare Other

## 2019-09-08 ENCOUNTER — Inpatient Hospital Stay (HOSPITAL_COMMUNITY): Payer: Medicare Other | Admitting: Physical Therapy

## 2019-09-08 MED ORDER — QUETIAPINE FUMARATE 25 MG PO TABS
25.0000 mg | ORAL_TABLET | Freq: Every day | ORAL | Status: DC
  Filled 2019-09-08: qty 1

## 2019-09-08 NOTE — Progress Notes (Addendum)
Physical Therapy Session Note  Patient Details  Name: Jeremiah West MRN: [id number] Date of Birth: 1950-11-16  Today's Date: 09/08/2019 PT Individual Time: 1140-1155 PT Individual Time Calculation (min): 15 min   Short Term Goals: Week 3:  PT Short Term Goal 1 (Week 3): Pt will initiate functional tasks w/ min cues 100% of the time PT Short Term Goal 2 (Week 3): Pt will ambulate 33' w/ min assist w/ LRAD consistently PT Short Term Goal 3 (Week 3): Pt will maintain selective attention to functional tasks in controlled environment w/ min assist  Skilled Therapeutic Interventions/Progress Updates:   Made 1st attempt to visit w/ pt at scheduled therapy time, pt refusing despite max encouragement. Returned later in morning and pt up in w/c, agreeable to participate in therapy. Pt perseverating on calling both his girlfriend and landlord, but easily redirected. Session focused on attention to task in distracting environment and activity tolerance. Played lifesize connect 4 game, pt able to verbalize goal of game but unable to meaningfully play for purpose of winning game and needed verbal and visual cues to identify when he had achieved 4 in a row. Ambulated w/o AD, LUE over therapist's shoulder x50' w/ verbal and tactile cues for upright posture and to minimize antalgic gait pattern. Returned to sitting in w/c and performed playing card matching game, able to correctly identify a match of cards, but unable to physically bring cards together despite cues to use UEs to do so. Pt attended to task well in moderately-distracting environment, needed only min cues. Returned to nurses station in w/c, ended session in w/c and all needs in reach.   Missed 15 min of skilled PT 2/2 refusal.   Therapy Documentation Precautions:  Precautions Precautions: Fall Precaution Comments: fall risk. no longer in need of restraints Restrictions Weight Bearing Restrictions: No   Therapy/Group: Individual  Therapy  Pat Sires Melton Krebs 09/08/2019, 12:14 PM

## 2019-09-08 NOTE — Progress Notes (Addendum)
Occupational Therapy Session Note  Patient Details  Name: Jeremiah West MRN: [id number] Date of Birth: 1950-10-04  Today's Date: 09/08/2019 OT Individual Time: 1300-1317 OT Individual Time Calculation (min): 17 min  and Today's Date: 09/08/2019 OT Missed Time: 13 Minutes Missed Time Reason: Patient unwilling/refused to participate without medical reason   Short Term Goals: Week 3:  OT Short Term Goal 1 (Week 3): Pt will participate in 80% of tx sessions OT Short Term Goal 2 (Week 3): pt will terminate bathing with min VC  Skilled Therapeutic Interventions/Progress Updates:    1:1. Pt received in bed with NT present reporting he needs to change pants d/t urine stain. Pt stating, "they arent wet." but when OT shows pt wet spot pt agrees to change. Pt threads BLE into pants but requires A for orientation to self and continues to don backwards despite cues. Pt refuses to turn pants around. Pt wanting to get back in bed, however bed linens soaked with urine. Pt given all linens and instructed to hand to OT in order per OT request. Pt able to select correctly 3/5 pieces of linen and apply pillow case with visual cue to find end of pillow case. Exited session with pt seated in bed, exit alarm on and telesitter active  Therapy Documentation Precautions:  Precautions Precautions: Fall Belt alarm on at end of session or exit alarm on bed Restrictions Weight Bearing Restrictions: No General: General OT Amount of Missed Time: 13 Minutes PT Missed Treatment Reason: Patient unwilling to participate Vital Signs:   Pain: Pain Assessment Pain Scale: 0-10 Pain Score: 0-No pain ADL: ADL Grooming: Moderate assistance Where Assessed-Grooming: Standing at sink Upper Body Bathing: Maximal assistance Where Assessed-Upper Body Bathing: Sitting at sink Lower Body Bathing: Maximal assistance Where Assessed-Lower Body Bathing: Sitting at sink Upper Body Dressing: Moderate assistance Where  Assessed-Upper Body Dressing: Sitting at sink Lower Body Dressing: Maximal assistance Where Assessed-Lower Body Dressing: Sitting at sink, Standing at sink Toilet Transfer: Minimal assistance Toilet Transfer Method: Stand pivot Toilet Transfer Equipment: Grab bars Vision   Perception    Praxis   Exercises:   Other Treatments:     Therapy/Group: Individual Therapy  Shon Hale 09/08/2019, 1:18 PM

## 2019-09-08 NOTE — Progress Notes (Addendum)
Patient presented very impulsive and continued to try to exit bed. Patient eventually calmed down and proceeded to lay down in bed. Nurse exited the room and was at nurses station when telesitter alarm sounded and nurse heard door close. Upon entering the room with NT patient was observed OOB and standing at door leaning on trash can for balance. Nurse walked patient back to bed and stayed with patient and talk and distracted patient with some television. After about 30-40 minutes patient proceeded to lay himself down in the bed. Nurse made sure that patient was comfortable and proceeded to leave the room. At around quarter til 6 or so Nurse had emerged from another patient's room and heard yelling from said patient. Then nurse witnessed patient be rolled out of room by NT and being placed at nurses station. At station patient proceeded to roll himself to the room. And when asked did he want to eat he stated that he wanted to eat and leave. At this time the charge nurse locked the doors to the unit.   **Late Entry**

## 2019-09-08 NOTE — Plan of Care (Signed)
  Problem: Consults Goal: RH BRAIN INJURY PATIENT EDUCATION Description: Description: See Patient Education module for eduction specifics Outcome: Progressing Goal: Nutrition Consult-if indicated Outcome: Progressing Goal: Diabetes Guidelines if Diabetic/Glucose > 140 Description: If diabetic or lab glucose is > 140 mg/dl - Initiate Diabetes/Hyperglycemia Guidelines & Document Interventions  Outcome: Progressing   Problem: RH BOWEL ELIMINATION Goal: RH STG MANAGE BOWEL WITH ASSISTANCE Description: STG Manage Bowel with min Assistance. Outcome: Progressing   Problem: RH BLADDER ELIMINATION Goal: RH STG MANAGE BLADDER WITH ASSISTANCE Description: STG Manage Bladder With min Assistance Outcome: Progressing   Problem: RH KNOWLEDGE DEFICIT BRAIN INJURY Goal: RH STG INCREASE KNOWLEDGE OF SELF CARE AFTER BRAIN INJURY Description: With min/mod assist Outcome: Progressing Goal: RH STG INCREASE KNOWLEDGE OF DYSPHAGIA/FLUID INTAKE Description: With min/mod assist Outcome: Progressing   Problem: RH SAFETY Goal: RH STG ADHERE TO SAFETY PRECAUTIONS W/ASSISTANCE/DEVICE Description: STG Adhere to Safety Precautions With min/mod Assistance/Device. Outcome: Not Progressing Patient does not understand nor does he adhere to safety measures. Patient is very impulsive   Problem: RH COGNITION-NURSING Goal: RH STG USES MEMORY AIDS/STRATEGIES W/ASSIST TO PROBLEM SOLVE Description: STG Uses Memory Aids/Strategies With min/mod Assistance to Problem Solve. Outcome: Not Progressing Patient continues to receive cueing but is unable to properly use memeory aids. Goal: RH STG ANTICIPATES NEEDS/CALLS FOR ASSIST W/ASSIST/CUES Description: STG Anticipates Needs/Calls for Assist With min/mod Assistance/Cues. Outcome: Not Progressing Patient needs are anticipated but patient is unaware of current needs. '

## 2019-09-08 NOTE — Progress Notes (Signed)
Patient ID: Jeremiah West, male   DOB: 1951/01/16, 69 y.o.   MRN: [id number]  SW called sister to follow up on SNF options/decison or home placement. No answer, left voicemail. Will continue to follow up.

## 2019-09-08 NOTE — Progress Notes (Signed)
[place] PHYSICAL MEDICINE & REHABILITATION PROGRESS NOTE   Subjective/Complaints: No new issues. Up in bed. Just finished breakfast  ROS: Limited due to cognitive/behavioral    Objective:   No results found. No results for input(s): WBC, HGB, HCT, PLT in the last 72 hours. No results for input(s): NA, K, CL, CO2, GLUCOSE, BUN, CREATININE, CALCIUM in the last 72 hours.  Intake/Output Summary (Last 24 hours) at 09/08/2019 1026 Last data filed at 09/07/2019 1829 Gross per 24 hour  Intake 420 ml  Output --  Net 420 ml     Physical Exam: Vital Signs Blood pressure 108/60, pulse 76, temperature 98 F (36.7 C), resp. rate 16, height 5\' 7"  (1.702 m), weight 70.4 kg, SpO2 98 %.  Constitutional: No distress . Vital signs reviewed. HEENT: EOMI, oral membranes moist Neck: supple Cardiovascular: RRR without murmur. No JVD    Respiratory/Chest: CTA Bilaterally without wheezes or rales. Normal effort    GI/Abdomen: BS +, non-tender, non-distended Ext: no clubbing, cyanosis, or edema Psych: pleasant and distracted  Musc: No edema in extremities.  No tenderness in extremities. Neuro: alert, ongoing Word finding deficits. AOx1 only Motor: moves all 4's spontaneously, senses pain  Assessment/Plan: 1. Functional deficits secondary to TBI which require 3+ hours per day of interdisciplinary therapy in a comprehensive inpatient rehab setting.  Physiatrist is providing close team supervision and 24 hour management of active medical problems listed below.  Physiatrist and rehab team continue to assess barriers to discharge/monitor patient progress toward functional and medical goals  Care Tool:  Bathing  Bathing activity did not occur: Refused Body parts bathed by patient: Right arm, Left arm, Chest, Abdomen, Front perineal area, Right upper leg, Left upper leg, Face, Right lower leg, Left lower leg   Body parts bathed by helper: Buttocks     Bathing assist Assist Level: Minimal  Assistance - Patient > 75%     Upper Body Dressing/Undressing Upper body dressing   What is the patient wearing?: Pull over shirt    Upper body assist Assist Level: Supervision/Verbal cueing    Lower Body Dressing/Undressing Lower body dressing      What is the patient wearing?: Pants, Incontinence brief     Lower body assist Assist for lower body dressing: Minimal Assistance - Patient > 75%     Toileting Toileting    Toileting assist Assist for toileting: Minimal Assistance - Patient > 75%     Transfers Chair/bed transfer  Transfers assist  Chair/bed transfer activity did not occur: Safety/medical concerns  Chair/bed transfer assist level: Contact Guard/Touching assist     Locomotion Ambulation   Ambulation assist   Ambulation activity did not occur: Refused(pt refused gait w/o AD)  Assist level: Minimal Assistance - Patient > 75% Assistive device: Walker-rolling Max distance: 75'   Walk 10 feet activity   Assist  Walk 10 feet activity did not occur: Refused  Assist level: Minimal Assistance - Patient > 75% Assistive device: Walker-rolling   Walk 50 feet activity   Assist Walk 50 feet with 2 turns activity did not occur: (did not perform turning w/gait)  Assist level: Minimal Assistance - Patient > 75% Assistive device: Walker-rolling    Walk 150 feet activity   Assist           Walk 10 feet on uneven surface  activity   Assist Walk 10 feet on uneven surfaces activity did not occur: Sales promotion account executive  Wheelchair activity did not occur: N/A         Wheelchair 50 feet with 2 turns activity    Assist    Wheelchair 50 feet with 2 turns activity did not occur: N/A       Wheelchair 150 feet activity     Assist  Wheelchair 150 feet activity did not occur: N/A       Blood pressure 108/60, pulse 76, temperature 98 F (36.7 C), resp. rate 16, height 5\' 7"  (1.702 m), weight 70.4 kg,  SpO2 98 %.  Medical Problem List and Plan: 1.Functional and cognitive deficitssecondary to TBI with skull fracture  Continue CIR  -RLAS V, slow progress likely linked to significant ETOH abuse, aphasic also  -5/20  SNF still pending. Reaching out to sister again re: dispo 2. Antithrombotics: -DVT/anticoagulation:Pharmaceutical:Lovenox -antiplatelet therapy: N/a 3. Pain Management:Ultram and/or oxycodone prn.    -continue scheduled tylenol qid for now.well controlled 4. Mood:LCSW to follow for evaluation and support when appropriate. -antipsychotic agents: seroquel 5. Neuropsych: This patientis notcapable of making decisions on hisown behalf.  Regular bed now   RLAS V   -5/20 continue on seroquel at HS to help with sleep and hs agitation 6. Skin/Wound Care:routine pressure relief measures. 7. Fluids/Electrolytes/Nutrition:good appetite  -f/u labs all reviewed 8. Agitation: At risk for falls.    - environmental mods  -continue propranolol when willing to use  -seroquel  Telesitter  9. Hyperglycemia:  Resolved off tf  10. History of polysubstance abuse: Continue thiamine and folic acid.  11. Dysphagia: D2 thins for now  Eating well   -recent labs acceptable   14.  Hyponatremia  Sodium 133 5/17   LOS: 21 days A FACE TO FACE EVALUATION WAS PERFORMED  6/17 09/08/2019, 10:26 AM     [place] PHYSICAL MEDICINE & REHABILITATION PROGRESS NOTE   Subjective/Complaints: Pt up in bed eating breafkast. Appears comfortable. Slept most of night  ROS: Limited due to cognitive/behavioral     Objective:   No results found. No results for input(s): WBC, HGB, HCT, PLT in the last 72 hours. No results for input(s): NA, K, CL, CO2, GLUCOSE, BUN, CREATININE, CALCIUM in the last 72 hours.  Intake/Output Summary (Last 24 hours) at 09/08/2019 1026 Last data filed at 09/07/2019 1829 Gross per 24 hour  Intake 420 ml  Output --  Net  420 ml     Physical Exam: Vital Signs Blood pressure 108/60, pulse 76, temperature 98 F (36.7 C), resp. rate 16, height 5\' 7"  (1.702 m), weight 70.4 kg, SpO2 98 %.  Constitutional: No distress . Vital signs reviewed. HEENT: EOMI, oral membranes moist Neck: supple Cardiovascular: RRR without murmur. No JVD    Respiratory/Chest: CTA Bilaterally without wheezes or rales. Normal effort    GI/Abdomen: BS +, non-tender, non-distended Ext: no clubbing, cyanosis, or edema Psych: in good spirits. Musc: No edema in extremities.  No tenderness in extremities. Neuro: awake, followed basic commands. When asked questions of orientation he answered "over" repeatedly. Better with spontaneous speech, expressive aphasia? AOx1 only Motor: moves all 4's spontaneously, senses pain  Assessment/Plan: 1. Functional deficits secondary to TBI which require 3+ hours per day of interdisciplinary therapy in a comprehensive inpatient rehab setting.  Physiatrist is providing close team supervision and 24 hour management of active medical problems listed below.  Physiatrist and rehab team continue to assess barriers to discharge/monitor patient progress toward functional and medical goals  Care Tool:  Bathing  Bathing activity did not occur:  Refused Body parts bathed by patient: Right arm, Left arm, Chest, Abdomen, Front perineal area, Right upper leg, Left upper leg, Face, Right lower leg, Left lower leg   Body parts bathed by helper: Buttocks     Bathing assist Assist Level: Minimal Assistance - Patient > 75%     Upper Body Dressing/Undressing Upper body dressing   What is the patient wearing?: Pull over shirt    Upper body assist Assist Level: Supervision/Verbal cueing    Lower Body Dressing/Undressing Lower body dressing      What is the patient wearing?: Pants, Incontinence brief     Lower body assist Assist for lower body dressing: Minimal Assistance - Patient > 75%      Toileting Toileting    Toileting assist Assist for toileting: Minimal Assistance - Patient > 75%     Transfers Chair/bed transfer  Transfers assist  Chair/bed transfer activity did not occur: Safety/medical concerns  Chair/bed transfer assist level: Contact Guard/Touching assist     Locomotion Ambulation   Ambulation assist   Ambulation activity did not occur: Refused(pt refused gait w/o AD)  Assist level: Minimal Assistance - Patient > 75% Assistive device: Walker-rolling Max distance: 75'   Walk 10 feet activity   Assist  Walk 10 feet activity did not occur: Refused  Assist level: Minimal Assistance - Patient > 75% Assistive device: Walker-rolling   Walk 50 feet activity   Assist Walk 50 feet with 2 turns activity did not occur: (did not perform turning w/gait)  Assist level: Minimal Assistance - Patient > 75% Assistive device: Walker-rolling    Walk 150 feet activity   Assist           Walk 10 feet on uneven surface  activity   Assist Walk 10 feet on uneven surfaces activity did not occur: Actor activity did not occur: N/A         Wheelchair 50 feet with 2 turns activity    Assist    Wheelchair 50 feet with 2 turns activity did not occur: N/A       Wheelchair 150 feet activity     Assist  Wheelchair 150 feet activity did not occur: N/A       Blood pressure 108/60, pulse 76, temperature 98 F (36.7 C), resp. rate 16, height 5\' 7"  (1.702 m), weight 70.4 kg, SpO2 98 %.  Medical Problem List and Plan: 1.Functional and cognitive deficitssecondary to TBI with skull fracture  Continue CIR  -RLAS V, slow progress likely linked to significant ETOH abuse, aphasic also 2. Antithrombotics: -DVT/anticoagulation:Pharmaceutical:Lovenox -antiplatelet therapy: N/a 3. Pain Management:Ultram and/or oxycodone prn.    -continue scheduled tylenol qid for  now.Well controlled.  4. Mood:LCSW to follow for evaluation and support when appropriate. -antipsychotic agents: seroquel 5. Neuropsych: This patientis notcapable of making decisions on hisown behalf.  In regular bed. Low bed preferred.   RLAS V   -dc klonopin  -wean hs klonopin also to limit day sedation while maintaining sleep pattern 6. Skin/Wound Care:routine pressure relief measures. 7. Fluids/Electrolytes/Nutrition:encourage PO 8. Agitation: At risk for falls.    - continue on hs klonopin 0.25mg  and seroquel 25mg  AM, nightly dose increased to 37.5   quite/controlled environment   -continue propranolol-seems to have helped  -sleep chart   9. Hyperglycemia:  Resolved off tf  10. History of polysubstance abuse: Continue thiamine and folic acid.  11. Dysphagia:  D2 thins for now  Eating well   -recent labs acceptable   14.  Hyponatremia  Sodium 133 on 4/30, up to 136 5/3, up to 138 on 5/10 15. Disposition: SW sending SNF referrals  LOS: 21 days A FACE TO FACE EVALUATION WAS PERFORMED  Ranelle Oyster 09/08/2019, 10:26 AM

## 2019-09-09 ENCOUNTER — Inpatient Hospital Stay (HOSPITAL_COMMUNITY): Payer: Medicare Other

## 2019-09-09 ENCOUNTER — Inpatient Hospital Stay (HOSPITAL_COMMUNITY): Payer: Medicare Other | Admitting: Speech Pathology

## 2019-09-09 ENCOUNTER — Inpatient Hospital Stay (HOSPITAL_COMMUNITY): Payer: Medicare Other | Admitting: Physical Therapy

## 2019-09-09 MED ORDER — PROPRANOLOL HCL 20 MG PO TABS: 20.0000 mg | ORAL_TABLET | Freq: Three times a day (TID) | ORAL | Status: DC

## 2019-09-09 MED ORDER — QUETIAPINE FUMARATE 25 MG PO TABS
25.0000 mg | ORAL_TABLET | Freq: Every day | ORAL | Status: DC
  Administered 2019-09-09 – 2019-09-23 (×11): 25 mg via ORAL
  Filled 2019-09-09 (×11): qty 1

## 2019-09-09 MED ORDER — TRAMADOL HCL 50 MG PO TABS
50.0000 mg | ORAL_TABLET | Freq: Four times a day (QID) | ORAL | Status: DC | PRN
  Administered 2019-09-10 – 2019-09-12 (×3): 50 mg via ORAL
  Filled 2019-09-09 (×4): qty 1

## 2019-09-09 MED ORDER — POLYETHYLENE GLYCOL 3350 17 G PO PACK: 17.0000 g | PACK | Freq: Every day | ORAL | 0 refills | Status: DC

## 2019-09-09 MED ORDER — ACETAMINOPHEN 325 MG PO TABS: 650.0000 mg | ORAL_TABLET | Freq: Three times a day (TID) | ORAL | Status: DC

## 2019-09-09 MED ORDER — DOCUSATE SODIUM 50 MG/5ML PO LIQD: 100.0000 mg | Freq: Two times a day (BID) | ORAL | 0 refills | Status: AC

## 2019-09-09 MED ORDER — ADULT MULTIVITAMIN W/MINERALS CH: 1.0000 | ORAL_TABLET | Freq: Every day | ORAL | Status: AC

## 2019-09-09 MED ORDER — METHOCARBAMOL 500 MG PO TABS: 500.0000 mg | ORAL_TABLET | Freq: Four times a day (QID) | ORAL | Status: AC | PRN

## 2019-09-09 MED ORDER — ENSURE ENLIVE PO LIQD: 237.0000 mL | Freq: Three times a day (TID) | ORAL | 12 refills | Status: AC

## 2019-09-09 MED ORDER — FOLIC ACID 1 MG PO TABS: 1.0000 mg | ORAL_TABLET | Freq: Every day | ORAL | Status: AC

## 2019-09-09 MED ORDER — THIAMINE HCL 100 MG PO TABS: 100.0000 mg | ORAL_TABLET | Freq: Every day | ORAL | Status: AC

## 2019-09-09 MED ORDER — QUETIAPINE FUMARATE 25 MG PO TABS: 25.0000 mg | ORAL_TABLET | Freq: Every day | ORAL | Status: DC

## 2019-09-09 MED ORDER — QUETIAPINE FUMARATE 50 MG PO TABS: 50.0000 mg | ORAL_TABLET | Freq: Every day | ORAL | Status: DC

## 2019-09-09 MED ORDER — CHLORHEXIDINE GLUCONATE 0.12 % MT SOLN: 15.0000 mL | Freq: Two times a day (BID) | OROMUCOSAL | 0 refills | Status: AC

## 2019-09-09 NOTE — Progress Notes (Signed)
Chaplain responded to page for notary of POA.  Discerning with nurse this was not a HCPOA, Chaplain explained notarizing a Durable POA was beyond the scope of her duties.  Chaplain asked staff to please convey regrets to patient.  Vernell Morgans Chaplain Resident

## 2019-09-09 NOTE — Progress Notes (Signed)
Physical Therapy Session Note  Patient Details  Name: Jeremiah West MRN: [id number] Date of Birth: 1950/09/14  Today's Date: 09/09/2019 PT Individual Time: 1000-1053 PT Individual Time Calculation (min): 53 min   Short Term Goals: Week 3:  PT Short Term Goal 1 (Week 3): Pt will initiate functional tasks w/ min cues 100% of the time PT Short Term Goal 2 (Week 3): Pt will ambulate 69' w/ min assist w/ LRAD consistently PT Short Term Goal 3 (Week 3): Pt will maintain selective attention to functional tasks in controlled environment w/ min assist  Skilled Therapeutic Interventions/Progress Updates:    Pt received supine in bed and agreeable to PT. Supine>sit transfer with supervision assist and cues for safety once at EOB. Stand pivot transfer to [facility] with CGA and pt supporting UE on arm rests. Once in [facility], pt requests to toilet. Able to urinate (+) standing at toilet . Noted poor control resulting in need to change pants. PT obtained pants and pt able to change with supervision assist at sink. Sit<>stnd with supervision assist to pull to pants. Pt transported to rehab gym. nustep sustained attention task x 8 min, level 4 with cues for target time. Gait training with 1 arm over therapist shoulder 2 x 33f with min assist. Cognitive task of connect 4 x 3 bouts. Pt able to sustain attention to task, but unable to make meaningful moves for game. Patient returned to RN station [facility].          Therapy Documentation Precautions:  Precautions Precautions: Fall Precaution Comments: fall risk. no longer in need of restraints Restrictions Weight Bearing Restrictions: No   Pain: denies   Therapy/Group: Individual Therapy  ALorie Phenix5/21/2021, 6:33 PM

## 2019-09-09 NOTE — Progress Notes (Signed)
Patient ID: Jeremiah West, male   DOB: [birth year], 69 y.o.   MRN: [id number] Spoke with sister re guarding SNF placement. She stated she has chosen  [facility] in [place], [place]. Informed sister that Mr. Derner could possibly leave on Monday, 09/12/19. Will inform the sister when she can go to [facility] and sign him in.

## 2019-09-09 NOTE — Plan of Care (Signed)
  Problem: RH Swallowing Goal: LTG Patient will consume least restrictive diet using compensatory strategies with assistance (SLP) Description: LTG:  Patient will consume least restrictive diet using compensatory strategies with assistance (SLP) Flowsheets (Taken 09/09/2019 1455) LTG: Pt Patient will consume least restrictive diet using compensatory strategies with assistance of (SLP): Moderate Assistance - Patient 50 - 74% Note: Downgraded due to lack of progress   Problem: RH Comprehension Communication Goal: LTG Patient will comprehend basic/complex auditory (SLP) Description: LTG: Patient will comprehend basic/complex auditory information with cues (SLP). Flowsheets (Taken 09/09/2019 1455) LTG: Patient will comprehend auditory information with cueing (SLP): Minimal Assistance - Patient > 75% Note: Downgraded due to lack of progress   Problem: RH Problem Solving Goal: LTG Patient will demonstrate problem solving for (SLP) Description: LTG:  Patient will demonstrate problem solving for basic/complex daily situations with cues  (SLP) Flowsheets (Taken 09/09/2019 1455) LTG Patient will demonstrate problem solving for: Maximal Assistance - Patient 25 - 49% Note: Downgraded due to lack of progress   Problem: RH Attention Goal: LTG Patient will demonstrate this level of attention during functional activites (SLP) Description: LTG:  Patient will will demonstrate this level of attention during functional activites (SLP) Flowsheets (Taken 09/09/2019 1455) Patient will demonstrate during cognitive/linguistic activities the attention type of: Sustained LTG: Patient will demonstrate this level of attention during cognitive/linguistic activities with assistance of (SLP): Moderate Assistance - Patient 50 - 74% Number of minutes patient will demonstrate attention during cognitive/linguistic activities: 10 minutes Note: Downgraded due to lack of progress   Problem: RH Awareness Goal: LTG: Patient will  demonstrate awareness during functional activites type of (SLP) Description: LTG: Patient will demonstrate awareness during functional activites type of (SLP) Flowsheets (Taken 09/09/2019 1455) LTG: Patient will demonstrate awareness during cognitive/linguistic activities with assistance of (SLP): Maximal Assistance - Patient 25 - 49% Note: Downgraded due to lack of progress

## 2019-09-09 NOTE — Progress Notes (Signed)
Occupational Therapy Note  Patient Details  Name: Jeremiah West MRN: [id number] Date of Birth: Aug 27, 1950  Today's Date: 09/09/2019 OT Missed Time: 30 Minutes Missed Time Reason: Patient unwilling/refused to participate without medical reason  Pt received direct handoff from NT. Pt walks from bathroom to sink with MOD A and No AD to wash hands at sink. Pt completes stand pivot to EOB. Pt refuses to complete any tx. Exited session with pt seated in bed, exit alarm on and call light in reach  Shon Hale 09/09/2019, 1:59 PM

## 2019-09-09 NOTE — Progress Notes (Signed)
Patient ID: Jeremiah West, male   DOB: 1950/05/15, 69 y.o.   MRN: [id number]    Updates indicate sister has accepted bed offer:[facility] [place] [facility] ([place], [place]).   SW spoke with Laurie/Liaison for [employer] [phone]) who will submit authorization to insurance. SNF bed pending.    Cecile Sheerer, MSW, LCSWA Office: [phone] Cell: [phone] Fax: [phone]

## 2019-09-09 NOTE — Progress Notes (Signed)
Late Entry**  On the evening of 5/19, NT assisted pt to use urinal standing next to recliner. After using urinal, pt sat on recliner refused to put chair alarm. Pt given a choice of getting in bed vs sitting on recliner with chair alarm on. Pt stated he is "not doing sh*t". Yelled at and asked staff to exit room stating "leave me the f*ck alone." pt was yelling and cussing pt loudly, unable to follow directions or cooperate with care. During these exchanges primary nurse arrived to room. This RN left pt in room with primary nurse and NT to assist another pt.  Pt was then brought to nurses station 15-20 minutes on wheelchair by NT. After few minute at nurse's station , pt started to wheel himself out toward the hallway and exit. Nursing decided to close the doors to prevent elopement until pt is able to follow directions.

## 2019-09-09 NOTE — Progress Notes (Signed)
Speech Language Pathology Weekly Progress and Session Note  Patient Details  Name: Jeremiah West MRN: [id number] Date of Birth: 11/09/1950  Beginning of progress report period: Sep 02, 2019 End of progress report period: Sep 09, 2019  Today's Date: 09/09/2019 SLP Individual Time: 2197-5883 SLP Individual Time Calculation (min): 25 min  Short Term Goals: Week 3: SLP Short Term Goal 1 (Week 3): Patient will demonstrate sustained attention to functional tasks for 15 minutes with Mod verbal cues for redirection. SLP Short Term Goal 1 - Progress (Week 3): Not met SLP Short Term Goal 2 (Week 3): Patient will demonstrate orientation X 4 with Max A multimodal cues. SLP Short Term Goal 2 - Progress (Week 3): Met SLP Short Term Goal 3 (Week 3): Patient will demonstrate basic problem solving for basic and familiar tasks with Mod A verbal and visual cues. SLP Short Term Goal 3 - Progress (Week 3): Not met SLP Short Term Goal 4 (Week 3): Patient will verbalize basic wants/needs at the phrase level with Min verbal cues. SLP Short Term Goal 4 - Progress (Week 3): Met SLP Short Term Goal 5 (Week 3): Patient will consume current diet with minimal overt s/s of aspiration with Mod verbal cues use of swallowing compensatory strategies. SLP Short Term Goal 5 - Progress (Week 3): Met SLP Short Term Goal 6 (Week 3): Patient will demonstrate efficient mastication and complete oral clerance with trials of Dys. 3 textures over 2 sessions without overt s/s of aspiration and Mod verbal cues. SLP Short Term Goal 6 - Progress (Week 3): Not met    New Short Term Goals: Week 4: SLP Short Term Goal 1 (Week 4): STGs=LTGs due to ELOS  Weekly Progress Updates: Patient has made functional gains and has met 3 of 6 STGs this reporting period. Currently, patient is consuming Dys. 2 textures with thin liquids with minimal overt s.s of aspiration and requires overall Min-Mod A verbal cues for use of swallowing compensatory  strategies. Patient continues to demonstrate behaviors consistent with a [place] Level VI and requires overall Max A to complete functional and familiar tasks safely in regards to problem solving, attention, recall and awareness. Patient and family education ongoing. Patient would benefit from continued skilled SLP intervention to maximize his cognitive and swallowing function prior to discharge.      Intensity: Minumum of 1-2 x/day, 30 to 90 minutes Frequency: 3 to 5 out of 7 days Duration/Length of Stay: 5/24-SNF placement Treatment/Interventions: Cognitive remediation/compensation;Internal/external aids;Dysphagia/aspiration precaution training;Speech/Language facilitation;Therapeutic Activities;Environmental controls;Cueing hierarchy;Functional tasks;Patient/family education   Daily Session  Skilled Therapeutic Interventions: Skilled treatment session focused on cognitive goals. SLP facilitated session by providing Min A visual cues for orientation to city and total A for orientation to year. However, patient was independently oriented to month, place and situation. Patient also verbalized 1 task that he completed in PT this morning independently! Patient sorted coins from a field of 4 with Min verbal cues but required Max-Total A for basic problem solving and sustained attention while attempting to generate specific amounts of change. Patient left upright in bed with alarm on and all needs within reach. Continue with current plan of care.      Pain No/Denies Pain  Therapy/Group: Individual Therapy  PAYNE, COURTNEY 09/09/2019, 2:54 PM

## 2019-09-09 NOTE — Plan of Care (Signed)
  Problem: RH Ambulation Goal: LTG Patient will ambulate in controlled environment (PT) Description: LTG: Patient will ambulate in a controlled environment, # of feet with assistance (PT). Flowsheets (Taken 09/09/2019 1901) LTG: Pt will ambulate in controlled environ  assist needed:: Minimal Assistance - Patient > 75% LTG: Ambulation distance in controlled environment: (Goal downgraded 5/21 due to slow progress - Khyri Hinzman T) 35' w/ LRAD Note: Goal downgraded 5/21 due to slow progress - Arvetta Araque T   Problem: RH Memory Goal: LTG Patient will demonstrate ability for day to day recall/carry over during activities of daily living with assistance level (PT) Description: LTG:  Patient will demonstrate ability for day to day recall/carry over during activities of daily living with assistance level (PT). Flowsheets (Taken 09/09/2019 1901) LTG:  Patient will demonstrate ability for day to day recall/carry over during activities of daily living with assistance level (PT): (Goal downgraded 5/21 due to slow progress - Rhone Ozaki T) Moderate Assistance - Patient 50 - 74% Note: Goal downgraded 5/21 due to slow progress - Akira Adelsberger T   Problem: RH Attention Goal: LTG Patient will demonstrate this level of attention during functional activites (PT) Description: LTG:  Patient will demonstrate this level of attention during functional activites (PT) Flowsheets Taken 09/09/2019 1901 Patient will demonstrate this level of attention during functional activites: (Goal downgraded 5/21 due to slow progress - Mikiyah Glasner T) Sustained Patient will demonstrate above attention level in the following environment: Controlled Taken 08/19/2019 1851 LTG: Patient will demonstrate attention during functional mobility with assistance of: Minimal Assistance - Patient > 75% Note: Goal downgraded 5/21 due to slow progress - Man[place] Strieter T   Problem: RH Awareness Goal: LTG: Patient will demonstrate awareness during functional activites type of (PT) Description: LTG:  Patient will demonstrate awareness during functional activites type of (PT) Flowsheets (Taken 09/09/2019 1901) Patient will demonstrate awareness during functional activites type of: Intellectual LTG: Patient will demonstrate awareness during functional activites type of (PT): (Goal downgraded 5/21 due to slow progress - Denali Sharma T) Maximal Assistance - Patient 25 - 49% Note: Goal downgraded 5/21 due to slow progress - Tashina Credit T

## 2019-09-09 NOTE — Progress Notes (Addendum)
Physical Therapy Weekly Progress Note  Patient Details  Name: CAELIN RAYL MRN: [id number] Date of Birth: Dec 08, 1950  Beginning of progress report period: Sep 02, 2019 End of progress report period: Sep 09, 2019  Patient has met 2 of 3 short term goals. Pt continues to make slow progress towards LTGs. He is consistently performing all mobility w/ supervision-min assist. Pt needs most physical assist for gait, ambulating up to 50' consistently using RW. He also demonstrates improved carryover of technique for functional tasks, needing only min-mod cues for technique and sequencing. Initiation deficits remain, but appear more behavioral in nature.   Patient continues to demonstrate the following deficits muscle weakness, decreased cardiorespiratoy endurance, impaired timing and sequencing, unbalanced muscle activation, decreased coordination and decreased motor planning, decreased attention to right, decreased initiation, decreased attention, decreased awareness, decreased problem solving, decreased safety awareness, decreased memory and delayed processing and decreased standing balance, decreased postural control and decreased balance strategies and therefore will continue to benefit from skilled PT intervention to increase functional independence with mobility.  Patient not progressing toward long term goals.  See goal revision..  Plan of care revisions: see plan of care update on 5/21 for plan of care revisions.  PT Short Term Goals Week 3:  PT Short Term Goal 1 (Week 3): Pt will initiate functional tasks w/ min cues 100% of the time PT Short Term Goal 1 - Progress (Week 3): Met PT Short Term Goal 2 (Week 3): Pt will ambulate 60' w/ min assist w/ LRAD consistently PT Short Term Goal 2 - Progress (Week 3): Progressing toward goal PT Short Term Goal 3 (Week 3): Pt will maintain selective attention to functional tasks in controlled environment w/ min assist PT Short Term Goal 3 - Progress (Week  3): Met Week 4:  PT Short Term Goal 1 (Week 4): =LTGs due to ELOS  Erial Fikes K Klein Willcox 09/09/2019, 6:52 PM

## 2019-09-09 NOTE — Progress Notes (Signed)
[place] PHYSICAL MEDICINE & REHABILITATION PROGRESS NOTE   Subjective/Complaints: Pt sitting comfortably in bed. No new issues  ROS: Limited due to cognitive/behavioral    Objective:   No results found. No results for input(s): WBC, HGB, HCT, PLT in the last 72 hours. No results for input(s): NA, K, CL, CO2, GLUCOSE, BUN, CREATININE, CALCIUM in the last 72 hours.  Intake/Output Summary (Last 24 hours) at 09/09/2019 1203 Last data filed at 09/09/2019 0900 Gross per 24 hour  Intake 240 ml  Output 100 ml  Net 140 ml     Physical Exam: Vital Signs Blood pressure 109/76, pulse 76, temperature 98.6 F (37 C), temperature source Oral, resp. rate 16, height 5\' 7"  (1.702 m), weight 71.3 kg, SpO2 98 %.  Constitutional: No distress . Vital signs reviewed. HEENT: EOMI, oral membranes moist Neck: supple Cardiovascular: RRR without murmur. No JVD    Respiratory/Chest: CTA Bilaterally without wheezes or rales. Normal effort    GI/Abdomen: BS +, non-tender, non-distended Ext: no clubbing, cyanosis, or edema Psych: pleasantly confused.   Musc: No edema in extremities.  No tenderness in extremities. Neuro: alert, word finding deficits. AOx1 only Motor: moves all 4's. Normal sensation  Assessment/Plan: 1. Functional deficits secondary to TBI which require 3+ hours per day of interdisciplinary therapy in a comprehensive inpatient rehab setting.  Physiatrist is providing close team supervision and 24 hour management of active medical problems listed below.  Physiatrist and rehab team continue to assess barriers to discharge/monitor patient progress toward functional and medical goals  Care Tool:  Bathing  Bathing activity did not occur: Refused Body parts bathed by patient: Right arm, Left arm, Chest, Abdomen, Front perineal area, Right upper leg, Left upper leg, Face, Right lower leg, Left lower leg   Body parts bathed by helper: Buttocks     Bathing assist Assist Level: Minimal  Assistance - Patient > 75%     Upper Body Dressing/Undressing Upper body dressing   What is the patient wearing?: Pull over shirt    Upper body assist Assist Level: Supervision/Verbal cueing    Lower Body Dressing/Undressing Lower body dressing      What is the patient wearing?: Pants, Incontinence brief     Lower body assist Assist for lower body dressing: Minimal Assistance - Patient > 75%     Toileting Toileting    Toileting assist Assist for toileting: Moderate Assistance - Patient 50 - 74%(assit with wheelchair)     Transfers Chair/bed transfer  Transfers assist  Chair/bed transfer activity did not occur: Safety/medical concerns  Chair/bed transfer assist level: Contact Guard/Touching assist     Locomotion Ambulation   Ambulation assist   Ambulation activity did not occur: Refused(pt refused gait w/o AD)  Assist level: Minimal Assistance - Patient > 75% Assistive device: Other (comment)(UE over therapist's shoulder) Max distance: 50'   Walk 10 feet activity   Assist  Walk 10 feet activity did not occur: Refused  Assist level: Minimal Assistance - Patient > 75% Assistive device: Walker-rolling   Walk 50 feet activity   Assist Walk 50 feet with 2 turns activity did not occur: (did not perform turning w/gait)  Assist level: Minimal Assistance - Patient > 75% Assistive device: Walker-rolling    Walk 150 feet activity   Assist           Walk 10 feet on uneven surface  activity   Assist Walk 10 feet on uneven surfaces activity did not occur: Refused  Programmer, multimedia activity did not occur: N/A         Wheelchair 50 feet with 2 turns activity    Assist    Wheelchair 50 feet with 2 turns activity did not occur: N/A       Wheelchair 150 feet activity     Assist  Wheelchair 150 feet activity did not occur: N/A       Blood pressure 109/76, pulse 76, temperature 98.6 F (37 C),  temperature source Oral, resp. rate 16, height 5\' 7"  (1.702 m), weight 71.3 kg, SpO2 98 %.  Medical Problem List and Plan: 1.Functional and cognitive deficitssecondary to TBI with skull fracture  Continue CIR  -RLAS V, slow progress likely linked to significant ETOH abuse, aphasic also  -5/21 SNF placement pending for 09/12/19 2. Antithrombotics: -DVT/anticoagulation:Pharmaceutical:Lovenox -antiplatelet therapy: N/a 3. Pain Management:Ultram and/or oxycodone prn.    -continue scheduled tylenol qid for now.well controlled 4. Mood:LCSW to follow for evaluation and support when appropriate. -antipsychotic agents: seroquel 5. Neuropsych: This patientis notcapable of making decisions on hisown behalf.  Regular bed now   RLAS V   -5/21 continue on seroquel in late afternoon (1600) and at HS to help with sleep and PM agitation 6. Skin/Wound Care:routine pressure relief measures. 7. Fluids/Electrolytes/Nutrition:good appetite  -f/u labs all reviewed 8. Agitation: At risk for falls.    - environmental mods  -continue propranolol when willing to use  -seroquel  Telesitter  9. Hyperglycemia:  Resolved off tf  10. History of polysubstance abuse: Continue thiamine and folic acid.  11. Dysphagia: D2 thins for now  Eating well   -recent labs acceptable   14.  Hyponatremia  Sodium 133 5/17   LOS: 22 days A FACE TO [place] 09/09/2019, 12:03 PM

## 2019-09-10 NOTE — Progress Notes (Signed)
Occupational Therapy Discharge Summary  Patient Details  Name: Jeremiah West MRN: [id number] Date of Birth: 1950/09/07   Patient has met 12 of 38 long term goals due to improved balance, postural control, ability to compensate for deficits and functional use of  RIGHT upper and RIGHT lower extremity.  Patient to discharge at [facility] Supervision- [place] level.  Patient's discharging SNF d/t family unable to provide 24/7 supervision needed. Sister did compelte family education throughout LOS. Pt has had slow rehab course d/t refusal to participate and cognitive deficits.   Reasons goals not met: Pt with no awareness of deficits since TBI deqpite profuse educations  Recommendation:  Patient will benefit from ongoing skilled OT services in skilled nursing facility setting to continue to advance functional skills in the area of BADL.  Equipment: No equipment provided  Reasons for discharge: treatment goals met and discharge from hospital  Patient/family agrees with progress made and goals achieved: Yes  OT Discharge Precautions/Restrictions  Precautions Precautions: Fall Precaution Comments: fall risk. no longer in need of restraints Restrictions Weight Bearing Restrictions: No General   Vital Signs   Pain   ADL ADL Grooming: Supervision/safety Where Assessed-Grooming: Sitting at sink Upper Body Bathing: Moderate assistance, Supervision/safety Where Assessed-Upper Body Bathing: Sitting at sink Lower Body Bathing: Moderate assistance, Contact guard Where Assessed-Lower Body Bathing: Sitting at sink Upper Body Dressing: Supervision/safety Where Assessed-Upper Body Dressing: Sitting at sink, Standing at sink Lower Body Dressing: Contact guard Where Assessed-Lower Body Dressing: Sitting at sink, Standing at sink Toileting: Contact guard Where Assessed-Toileting: Glass blower/designer: Therapist, music Method: Arts development officer: Grab  bars Vision Baseline Vision/History: Legally blind Additional Comments: R eye blindness and poor depth perception Perception  Perception: Impaired Inattention/Neglect: Does not attend to right visual field;Does not attend to right side of body(improved since eval) Praxis Praxis: Impaired Praxis Impairment Details: Motor planning;Initiation;Ideomotor;Perseveration Cognition Overall Cognitive Status: Impaired/Different from baseline Arousal/Alertness: Awake/alert Orientation Level: Oriented to person;Oriented to place;Oriented to situation;Disoriented to time(with external aides) Attention: Sustained Sustained Attention: Impaired Memory: Impaired Memory Impairment: Decreased recall of new information;Decreased short term memory [employer] Scales of Cognitive Functioning: Confused/appropriate Sensation Sensation Light Touch: Appears Intact Coordination Gross Motor Movements are Fluid and Coordinated: Yes Fine Motor Movements are Fluid and Coordinated: No Coordination and Movement Description: slow movement, unable to follow motor commands for formal testing Motor  Motor Motor: Motor apraxia;Abnormal postural alignment and control Motor - Skilled Clinical Observations: 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 Mobility  Transfers Sit to Stand: Contact Guard/Touching assist;Supervision/Verbal cueing Stand to Sit: Contact Guard/Touching assist;Supervision/Verbal cueing  Trunk/Postural Assessment  Cervical Assessment Cervical Assessment: (head forward) Thoracic Assessment Thoracic Assessment: Within Functional Limits Lumbar Assessment Lumbar Assessment: (post pelvic tilt) Postural Control Postural Control: Deficits on evaluation(delayed/insufficient)  Balance Static Sitting Balance Static Sitting - Level of Assistance: 5: Stand by assistance Dynamic Sitting Balance Dynamic Sitting - Level of Assistance: 5: Stand by assistance Static Standing Balance Static Standing - Level of  Assistance: 4: Min assist Dynamic Standing Balance Dynamic Standing - Level of Assistance: 4: Min assist Extremity/Trunk Assessment RUE Assessment RUE Assessment: Exceptions to [facility] General Strength Comments: generalized weakness, discoordination RUE Body System: Neuro Brunstrum levels for arm and hand: Arm;Hand Brunstrum level for arm: Stage V Relative Independence from Synergy Brunstrum level for hand: Stage VI Isolated joint movements LUE Assessment LUE Assessment: Exceptions to [facility] General Strength Comments: generalized weakness   Tonny Branch 09/10/2019, 12:49 PM

## 2019-09-10 NOTE — Progress Notes (Signed)
Occupational Therapy Weekly Progress Note  Patient Details  Name: Jeremiah West MRN: [id number] Date of Birth: November 29, 1950  Beginning of progress report period: Sep 02, 2019 End of progress report period: Sep 10, 2019  Patient has met 0 of 2 short term goals.  Pt has made minimal progress this week d/t refusal to particpiate in tx session. Pt continues to remain at [facility] for any functional mobility or transitional movements within ADL. Pt with minor improvement in orientation using external aides on wall to recall 3/4 orientation questions.   Patient continues to demonstrate the following deficits: muscle weakness, decreased cardiorespiratoy endurance, impaired timing and sequencing, unbalanced muscle activation, motor apraxia, decreased coordination and decreased motor planning, decreased visual acuity and decreased visual motor skills, decreased attention to right, decreased motor planning and ideational apraxia, decreased attention, decreased awareness, decreased problem solving, decreased safety awareness, decreased memory and delayed processing and decreased sitting balance, decreased standing balance, decreased postural control, hemiplegia and decreased balance strategies and therefore will continue to benefit from skilled OT intervention to enhance overall performance with BADL.  Patient progressing toward long term goals..  Continue plan of care.    Skilled Therapeutic Interventions/Progress Updates:      Therapy Documentation Precautions:  Precautions Precautions: Fall Precaution Comments: fall risk. no longer in need of restraints Restrictions Weight Bearing Restrictions: No General:   Vital Signs:   Pain:   ADL: ADL Grooming: Supervision/safety Where Assessed-Grooming: Sitting at sink Upper Body Bathing: Moderate assistance, Supervision/safety Where Assessed-Upper Body Bathing: Sitting at sink Lower Body Bathing: Moderate assistance, Contact guard Where  Assessed-Lower Body Bathing: Sitting at sink Upper Body Dressing: Supervision/safety Where Assessed-Upper Body Dressing: Sitting at sink, Standing at sink Lower Body Dressing: Contact guard Where Assessed-Lower Body Dressing: Sitting at sink, Standing at sink Toileting: Contact guard Where Assessed-Toileting: Glass blower/designer: Therapist, music Method: Arts development officer: Grab bars Vision Baseline Vision/History: Legally blind Additional Comments: R eye blindness and poor depth perception Perception  Perception: Impaired Inattention/Neglect: Does not attend to right visual field;Does not attend to right side of body(improved since eval) Praxis Praxis: Impaired Praxis Impairment Details: Motor planning;Initiation;Ideomotor;Perseveration Exercises:   Other Treatments:      Tonny Branch 09/10/2019, 12:52 PM

## 2019-09-10 NOTE — Progress Notes (Signed)
[place] PHYSICAL MEDICINE & REHABILITATION PROGRESS NOTE   Subjective/Complaints:  Pt reports "help me if you can".   ROS: limited due to cognition    Objective:   No results found. No results for input(s): WBC, HGB, HCT, PLT in the last 72 hours. No results for input(s): NA, K, CL, CO2, GLUCOSE, BUN, CREATININE, CALCIUM in the last 72 hours.  Intake/Output Summary (Last 24 hours) at 09/10/2019 1641 Last data filed at 09/10/2019 1419 Gross per 24 hour  Intake 380 ml  Output 500 ml  Net -120 ml     Physical Exam: Vital Signs Blood pressure 112/63, pulse 89, temperature 98.1 F (36.7 C), resp. rate 16, height 5\' 7"  (1.702 m), weight 71 kg, SpO2 100 %.  Constitutional: No distress . Vital signs reviewed.laying in ball in bed; woke to verbal stimuli, NAD HEENT: EOMI, oral membranes moist Neck: supple Cardiovascular: RRR    Respiratory/Chest: CTA B/L- no W/R/R- good air movement GI/Abdomen:Soft, NT, ND, (+)BS  Ext: no clubbing, cyanosis, or edema Psych:pleasantly confused  Musc: No edema in extremities.  No tenderness in extremities. Neuro: alert, word finding deficits. Ox1-tto self only Motor: moves all 4's. Normal sensation  Assessment/Plan: 1. Functional deficits secondary to TBI which require 3+ hours per day of interdisciplinary therapy in a comprehensive inpatient rehab setting.  Physiatrist is providing close team supervision and 24 hour management of active medical problems listed below.  Physiatrist and rehab team continue to assess barriers to discharge/monitor patient progress toward functional and medical goals  Care Tool:  Bathing  Bathing activity did not occur: Refused Body parts bathed by patient: Right arm, Left arm, Chest, Abdomen, Front perineal area, Right upper leg, Left upper leg, Face, Right lower leg, Left lower leg   Body parts bathed by helper: Buttocks     Bathing assist Assist Level: Minimal Assistance - Patient > 75%     Upper Body  Dressing/Undressing Upper body dressing   What is the patient wearing?: Pull over shirt    Upper body assist Assist Level: Supervision/Verbal cueing    Lower Body Dressing/Undressing Lower body dressing      What is the patient wearing?: Pants, Incontinence brief     Lower body assist Assist for lower body dressing: Minimal Assistance - Patient > 75%     Toileting Toileting    Toileting assist Assist for toileting: Moderate Assistance - Patient 50 - 74%(assit with wheelchair)     Transfers Chair/bed transfer  Transfers assist  Chair/bed transfer activity did not occur: Safety/medical concerns  Chair/bed transfer assist level: Contact Guard/Touching assist     Locomotion Ambulation   Ambulation assist   Ambulation activity did not occur: Refused(pt refused gait w/o AD)  Assist level: Minimal Assistance - Patient > 75% Assistive device: Other (comment)(UE over therapist's shoulder) Max distance: 50'   Walk 10 feet activity   Assist  Walk 10 feet activity did not occur: Refused  Assist level: Minimal Assistance - Patient > 75% Assistive device: Walker-rolling   Walk 50 feet activity   Assist Walk 50 feet with 2 turns activity did not occur: (did not perform turning w/gait)  Assist level: Minimal Assistance - Patient > 75% Assistive device: Walker-rolling    Walk 150 feet activity   Assist           Walk 10 feet on uneven surface  activity   Assist Walk 10 feet on uneven surfaces activity did not occur: Refused  Programmer, multimedia activity did not occur: N/A         Wheelchair 50 feet with 2 turns activity    Assist    Wheelchair 50 feet with 2 turns activity did not occur: N/A       Wheelchair 150 feet activity     Assist  Wheelchair 150 feet activity did not occur: N/A       Blood pressure 112/63, pulse 89, temperature 98.1 F (36.7 C), resp. rate 16, height 5\' 7"  (1.702 m),  weight 71 kg, SpO2 100 %.  Medical Problem List and Plan: 1.Functional and cognitive deficitssecondary to TBI with skull fracture  Continue CIR  -RLAS V, slow progress likely linked to significant ETOH abuse, aphasic also  5/22- SNF placement Monday 2. Antithrombotics: -DVT/anticoagulation:Pharmaceutical:Lovenox -antiplatelet therapy: N/a 3. Pain Management:Ultram and/or oxycodone prn.    -continue scheduled tylenol qid for now.well controlled 4. Mood:LCSW to follow for evaluation and support when appropriate. -antipsychotic agents: seroquel 5. Neuropsych: This patientis notcapable of making decisions on hisown behalf.  Regular bed now   RLAS V   -5/21 continue on seroquel in late afternoon (1600) and at HS to help with sleep and PM agitation 6. Skin/Wound Care:routine pressure relief measures. 7. Fluids/Electrolytes/Nutrition:good appetite  -f/u labs all reviewed 8. Agitation: At risk for falls.    - environmental mods  -continue propranolol when willing to use  -seroquel  Telesitter  9. Hyperglycemia:  Resolved off tf  10. History of polysubstance abuse: Continue thiamine and folic acid.  11. Dysphagia: D2 thins for now  Eating well   -recent labs acceptable   14.  Hyponatremia  Sodium 133 5/17   LOS: 23 days A FACE TO FACE EVALUATION WAS PERFORMED  Jeremiah West 09/10/2019, 4:41 PM

## 2019-09-11 ENCOUNTER — Inpatient Hospital Stay (HOSPITAL_COMMUNITY): Payer: Medicare Other | Admitting: Speech Pathology

## 2019-09-11 ENCOUNTER — Inpatient Hospital Stay (HOSPITAL_COMMUNITY): Payer: Medicare Other | Admitting: Physical Therapy

## 2019-09-11 MED ORDER — QUETIAPINE FUMARATE 25 MG PO TABS: 25.0000 mg | ORAL_TABLET | Freq: Every day | ORAL | Status: DC

## 2019-09-11 MED ORDER — TRAMADOL HCL 50 MG PO TABS: 50.0000 mg | ORAL_TABLET | Freq: Two times a day (BID) | ORAL | 0 refills | Status: DC | PRN

## 2019-09-11 NOTE — Progress Notes (Signed)
[facility] PHYSICAL MEDICINE & REHABILITATION PROGRESS NOTE   Subjective/Complaints:  Pt says feeling good- asking how I'm doing- repeating self constantly Denied pain "I'm fine".   ROS: limited due to cognition   Objective:   No results found. No results for input(s): WBC, HGB, HCT, PLT in the last 72 hours. No results for input(s): NA, K, CL, CO2, GLUCOSE, BUN, CREATININE, CALCIUM in the last 72 hours.  Intake/Output Summary (Last 24 hours) at 09/11/2019 1502 Last data filed at 09/11/2019 1244 Gross per 24 hour  Intake 500 ml  Output -  Net 500 ml     Physical Exam: Vital Signs Blood pressure 99/64, pulse 74, temperature 98.3 F (36.8 C), resp. rate 18, height 5\' 7"  (1.702 m), weight 70.3 kg, SpO2 96 %.  Constitutional: No distress .curled up in ball in bed; asleep, woke to verbal stimuli, NAD HEENT: EOMI, oral membranes moist Neck: supple Cardiovascular: RRR    Respiratory/Chest: CTA B/L- no W/R/R- good air movement GI/Abdomen:Soft, NT, ND, (+)BS  Ext: no clubbing, cyanosis, or edema Psych:pleasantly confused  Musc: No edema in extremities.  No tenderness in extremities. Neuro: alert, word finding deficits. Ox1-tto self only Motor: moves all 4's. Normal sensation  Assessment/Plan: 1. Functional deficits secondary to TBI which require 3+ hours per day of interdisciplinary therapy in a comprehensive inpatient rehab setting.  Physiatrist is providing close team supervision and 24 hour management of active medical problems listed below.  Physiatrist and rehab team continue to assess barriers to discharge/monitor patient progress toward functional and medical goals  Care Tool:  Bathing  Bathing activity did not occur: Refused Body parts bathed by patient: Right arm, Left arm, Chest, Abdomen, Front perineal area, Right upper leg, Left upper leg, Face, Right lower leg, Left lower leg   Body parts bathed by helper: Buttocks     Bathing assist Assist Level: Minimal  Assistance - Patient > 75%     Upper Body Dressing/Undressing Upper body dressing   What is the patient wearing?: Pull over shirt    Upper body assist Assist Level: Supervision/Verbal cueing    Lower Body Dressing/Undressing Lower body dressing      What is the patient wearing?: Pants, Incontinence brief     Lower body assist Assist for lower body dressing: Minimal Assistance - Patient > 75%     Toileting Toileting    Toileting assist Assist for toileting: Moderate Assistance - Patient 50 - 74%(assit with wheelchair)     Transfers Chair/bed transfer  Transfers assist  Chair/bed transfer activity did not occur: Safety/medical concerns  Chair/bed transfer assist level: Contact Guard/Touching assist     Locomotion Ambulation   Ambulation assist   Ambulation activity did not occur: Refused(pt refused gait w/o AD)  Assist level: Minimal Assistance - Patient > 75% Assistive device: Other (comment)(UE over therapist's shoulder) Max distance: 50'   Walk 10 feet activity   Assist  Walk 10 feet activity did not occur: Refused  Assist level: Minimal Assistance - Patient > 75% Assistive device: Walker-rolling   Walk 50 feet activity   Assist Walk 50 feet with 2 turns activity did not occur: (did not perform turning w/gait)  Assist level: Minimal Assistance - Patient > 75% Assistive device: Walker-rolling    Walk 150 feet activity   Assist           Walk 10 feet on uneven surface  activity   Assist Walk 10 feet on uneven surfaces activity did not occur: Refused  Engineer, drilling activity did not occur: N/A         Wheelchair 50 feet with 2 turns activity    Assist    Wheelchair 50 feet with 2 turns activity did not occur: N/A       Wheelchair 150 feet activity     Assist  Wheelchair 150 feet activity did not occur: N/A       Blood pressure 99/64, pulse 74, temperature 98.3 F (36.8  C), resp. rate 18, height 5\' 7"  (1.702 m), weight 70.3 kg, SpO2 96 %.  Medical Problem List and Plan: 1.Functional and cognitive deficitssecondary to TBI with skull fracture  Continue CIR  -RLAS V, slow progress likely linked to significant ETOH abuse, aphasic also  5/23- SNF placement Monday 5/24 2. Antithrombotics: -DVT/anticoagulation:Pharmaceutical:Lovenox -antiplatelet therapy: N/a 3. Pain Management:Ultram and/or oxycodone prn.    -continue scheduled tylenol qid for now.well controlled 4. Mood:LCSW to follow for evaluation and support when appropriate. -antipsychotic agents: seroquel 5. Neuropsych: This patientis notcapable of making decisions on hisown behalf.  Regular bed now   RLAS V   -5/21 continue on seroquel in late afternoon (1600) and at HS to help with sleep and PM agitation  5/23- haven't heard of agitation over the weekend  6. Skin/Wound Care:routine pressure relief measures. 7. Fluids/Electrolytes/Nutrition:good appetite  -f/u labs all reviewed 8. Agitation: At risk for falls.    - environmental mods  -continue propranolol when willing to use  -seroquel  Telesitter  9. Hyperglycemia:  Resolved off tf  10. History of polysubstance abuse: Continue thiamine and folic acid.  11. Dysphagia: D2 thins for now  Eating well   -recent labs acceptable   14.  Hyponatremia  Sodium 133 5/17   LOS: 24 days A FACE TO FACE EVALUATION WAS PERFORMED  Megan Lovorn 09/11/2019, 3:02 PM

## 2019-09-11 NOTE — Progress Notes (Addendum)
Speech Language Pathology Daily Therapy Session Note  Patient Details  Name: Jeremiah West MRN: [id number] Date of Birth: 1950-09-28  Today's Date: 09/11/2019 SLP Individual Time: 1245-1300 SLP Individual Time Calculation (min): 15 min   Skilled Therapeutic Interventions:  Pt was seen for skilled ST targeting cognition. Pt was resistant to participate in ST today. He refused upgraded trials of dysphagia 3 solids. Initially, he stated need to void, however once ST began to attempt to assist with safe transfer, pt stated he no longer wished to move. Despite encouragement, pt would not sit edge of bed to work toward walking to restroom. Pt independently oriented to place, however became frustrated with SLP's attempts to utilize conversation to further target cognitive goals and therefore session ended 15 minutes early. Pt left in bed with alarm set and needs within reach. Continue per current plan of care.      Edit 09/13/19 - Pt was inadvertently discharged - d/c plans changed and he did not transfer to SNF on anticipated timeline, therefore care plan will be reinitiated to continue to work toward swallow and cognitive-linguistic goals while inpatient.    [name] 09/11/2019, 7:15 AM

## 2019-09-11 NOTE — Progress Notes (Signed)
Physical Therapy Session Note  Patient Details  Name: Jeremiah West MRN: [id number] Date of Birth: [birth year]  Today's Date: 09/11/2019 PT Individual Time: 0915-0945 PT Individual Time Calculation (min): 30 min   Short Term Goals: Week 1:  PT Short Term Goal 1 (Week 1): Pt will tolerate 60 min of upright activity w/ minimal increase in fatigue PT Short Term Goal 1 - Progress (Week 1): Met PT Short Term Goal 2 (Week 1): Pt will initiate stair training PT Short Term Goal 2 - Progress (Week 1): Progressing toward goal PT Short Term Goal 3 (Week 1): Pt will ambulate 25' w/ mod assist w/ LRAD PT Short Term Goal 3 - Progress (Week 1): Progressing toward goal PT Short Term Goal 4 (Week 1): Pt will initiate functional tasks w/ min cues 50% of the time PT Short Term Goal 4 - Progress (Week 1): Met  Skilled Therapeutic Interventions/Progress Updates:  Pt was seen bedside in the am. Pt sleeping to but arouses to verbal stimuli. Pt willing to get up with therapist. Pt required frequent redirection to remain focused on task at hand. Pt performed all bed mobility with S. Pt performed all transfers with c/g and verbal cues with increased time. Pt performed toilet transfers with c/g. Pt urinated in toilet. Pt required a change of brief and pants. Pt returned to bed. Pt left sitting up in bed with all needs within reach and bed alarm on.   Therapy Documentation Precautions:  Precautions Precautions: Fall Precaution Comments: fall risk. no longer in need of restraints Restrictions Weight Bearing Restrictions: No General:   Pain: No c/o pain.   Therapy/Group: Individual Therapy  Dub Amis 09/11/2019, 12:13 PM

## 2019-09-12 ENCOUNTER — Inpatient Hospital Stay (HOSPITAL_COMMUNITY): Payer: Medicare Other | Admitting: Physical Therapy

## 2019-09-12 ENCOUNTER — Inpatient Hospital Stay (HOSPITAL_COMMUNITY): Payer: Medicare Other | Admitting: Occupational Therapy

## 2019-09-12 ENCOUNTER — Inpatient Hospital Stay (HOSPITAL_COMMUNITY): Payer: Medicare Other

## 2019-09-12 LAB — CBC
HCT: 43 % (ref 39.0–52.0)
Hemoglobin: 14.1 g/dL (ref 13.0–17.0)
MCH: 32.7 pg (ref 26.0–34.0)
MCHC: 32.8 g/dL (ref 30.0–36.0)
MCV: 99.8 fL (ref 80.0–100.0)
Platelets: 198 10*3/uL (ref 150–400)
RBC: 4.31 MIL/uL (ref 4.22–5.81)
RDW: 11.9 % (ref 11.5–15.5)
WBC: 3 10*3/uL — ABNORMAL LOW (ref 4.0–10.5)
nRBC: 0 % (ref 0.0–0.2)

## 2019-09-12 LAB — BASIC METABOLIC PANEL
Anion gap: 8 (ref 5–15)
BUN: 15 mg/dL (ref 8–23)
CO2: 29 mmol/L (ref 22–32)
Calcium: 9.6 mg/dL (ref 8.9–10.3)
Chloride: 103 mmol/L (ref 98–111)
Creatinine, Ser: 1.18 mg/dL (ref 0.61–1.24)
GFR calc Af Amer: >60 mL/min (ref >60)
GFR calc non Af Amer: >60 mL/min (ref >60)
Glucose, Bld: 102 mg/dL — ABNORMAL HIGH (ref 70–99)
Potassium: 4.1 mmol/L (ref 3.5–5.1)
Sodium: 140 mmol/L (ref 135–145)

## 2019-09-12 NOTE — Progress Notes (Signed)
[facility] PHYSICAL MEDICINE & REHABILITATION PROGRESS NOTE   Subjective/Complaints:  No new problems reported. Lying in bed just waking up when I came by this morning  ROS: Limited due to cognitive/behavioral   Objective:   No results found. Recent Labs    09/12/19 0655  WBC 3.0*  HGB 14.1  HCT 43.0  PLT 198   Recent Labs    09/12/19 0655  NA 140  K 4.1  CL 103  CO2 29  GLUCOSE 102*  BUN 15  CREATININE 1.18  CALCIUM 9.6    Intake/Output Summary (Last 24 hours) at 09/12/2019 1022 Last data filed at 09/11/2019 1837 Gross per 24 hour  Intake 240 ml  Output --  Net 240 ml     Physical Exam: Vital Signs Blood pressure 108/70, pulse 68, temperature 98 F (36.7 C), resp. rate 16, height 5\' 7"  (1.702 m), weight 70.6 kg, SpO2 98 %.  Constitutional: No distress . Vital signs reviewed. HEENT: EOMI, oral membranes moist Neck: supple Cardiovascular: RRR without murmur. No JVD    Respiratory/Chest: CTA Bilaterally without wheezes or rales. Normal effort    GI/Abdomen: BS +, non-tender, non-distended Ext: no clubbing, cyanosis, or edema Psych: confused  Musc: No edema in extremities.  No tenderness in extremities. Neuro: alert, word finding deficits. Oriented to self Motor: moves all 4's. Normal sensation  Assessment/Plan: 1. Functional deficits secondary to TBI which require 3+ hours per day of interdisciplinary therapy in a comprehensive inpatient rehab setting.  Physiatrist is providing close team supervision and 24 hour management of active medical problems listed below.  Physiatrist and rehab team continue to assess barriers to discharge/monitor patient progress toward functional and medical goals  Care Tool:  Bathing  Bathing activity did not occur: Refused Body parts bathed by patient: Right arm, Left arm, Chest, Abdomen, Front perineal area, Right upper leg, Left upper leg, Face, Right lower leg, Left lower leg   Body parts bathed by helper: Buttocks      Bathing assist Assist Level: Minimal Assistance - Patient > 75%     Upper Body Dressing/Undressing Upper body dressing   What is the patient wearing?: Pull over shirt    Upper body assist Assist Level: Supervision/Verbal cueing    Lower Body Dressing/Undressing Lower body dressing      What is the patient wearing?: Pants, Incontinence brief     Lower body assist Assist for lower body dressing: Minimal Assistance - Patient > 75%     Toileting Toileting    Toileting assist Assist for toileting: Moderate Assistance - Patient 50 - 74%(assit with wheelchair)     Transfers Chair/bed transfer  Transfers assist  Chair/bed transfer activity did not occur: Safety/medical concerns  Chair/bed transfer assist level: Contact Guard/Touching assist     Locomotion Ambulation   Ambulation assist   Ambulation activity did not occur: Refused(pt refused gait w/o AD)  Assist level: Minimal Assistance - Patient > 75% Assistive device: Other (comment)(UE over therapist's shoulder) Max distance: 50'   Walk 10 feet activity   Assist  Walk 10 feet activity did not occur: Refused  Assist level: Minimal Assistance - Patient > 75% Assistive device: Walker-rolling   Walk 50 feet activity   Assist Walk 50 feet with 2 turns activity did not occur: (did not perform turning w/gait)  Assist level: Minimal Assistance - Patient > 75% Assistive device: Walker-rolling    Walk 150 feet activity   Assist           Walk  10 feet on uneven surface  activity   Assist Walk 10 feet on uneven surfaces activity did not occur: Armed forces operational officer activity did not occur: N/A         Wheelchair 50 feet with 2 turns activity    Assist    Wheelchair 50 feet with 2 turns activity did not occur: N/A       Wheelchair 150 feet activity     Assist  Wheelchair 150 feet activity did not occur: N/A       Blood pressure 108/70,  pulse 68, temperature 98 F (36.7 C), resp. rate 16, height 5\' 7"  (1.702 m), weight 70.6 kg, SpO2 98 %.  Medical Problem List and Plan: 1.Functional and cognitive deficitssecondary to TBI with skull fracture  Continue CIR  -RLAS V, slow progress likely linked to significant ETOH abuse, aphasic also  SNF placement today  -f/u in the office to see me in 4-6 weeks 2. Antithrombotics: -DVT/anticoagulation:Pharmaceutical:Lovenox -antiplatelet therapy: N/a 3. Pain Management:Ultram and/or oxycodone prn.    -continue scheduled tylenol qid for now.well controlled 4. Mood:LCSW to follow for evaluation and support when appropriate. -antipsychotic agents: seroquel 5. Neuropsych: This patientis notcapable of making decisions on hisown behalf.  Regular bed now   RLAS V   -5/21 continue on seroquel in late afternoon (1600) and at HS to help with sleep and PM agitation  5/24--continue same regimen 6. Skin/Wound Care:routine pressure relief measures. 7. Fluids/Electrolytes/Nutrition:good appetite  -f/u labs all reviewed 8. Agitation: At risk for falls.    - environmental mods  -continue propranolol when willing to use  -seroquel  Telesitter  9. Hyperglycemia:  Resolved off tf  10. History of polysubstance abuse: Continue thiamine and folic acid.  11. Dysphagia: D2 thins for now  Eating well   -recent labs acceptable   14.  Hyponatremia  Sodium 133 5/17---> 140 today 5/24   LOS: 25 days A FACE TO FACE EVALUATION WAS PERFORMED  Meredith Staggers 09/12/2019, 10:22 AM

## 2019-09-12 NOTE — Progress Notes (Signed)
Physical Therapy Session Note  Patient Details  Name: Jeremiah West MRN: [id number] Date of Birth: 1950-09-25  Today's Date: 09/12/2019 PT Individual Time: 1230-1255 PT Individual Time Calculation (min): 25 min   Short Term Goals: Week 4:  PT Short Term Goal 1 (Week 4): =LTGs due to ELOS  Skilled Therapeutic Interventions/Progress Updates:   Pt in supine and agreeable to therapy/OOB activity w/ encouragement. No c/o pain throughout session. Supervision bed mobility. Therapist asking pt to ambulate to w/c set out in hallway. Pt repeatedly stated "I ain't going nowhere" even after encouragement. Therapist then simply requesting pt sit in w/c. Pt sitting EOB and stated "I'm already in the chair" despite multiple attempts at visually orienting him to environment. Pt eventually did transfer to w/c w/ CGA via stand pivot, verbal cues to scoot all the way back in chair. Pt ate lunch in w/c w/ supervision, verbal cues to open salt/pepper packets and to open drinks himself. Also verbal and visual cues for problem solving to switch between fork and spoon to more efficiently self-feed. Pt w/ mildly increased agitation this afternoon, refusing any further physical activity after eating lunch despite therapist encouraging him to participate in therapy for purposes of getting stronger and more independent. Pt does agree that he needs to get stronger before going home. He was alert and oriented to hospital and situation, not oriented to time or date. Pt remained sitting in w/c at end of session. Pt adamantly refusing to go to nurses station. Made RN aware that pt was alone in w/c, door open w/ telesitter present. Chair alarm also wrapped underneath armrests of w/c. All needs in reach.   Therapy Documentation Precautions:  Precautions Precautions: Fall Precaution Comments: fall risk. no longer in need of restraints Restrictions Weight Bearing Restrictions: No  Therapy/Group: Individual Therapy  Lemoyne Scarpati K  Raksha Wolfgang 09/12/2019, 1:00 PM

## 2019-09-12 NOTE — Progress Notes (Signed)
Occupational Therapy Session Note  Patient Details  Name: Jeremiah West MRN: [id number] Date of Birth: 1951-03-14  Today's Date: 09/12/2019 OT Individual Time: 1415-1430 OT Individual Time Calculation (min): 15 min    Short Term Goals: Week 1:  OT Short Term Goal 1 (Week 1): Pt will consistently transfer to toilet wiht MIN A OT Short Term Goal 1 - Progress (Week 1): Met OT Short Term Goal 2 (Week 1): Pt will thread BLE into pants demoing improved dressing praxis OT Short Term Goal 2 - Progress (Week 1): Met OT Short Term Goal 3 (Week 1): Pt will stand to groom with MIN A and MOD VC for sequncing OT Short Term Goal 3 - Progress (Week 1): Met OT Short Term Goal 4 (Week 1): Pt will bathe UB with MOD VC for initation OT Short Term Goal 4 - Progress (Week 1): Met Week 2:  OT Short Term Goal 1 (Week 2): Pt will terminate bathing with min VC OT Short Term Goal 1 - Progress (Week 2): Progressing toward goal OT Short Term Goal 2 (Week 2): Pt will don B socks iwht MIN A OT Short Term Goal 2 - Progress (Week 2): Met OT Short Term Goal 3 (Week 2): Pt will don pants with MIN A for standing balnace only OT Short Term Goal 3 - Progress (Week 2): Met OT Short Term Goal 4 (Week 2): Pt will orient clothing to self with min cuing OT Short Term Goal 4 - Progress (Week 2): Progressing toward goal Week 3:  OT Short Term Goal 1 (Week 3): Pt will participate in 80% of tx sessions OT Short Term Goal 1 - Progress (Week 3): Not met OT Short Term Goal 2 (Week 3): pt will terminate bathing with min VC OT Short Term Goal 2 - Progress (Week 3): Not met Week 4:  OT Short Term Goal 1 (Week 4): STG=LTG d/t ELOS      Skilled Therapeutic Interventions/Progress Updates:    Pt seen in w/c sitting at nursing station 30 min prior to session start time and pt alert and said yes that he would like to shower during his OT session. Gathered new scrub clothing, towel and soap for pt and retrieved pt from nursing station at  start of session.  He said he needed to go to bed.  I responded that he could right after he got a quick shower, and pt seemed to agree.  Took pt back to his room to have him stand and step into bathroom but he diverted to his bed and sat down on the edge of bed.  Attempted for several minutes to have pt go back to the bathroom but he kept saying "no I am okay, you just go ahead and shower".  Pt refused to shower or to lay down. Explained to patient that he could not stay at EOB that he needed to lay down.  Pt finally moved into supine and bed alarm set.  All 4 rails put up to ensure pt safe in his bed.  Call light in reach.  Therapy Documentation Precautions:  Precautions Precautions: Fall Precaution Comments: fall risk. no longer in need of restraints Restrictions Weight Bearing Restrictions: No    Vital Signs: Therapy Vitals Temp: 98 F (36.7 C) Pulse Rate: 68 Resp: 16 BP: 108/70 Patient Position (if appropriate): Lying Oxygen Therapy SpO2: 98 % O2 Device: Room Air Pain:   ADL: ADL Grooming: Supervision/safety Where Assessed-Grooming: Sitting at sink Upper Body Bathing: Moderate assistance,  Supervision/safety Where Assessed-Upper Body Bathing: Sitting at sink Lower Body Bathing: Moderate assistance, Contact guard Where Assessed-Lower Body Bathing: Sitting at sink Upper Body Dressing: Supervision/safety Where Assessed-Upper Body Dressing: Sitting at sink, Standing at sink Lower Body Dressing: Contact guard Where Assessed-Lower Body Dressing: Sitting at sink, Standing at sink Toileting: Contact guard Where Assessed-Toileting: Glass blower/designer: Therapist, music Method: Arts development officer: Grab bars  Therapy/Group: Individual Therapy  [place] 09/12/2019, 10:11 AM

## 2019-09-12 NOTE — Progress Notes (Signed)
Patient ID: Jeremiah West, male   DOB: [birth year], 69 y.o.   MRN: [id number]  SW followed up with Laurie/Liaison for [facility] [phone]) about authorization. Reports she is still waiting, and the facilities D.O.N. is reviewing to make sure they can extend a bed offer. SW waiting on follow-up.    Cecile Sheerer, MSW, LCSWA Office: [phone] Cell: [phone] Fax: [phone]

## 2019-09-13 ENCOUNTER — Inpatient Hospital Stay (HOSPITAL_COMMUNITY): Payer: Medicare Other | Admitting: Physical Therapy

## 2019-09-13 ENCOUNTER — Inpatient Hospital Stay (HOSPITAL_COMMUNITY): Payer: Medicare Other

## 2019-09-13 ENCOUNTER — Inpatient Hospital Stay (HOSPITAL_COMMUNITY): Payer: Medicare Other | Admitting: Speech Pathology

## 2019-09-13 NOTE — Progress Notes (Signed)
Physical Therapy Session Note  Patient Details  Name: Jeremiah West MRN: [id number] Date of Birth: October 15, 1950  Today's Date: 09/13/2019 PT Missed Time: 30 Minutes Missed Time Reason: Patient unwilling to participate  Made multiple attempts to visit w/ pt for PT. Pt refused each time, wanting to stay in bed. He was also perseverating on calling his sister. Missed 30 min of skilled PT.  Shakela Donati Melton Krebs 09/13/2019, 12:41 PM

## 2019-09-13 NOTE — Progress Notes (Signed)
Nutrition Follow-up  DOCUMENTATION CODES:   Non-severe (moderate) malnutrition in context of chronic illness  INTERVENTION:   - Continue MVI with minerals daily  -ContinueEnsure Enlive poTID, each supplement provides 350 kcal and 20 grams of protein  - Encourage adequate PO intake  NUTRITION DIAGNOSIS:   Moderate Malnutrition related to chronic illness (chronic medical issues and/or ETOH abuse) as evidenced by mild fat depletion, moderate muscle depletion.  Ongoing, being addressed via supplements  GOAL:   Patient will meet greater than or equal to 90% of their needs  Progressing  MONITOR:   PO intake, Supplement acceptance, Labs, Weight trends  REASON FOR ASSESSMENT:   Consult Enteral/tube feeding initiation and management, Calorie Count  ASSESSMENT:   69 year old male with PMH of depression, T2DM, polysubstance abuse, daily EtOH use. Pt was admitted after being found at the base of 16 stairs with slurred speech. Pt was found to have left frontal and temporal SDH, small left temporal SAH, nondisplaced fracture of right temporal calvarium extending to right mastoid and right mastoid effusion with hemorrhage. Other incidental findings include moderate right and small left hydrocele, contusion right hip, emphysema, multiple remote bilateral rib fractures, volume overload with interstitial edema, and calcified pleural plaques compatible with prior asbestos exposure. Pt is tolerating POs but tube feeds remain in place. Pt has had issues with fluctuating mental status with agitation and confusion requiring restraints. Admitted to CIR on 4/29.  5/02 - Cortrak removed  Weight stable.  Spoke with pt at bedside. Pt reports he is starving and hasn't had anything yet to eat today. Discussed with RN who reports pt missed breakfast because he was asleep. Per RN, pt taking Ensure supplements some of the time. Will continue current plan of care.  Meal Completion:  0-100%  Medications reviewed and include: colace, Ensure Enlive TID, folic acid, MVI with minerals daily, miralax, thiamine  Labs reviewed.  Diet Order:   Diet Order            DIET DYS 2 Room service appropriate? Yes; Fluid consistency: Thin  Diet effective now              EDUCATION NEEDS:   Not appropriate for education at this time  Skin:  Skin Assessment: Reviewed RN Assessment  Last BM:  09/12/19  Height:   Ht Readings from Last 1 Encounters:  08/18/19 5\' 7"  (1.702 m)    Weight:   Wt Readings from Last 1 Encounters:  09/13/19 70.8 kg    Ideal Body Weight:  67.3 kg  BMI:  Body mass index is 24.45 kg/m.  Estimated Nutritional Needs:   Kcal:  2000-2200  Protein:  100-115 grams  Fluid:  >/= 2.0 L    09/15/19, MS, RD, LDN Inpatient Clinical Dietitian Pager: [phone] Weekend/After Hours: [phone]
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
## 2019-09-13 NOTE — Progress Notes (Signed)
Occupational Therapy Session Note  Patient Details  Name: Jeremiah West MRN: [id number] Date of Birth: [birth year]  Today's Date: 09/13/2019 OT Individual Time: 1400-1445 OT Individual Time Calculation (min): 45 min    Short Term Goals: Week 4:  OT Short Term Goal 1 (Week 4): STG=LTG d/t ELOS  Skilled Therapeutic Interventions/Progress Updates:   Pt received supine initially declining any participation in any activity stating he was "watching movies". OT left the room to retrieve items to encourage participation and upon return pt was exiting bed, intercepted by staff. Without any cueing and only setting up activity pt initiated brushing teeth and washing teeth at the sink in w/c. Pt ambulated into bathroom with min A and he agreed to take shower. Pt completed bathing with cueing for thoroughness and to break perseverative washing. Pt donned shirt with (S) and pants with min A. Pt confused and not oriented throughout session, frequently requiring redirection. Pt was able to be redirected back to bed and he was left supine with bed alarm set.   Therapy Documentation Precautions:  Precautions Precautions: Fall Precaution Comments: fall risk. no longer in need of restraints Restrictions Weight Bearing Restrictions: No   Therapy/Group: Individual Therapy  Crissie Reese 09/13/2019, 7:30 AM

## 2019-09-13 NOTE — Progress Notes (Addendum)
Speech Language Pathology Session Note  Patient Details  Name: BRAILYN DELMAN MRN: [id number] Date of Birth: 01/12/1951  Today's Date: 09/13/2019 SLP Individual Time: 1100-1125 SLP Individual Time Calculation (min): 25 min  Short Term Goals: Week 4: SLP Short Term Goal 1 (Week 4): STGs=LTGs due to ELOS   Skilled Therapeutic Interventions: Skilled treatment session focused on cognitive goals. Upon arrival, patient was supine in bed and declined to get out of bed. Patient required total A for orientation to place, time and situation and was perseverative on that he was leaving today. Patient received a phone call and required total A to answer the phone but put the phone down because he needed to use the bathroom. Max A verbal cues were needed for safety with task due to confusion (yelling, "I need to answer it") even though he was sitting edge of bed and trying to walk to the bathroom. Patient was continent of bladder and required Min verbal cues for problem solving while washing his hands at the stink. Patient was transferred back to bed and handed off to PT. Continue with current plan of care.     Pain No/Denies Pain   Therapy/Group: Individual Therapy  Kitana Gage 09/13/2019, 12:37 PM

## 2019-09-13 NOTE — Progress Notes (Signed)
Patient ID: Jeremiah West, male   DOB: 03-17-1951, 69 y.o.   MRN: [id number]  SW received updates from [facility] stating pt will need a peer to peer for lower level of care request. Contact Medical Director at [phone] option #5. Deadline is at 12pm.  SW provided updates to attending.   SW received message from Maryan Rued with [facility] called SW to inform that insurance denied placement, and required peer to peer.   *Waiting on updates from insurance.   Cecile Sheerer, MSW, LCSWA Office: [phone] Cell: [phone] Fax: [phone]

## 2019-09-14 ENCOUNTER — Inpatient Hospital Stay (HOSPITAL_COMMUNITY): Payer: Medicare Other

## 2019-09-14 ENCOUNTER — Inpatient Hospital Stay (INDEPENDENT_AMBULATORY_CARE_PROVIDER_SITE_OTHER): Payer: Medicare Other | Admitting: Primary Care

## 2019-09-14 ENCOUNTER — Inpatient Hospital Stay (HOSPITAL_COMMUNITY): Payer: Medicare Other | Admitting: Speech Pathology

## 2019-09-14 ENCOUNTER — Inpatient Hospital Stay (HOSPITAL_COMMUNITY): Payer: Medicare Other | Admitting: Physical Therapy

## 2019-09-14 NOTE — Progress Notes (Signed)
Patient refused all night time medications. This nurse attempted on three separate occasions to give patient medication, but refused each time. Patient educated on importance, but still refused.

## 2019-09-14 NOTE — Progress Notes (Signed)
Speech Language Pathology Daily Session Note  Patient Details  Name: Jeremiah West MRN: [id number] Date of Birth: 01-15-1951  Today's Date: 09/14/2019 SLP Individual Time: 1220-1245 SLP Individual Time Calculation (min): 25 min  Short Term Goals: Week 4: SLP Short Term Goal 1 (Week 4): STGs=LTGs due to ELOS  Skilled Therapeutic Interventions: Skilled treatment session focused on cognitive and dysphagia goals. SLP facilitated session by providing extra time for tray set-up with lunch meal of Dys. 2 textures with thin liquids. Patient consumed meal without overt s/s of aspiration despite large bites and an increased rate of self-feeding. Patient demonstrated selective attention to self-feeding in a mildly distracting environment for ~15 minutes with Mod I. Patient requested to use the telephone and required Mod verbal and visual cues to read a phone number aloud appropriately. Patient left an appropriate voicemail and pulled a napkin out of his pocket when the SLP verbalized the need to wipe down the phone after each use (at RN station). Overall, patient appeared more appropriate and socially interactive this session. Patient handed off to OT. Continue with current plan of care.      Pain No/Denies Pain   Therapy/Group: Individual Therapy  Chrles Selley 09/14/2019, 1:03 PM

## 2019-09-14 NOTE — Progress Notes (Signed)
Patient ID: Jeremiah West, male   DOB: 1950/06/05, 69 y.o.   MRN: [id number]  SW received call from [facility] with regard to continued stay review. Pt is currently in appeals for SNF placement. Additional covered days until appeals decision is made. Pt covered 5/20-5/29 with d/c on 5/30. Fax dc summary when available #[phone].  Cecile Sheerer, MSW, LCSWA Office: [phone] Cell: [phone] Fax: [phone]

## 2019-09-14 NOTE — Progress Notes (Addendum)
Physical Therapy Session Note  Patient Details  Name: Jeremiah West MRN: [id number] Date of Birth: [birth year]  Today's Date: 09/14/2019 PT Individual Time: 0920-0950 AND 1350-1414 PT Individual Time Calculation (min): 30 min AND 24 min  Short Term Goals: Week 4:  PT Short Term Goal 1 (Week 4): =LTGs due to ELOS  Skilled Therapeutic Interventions/Progress Updates:   Session 1:  Pt intercepted attempting to get OOB, states he needs to toilet. Sit>stand w/ supervision. Ambulated to toilet w/ CGA using RW. Sit<>stand from toilet w/ CGA, supervision for LE garment management and pericare w/ verbal cues for safety. Verbal cues for toilet transfer technique including RW placement and use of grab bars. Ambulated out of bathroom and too chair w/ CGA, min assist needed to safely turn w/ RW to sit in chair, manual assist needed for RW placement. Pt agreeable to ambulate out of room w/ encouragement, he is perseverating on calling girlfriend but easily redirected. Ambulated to day room (125') w/ CGA-min assist. Max verbal and tactile cues to bring RW closer to him and for obstacle avoidance. Performed NuStep 10 min and @ level 3 w/ all extremities to work on endurance, global strengthening, activity tolerance, and reciprocal movement pattern. Min verbal and tactile cues to attend to task. Pt ambulated back to room w/ min assist using RW, same cues as detailed above. Pt requesting to stay in chair. Ended session in w/c at nurses station, all needs in reach.   Session 2:  Pt sitting alone EOB, attempting to use phone and bed alarm going off. Pt requesting assistance w/ dialing phone. Therapist provided max verbal and visual cues to correctly dial number he was trying to call, number also written down on paper in front of him. Pt more confused this afternoon. He was perseverating on calling his GF and waiting for his sister to pick him up. Therapist attempted to redirect him, stating that his doctor has not  cleared him to d/c home with sister yet. Pt continued to state, "no she said she was on her way to get me". Pt also trying to dial his own phone, but unable to understand that he couldn't dial himself. Therapist made multiple attempts at getting pt to either lay down or sit in w/c at nurses station, but refusing stating "I will sit right here", pt unsafe to sit EOB by himself. Pt w/ increasing agitation but w/ extra time to sit with therapist and talk about unrelated topics such as what he had for lunch, he was agreeable to lay down and watch TV. Supervision bed mobility. Pt resting in supine at end of session, all needs in reach.    Therapy Documentation Precautions:  Precautions Precautions: Fall Precaution Comments: fall risk. no longer in need of restraints Restrictions Weight Bearing Restrictions: No  Therapy/Group: Individual Therapy  Akyah Lagrange Melton Krebs 09/14/2019, 9:56 AM

## 2019-09-14 NOTE — Progress Notes (Signed)
Occupational Therapy Session Note  Patient Details  Name: [name] MRN: [id number] Date of Birth: [birth year]  Today's Date: 09/14/2019 OT Individual Time: 1245-1315 OT Individual Time Calculation (min): 30 min    Short Term Goals: Week 4:  OT Short Term Goal 1 (Week 4): STG=LTG d/t ELOS  Skilled Therapeutic Interventions/Progress Updates:    Pt passed off from SLP. Pt agreeable to shower. Pt completed functional mobility within room with heavy cueing for RW management/upright posture. Pt demonstrated good initiation throughout shower of appropriate sequencing through tasks. Pt required cueing for task progression. Pt completed all bathing in standing with CGA, good use of grab bar. Pt donned shirt with (S) and min A to don pants. Pt was left supine with all needs met, bed alarm set.   Therapy Documentation Precautions:  Precautions Precautions: Fall Precaution Comments: fall risk. no longer in need of restraints Restrictions Weight Bearing Restrictions: No   Therapy/Group: Individual Therapy  Curtis Sites 09/14/2019, 6:39 AM

## 2019-09-14 NOTE — Progress Notes (Signed)
PHYSICAL MEDICINE & REHABILITATION PROGRESS NOTE   Subjective/Complaints:  No new problems reported. Lying in bed just waking up when I came by this morning. Denies pain. Asks when he has therapy today.   ROS: Limited due to cognitive/behavioral   Objective:   No results found. Recent Labs    09/12/19 0655  WBC 3.0*  HGB 14.1  HCT 43.0  PLT 198   Recent Labs    09/12/19 0655  NA 140  K 4.1  CL 103  CO2 29  GLUCOSE 102*  BUN 15  CREATININE 1.18  CALCIUM 9.6    Intake/Output Summary (Last 24 hours) at 09/14/2019 0835 Last data filed at 09/14/2019 0831 Gross per 24 hour  Intake 1858 ml  Output 100 ml  Net 1758 ml     Physical Exam: Vital Signs Blood pressure 119/80, pulse 72, temperature (!) 97.5 F (36.4 C), temperature source Oral, resp. rate 16, height 5\' 7"  (1.702 m), weight 70.8 kg, SpO2 98 %.  Constitutional: No distress . Vital signs reviewed. Converses nicely. Trouble remembering time of therapy after informing multiple times HEENT: EOMI, oral membranes moist Neck: supple Cardiovascular: RRR without murmur. No JVD    Respiratory/Chest: CTA Bilaterally without wheezes or rales. Normal effort    GI/Abdomen: BS +, non-tender, non-distended Ext: no clubbing, cyanosis, or edema Psych: confused  Musc: No edema in extremities.  No tenderness in extremities. Neuro: alert, word finding deficits. Oriented to self Motor: moves all 4's. Normal sensation  Assessment/Plan: 1. Functional deficits secondary to TBI which require 3+ hours per day of interdisciplinary therapy in a comprehensive inpatient rehab setting.  Physiatrist is providing close team supervision and 24 hour management of active medical problems listed below.  Physiatrist and rehab team continue to assess barriers to discharge/monitor patient progress toward functional and medical goals  Care Tool:  Bathing  Bathing activity did not occur: Refused Body parts bathed by patient: Right  arm, Left arm, Chest, Abdomen, Front perineal area, Right upper leg, Left upper leg, Face, Right lower leg, Left lower leg   Body parts bathed by helper: Buttocks     Bathing assist Assist Level: Minimal Assistance - Patient > 75%     Upper Body Dressing/Undressing Upper body dressing   What is the patient wearing?: Pull over shirt    Upper body assist Assist Level: Supervision/Verbal cueing    Lower Body Dressing/Undressing Lower body dressing      What is the patient wearing?: Pants, Incontinence brief     Lower body assist Assist for lower body dressing: Minimal Assistance - Patient > 75%     Toileting Toileting    Toileting assist Assist for toileting: Moderate Assistance - Patient 50 - 74%(assit with wheelchair)     Transfers Chair/bed transfer  Transfers assist  Chair/bed transfer activity did not occur: Safety/medical concerns  Chair/bed transfer assist level: Contact Guard/Touching assist     Locomotion Ambulation   Ambulation assist   Ambulation activity did not occur: Refused(pt refused gait w/o AD)  Assist level: Minimal Assistance - Patient > 75% Assistive device: Other (comment)(UE over therapist's shoulder) Max distance: 50'   Walk 10 feet activity   Assist  Walk 10 feet activity did not occur: Refused  Assist level: Minimal Assistance - Patient > 75% Assistive device: Walker-rolling   Walk 50 feet activity   Assist Walk 50 feet with 2 turns activity did not occur: (did not perform turning w/gait)  Assist level: Minimal Assistance - Patient >  75% Assistive device: Walker-rolling    Walk 150 feet activity   Assist           Walk 10 feet on uneven surface  activity   Assist Walk 10 feet on uneven surfaces activity did not occur: Actor activity did not occur: N/A         Wheelchair 50 feet with 2 turns activity    Assist    Wheelchair 50 feet with 2  turns activity did not occur: N/A       Wheelchair 150 feet activity     Assist  Wheelchair 150 feet activity did not occur: N/A       Blood pressure 119/80, pulse 72, temperature (!) 97.5 F (36.4 C), temperature source Oral, resp. rate 16, height 5\' 7"  (1.702 m), weight 70.8 kg, SpO2 98 %.  Medical Problem List and Plan: 1.Functional and cognitive deficitssecondary to TBI with skull fracture  Continue CIR  -RLAS V, slow progress likely linked to significant ETOH abuse, aphasic also  SNF placement today  -f/u in the office to see me in 4-6 weeks 2. Antithrombotics: -DVT/anticoagulation:Pharmaceutical:Lovenox -antiplatelet therapy: N/a 3. Pain Management:Ultram and/or oxycodone prn.    -continue scheduled tylenol qid for now.well controlled 4. Mood:LCSW to follow for evaluation and support when appropriate. -antipsychotic agents: seroquel 5. Neuropsych: This patientis notcapable of making decisions on hisown behalf.  Regular bed now   RLAS V   -5/21 continue on seroquel in late afternoon (1600) and at HS to help with sleep and PM agitation  5/24--continue same regimen 6. Skin/Wound Care:routine pressure relief measures. 7. Fluids/Electrolytes/Nutrition:good appetite  -f/u labs all reviewed 8. Agitation: At risk for falls.    - environmental mods  -continue propranolol when willing to use  -seroquel  Telesitter renewed 9. Hyperglycemia:  Resolved off tf  10. History of polysubstance abuse: Continue thiamine and folic acid.  11. Dysphagia: D2 thins for now  Eating well.   -recent labs acceptable   14.  Hyponatremia  Sodium 133 5/17---> 140 today 5/24   LOS: 27 days A FACE TO FACE EVALUATION WAS PERFORMED  6/24 P Nyima Vanacker 09/14/2019, 8:35 AM

## 2019-09-14 NOTE — Progress Notes (Signed)
Patient ID: Jeremiah West, male   DOB: [birth year], 69 y.o.   MRN: [id number] Spoke with patient's sister, [name], to give update on insurance appeals process. Explained that insurance had requested a peer to peer to support the need for SNF placement. Explained that it was still in the appeals process. If patient is not approved for SNF placement, he is approved to stay on CIR until 09/17/19 with a discharge date of 09/18/19. Ask the sister if she could come in for family education teaching and Friday, 09/17/19 from 0930-1130 was agreed upon. Cecile Sheerer, LCSW, to make therapy aware. Explained to the sister that she could go to [facility] and speak with his caseworker and apply for special assistance for an assisted living facility. He would need a memory care unit because of his TBI. I explained I would let her know when I heard from the insurance.   Kennyth Arnold, RN, BSN, [place], [place], [place] Office: [phone] Cell: [phone]

## 2019-09-15 ENCOUNTER — Inpatient Hospital Stay (HOSPITAL_COMMUNITY): Payer: Medicare Other

## 2019-09-15 ENCOUNTER — Inpatient Hospital Stay (HOSPITAL_COMMUNITY): Payer: Medicare Other | Admitting: Speech Pathology

## 2019-09-15 NOTE — Progress Notes (Signed)
Speech Language Pathology Daily Session Note  Patient Details  Name: Jeremiah West MRN: [id number] Date of Birth: 12-22-1950  Today's Date: 09/15/2019 SLP Individual Time: 1430-1440 SLP Individual Time Calculation (min): 10 min  Short Term Goals: Week 4: SLP Short Term Goal 1 (Week 4): STGs=LTGs due to ELOS  Skilled Therapeutic Interventions: Skilled treatment session focused on cognitive goals. Upon arrival, patient was awake in bed. Patient declined out of bed activity despite max encouragement. Patient also declined a snack of upgraded textures. Patient unable to recall that his sister and girlfriend came to visit today and was perseverative on not being able to leave the hospital yet. Patient read orientation signs aloud but then reported, "that's not me." Patient left upright in bed with alarm on and all needs within reach. Continue with current plan of care.      Pain No/Denies Pain   Therapy/Group: Individual Therapy  Tiffinie Caillier 09/15/2019, 3:08 PM

## 2019-09-15 NOTE — Progress Notes (Signed)
Patient ID: Jeremiah West, male   DOB: [birth year], 69 y.o.   MRN: [id number]  DME order: w/c and RW sent to [employer] via parachute.  SW will need sister's address to arrange [facility] services and provide [facility] list, as well as initiate [facility] referral.   Cecile Sheerer, MSW, LCSWA Office: [phone] Cell: [phone] Fax: [phone]

## 2019-09-15 NOTE — Progress Notes (Signed)
Occupational Therapy Note  Patient Details  Name: Jeremiah West MRN: [id number] Date of Birth: June 01, 1950  Today's Date: 09/15/2019 OT Missed Time: 30 Minutes Missed Time Reason: Patient unwilling/refused to participate without medical reason  Pt received in bed reporting just showered and just went to therapy. Despite reorientation pt refuses all tx stating, "I just want to sleep." Exited with pt missing 30 min skilled OT d/t refusal. Attempted to engage pt in tx later in afternoon but pt continues to refuse  Shon Hale 09/15/2019, 3:47 PM

## 2019-09-15 NOTE — Progress Notes (Signed)
[place] PHYSICAL MEDICINE & REHABILITATION PROGRESS NOTE   Subjective/Complaints:  Up in bathroom using the toilet. No new issues overnight  ROS: Patient denies fever, rash, sore throat, blurred vision, nausea, vomiting, diarrhea, cough, shortness of breath or chest pain, joint or back pain, headache, or mood change.    Objective:   No results found. No results for input(s): WBC, HGB, HCT, PLT in the last 72 hours. No results for input(s): NA, K, CL, CO2, GLUCOSE, BUN, CREATININE, CALCIUM in the last 72 hours.  Intake/Output Summary (Last 24 hours) at 09/15/2019 1046 Last data filed at 09/15/2019 0900 Gross per 24 hour  Intake 700 ml  Output --  Net 700 ml     Physical Exam: Vital Signs Blood pressure (!) 103/56, pulse 74, temperature 98 F (36.7 C), resp. rate 16, height 5\' 7"  (1.702 m), weight 71.3 kg, SpO2 100 %.  Constitutional: No distress . Vital signs reviewed. HEENT: EOMI, oral membranes moist Neck: supple Cardiovascular: RRR without murmur. No JVD    Respiratory/Chest: CTA Bilaterally without wheezes or rales. Normal effort    GI/Abdomen: BS +, non-tender, non-distended Ext: no clubbing, cyanosis, or edema Psych: confused, follows basic commands. Delayed processing.  Musc: No edema in extremities.  No tenderness in extremities. Neuro: alert, word finding deficits. Oriented to self. aphasic Motor: moves all 4's. Normal sensation  Assessment/Plan: 1. Functional deficits secondary to TBI which require 3+ hours per day of interdisciplinary therapy in a comprehensive inpatient rehab setting.  Physiatrist is providing close team supervision and 24 hour management of active medical problems listed below.  Physiatrist and rehab team continue to assess barriers to discharge/monitor patient progress toward functional and medical goals  Care Tool:  Bathing  Bathing activity did not occur: Refused Body parts bathed by patient: Right arm, Left arm, Chest, Abdomen,  Front perineal area, Right upper leg, Left upper leg, Face, Right lower leg, Left lower leg   Body parts bathed by helper: Buttocks     Bathing assist Assist Level: Minimal Assistance - Patient > 75%     Upper Body Dressing/Undressing Upper body dressing   What is the patient wearing?: Pull over shirt    Upper body assist Assist Level: Supervision/Verbal cueing    Lower Body Dressing/Undressing Lower body dressing      What is the patient wearing?: Pants, Incontinence brief     Lower body assist Assist for lower body dressing: Minimal Assistance - Patient > 75%     Toileting Toileting    Toileting assist Assist for toileting: Moderate Assistance - Patient 50 - 74%(assit with wheelchair)     Transfers Chair/bed transfer  Transfers assist  Chair/bed transfer activity did not occur: Safety/medical concerns  Chair/bed transfer assist level: Contact Guard/Touching assist     Locomotion Ambulation   Ambulation assist   Ambulation activity did not occur: Refused(pt refused gait w/o AD)  Assist level: Minimal Assistance - Patient > 75% Assistive device: Walker-rolling Max distance: 125'   Walk 10 feet activity   Assist  Walk 10 feet activity did not occur: Refused  Assist level: Minimal Assistance - Patient > 75% Assistive device: Walker-rolling   Walk 50 feet activity   Assist Walk 50 feet with 2 turns activity did not occur: (did not perform turning w/gait)  Assist level: Minimal Assistance - Patient > 75% Assistive device: Walker-rolling    Walk 150 feet activity   Assist           Walk 10 feet on uneven  surface  activity   Assist Walk 10 feet on uneven surfaces activity did not occur: Armed forces operational officer activity did not occur: N/A         Wheelchair 50 feet with 2 turns activity    Assist    Wheelchair 50 feet with 2 turns activity did not occur: N/A       Wheelchair 150 feet  activity     Assist  Wheelchair 150 feet activity did not occur: N/A       Blood pressure (!) 103/56, pulse 74, temperature 98 F (36.7 C), resp. rate 16, height 5\' 7"  (1.702 m), weight 71.3 kg, SpO2 100 %.  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  -f/u in the office to see me in 4-6 weeks after discharge 2. Antithrombotics: -DVT/anticoagulation:Pharmaceutical:Lovenox -antiplatelet therapy: N/a 3. Pain Management:Ultram and/or oxycodone prn.    -continue scheduled tylenol qid for now.well controlled 4. Mood:LCSW to follow for evaluation and support when appropriate. -antipsychotic agents: seroquel 5. Neuropsych: This patientis notcapable of making decisions on hisown behalf.  Regular bed now   RLAS V   -5/21 continue on seroquel in late afternoon (1600) and at HS to help with sleep and PM agitation  5/27--continue same regimen 6. Skin/Wound Care:routine pressure relief measures. 7. Fluids/Electrolytes/Nutrition:good appetite  -f/u labs all reviewed 8. Agitation: At risk for falls.    - environmental mods  -continue propranolol when willing to use  -seroquel  Telesitter renewed 9. Hyperglycemia:  Resolved off tf  10. History of polysubstance abuse: Continue thiamine and folic acid.  11. Dysphagia: D2 thins for now  Eating well.   -recent labs acceptable   14.  Hyponatremia  Sodium 133 5/17---> 140  5/24   LOS: 28 days A FACE TO FACE EVALUATION WAS PERFORMED  Meredith Staggers 09/15/2019, 10:46 AM

## 2019-09-15 NOTE — Plan of Care (Signed)
  Problem: RH BLADDER ELIMINATION Goal: RH STG MANAGE BLADDER WITH ASSISTANCE Description: STG Manage Bladder With min Assistance Outcome: Progressing   Problem: RH SAFETY Goal: RH STG ADHERE TO SAFETY PRECAUTIONS W/ASSISTANCE/DEVICE Description: STG Adhere to Safety Precautions With min/mod Assistance/Device. Outcome: Not Progressing; telesitter   Problem: RH KNOWLEDGE DEFICIT BRAIN INJURY Goal: RH STG INCREASE KNOWLEDGE OF SELF CARE AFTER BRAIN INJURY Description: With min/mod assist Outcome: Not Progressing

## 2019-09-15 NOTE — Progress Notes (Signed)
Physical Therapy Session Note  Patient Details  Name: Jeremiah West MRN: [id number] Date of Birth: 01-20-1951  Today's Date: 09/15/2019 PT Individual Time: 1230-1300 PT Individual Time Calculation (min): 30 min   Short Term Goals: Week 1:  PT Short Term Goal 1 (Week 1): Pt will tolerate 60 min of upright activity w/ minimal increase in fatigue PT Short Term Goal 1 - Progress (Week 1): Met PT Short Term Goal 2 (Week 1): Pt will initiate stair training PT Short Term Goal 2 - Progress (Week 1): Progressing toward goal PT Short Term Goal 3 (Week 1): Pt will ambulate 25' w/ mod assist w/ LRAD PT Short Term Goal 3 - Progress (Week 1): Progressing toward goal PT Short Term Goal 4 (Week 1): Pt will initiate functional tasks w/ min cues 50% of the time PT Short Term Goal 4 - Progress (Week 1): Met Week 2:  PT Short Term Goal 1 (Week 2): Pt will initiate stair training PT Short Term Goal 1 - Progress (Week 2): Not progressing PT Short Term Goal 2 (Week 2): Pt will ambulate 56' w/ mod assist consistently using LRAD PT Short Term Goal 2 - Progress (Week 2): Met PT Short Term Goal 3 (Week 2): Pt will initiate functional tasks w/ only min cues 100% of the time PT Short Term Goal 3 - Progress (Week 2): Progressing toward goal PT Short Term Goal 4 (Week 2): Pt will perform bed<>chair transfer w/ CGA PT Short Term Goal 4 - Progress (Week 2): Progressing toward goal Week 3:  PT Short Term Goal 1 (Week 3): Pt will initiate functional tasks w/ min cues 100% of the time PT Short Term Goal 1 - Progress (Week 3): Met PT Short Term Goal 2 (Week 3): Pt will ambulate 64' w/ min assist w/ LRAD consistently PT Short Term Goal 2 - Progress (Week 3): Progressing toward goal PT Short Term Goal 3 (Week 3): Pt will maintain selective attention to functional tasks in controlled environment w/ min assist PT Short Term Goal 3 - Progress (Week 3): Met  Skilled Therapeutic Interventions/Progress Updates:     PAIN Denies pain Pt initially supine and states he doesn't want to have "therapy", but agreeable to gait.  Supine to sit to stand w/cga, bed to wc SPT w/cga. Pt transported to gym for session.   STS w/cga and gait 47f w/RW w/cga abd cues due to forward lean and tendency not to maintain safe proximity to walker.  Despite cues, pt maintains increased distance.  Pt stops at stairs, min assist to manage walker and transition to rails.   Pt ascends/descends 4 5" and 8 3" steps w/bilat handrails and cga, mild balance loss when turning at base of stairs requiring min assist to recover. Pt transitions back to walker w/min assist and gait 675fas above back to wc, turn/sit w/cues for safety, cga.  Pt requested use of BR.  Transported to bathroom.  STS from wc w/CGA, CGA for balance/continent of urine and manages pants cga only.  Pt washes/dries hands at sink from wc level with min assist/cues to use/locate soap dispenser. Pt transported back to gym for continued session.  STS w/cga and attempted ball toss to/from rebounder but unable to coordinate task. Returned to sitting and performed ball toss x 10 reps from wc.  Pt then returned to standing w/CGA and was able to perform standing ball toss x 30 reps w/cga.  Pt transported back to room.  Gt 1067f/rw w/max cues for  safety w/turn/sit to bed w/RW, cga for balance.  Pt then turns to face bed and climbs into bed w/cga, heavy cuing due to poor safety awareness. Pt left supine w/rails up x 4, alarm set, bed in lowest position, and needs in reach.   Therapy Documentation Precautions:  Precautions Precautions: Fall Precaution Comments: fall risk. no longer in need of restraints Restrictions Weight Bearing Restrictions: No    Therapy/Group: Individual Therapy  Callie Fielding, [place] 09/15/2019, 2:59 PM

## 2019-09-15 NOTE — Progress Notes (Signed)
Patient ID: Jeremiah West, male   DOB: 10/12/1950, 69 y.o.   MRN: [id number] Spoke with [name], the patient's sister this afternoon, to give her an update on discharge status. Informed her that we would order an 18x18 WC and with his insurance (Medicare), she may still have to pay a balance. He would also need a rolling walker but that would have to be purchased out-of-pocket. We would also set the patient up with a primary care physician, which he doesn't currently have. We still await to hear from Insurance on the appeal for his SNF placement.  I mentioned to Ms. Maisie Fus that I would need her address or zip code to set up Home Health Services. We are asking for PT, OT, ST, CNA, and for SW. This would go through his Medicaid. I also mentioned that we are going to be sending a referral to [employer] [employer] to ask for aide services. This would go through his Medicaid and as long as he full coverage he would receive 60 hrs of aide services. Once the nurse comes out and does the initial assessment, which could take up to 30 days or more for services to initiate and, this 60 hrs could increase or decrease. Medicaid will inform us of his benefit level.   I explained to her that I would provide her with a list of Home Health Agencies within her area, which is why I needed her address or zip code. I told her I would give this to her tomorrow when she comes in for family education starting at 0930. She told me that she, "could come tomorrow, could we schedule for Saturday instead?" I told her I would check into it and let her know. She also stated, "I can't take him to my home because I'm a foster parent." Will continue to follow up.  Kennyth Arnold, RN, BSN, Marissa Calamity, [facility] 4W/M Inpatient Rehabilitation Office [phone] Cell [phone]

## 2019-09-16 ENCOUNTER — Encounter (HOSPITAL_COMMUNITY): Payer: Medicare Other

## 2019-09-16 ENCOUNTER — Encounter (HOSPITAL_COMMUNITY): Payer: Medicare Other | Admitting: Speech Pathology

## 2019-09-16 ENCOUNTER — Ambulatory Visit (HOSPITAL_COMMUNITY): Payer: Medicare Other | Admitting: Physical Therapy

## 2019-09-16 NOTE — Progress Notes (Signed)
Chaplain called to speak with nurse but nurse was unavailable at this time.  Chaplain wanted to discuss Advanced Directive.  Currently there are no notaries available in the spiritual care department to complete paperwork.  Chaplain will have to follow-up this weekend or on Monday for completion.

## 2019-09-16 NOTE — Progress Notes (Signed)
Patient ID: Jeremiah West, male   DOB: 07-26-1950, 69 y.o.   MRN: [id number]  SW faxed PCS referral to [facility] [phone]).  *Received confirmation referral received, and SW waiting on follow-up to complete phone assessment.  SW followed up with [employer] about DME delivery: w/c and RW to be delivered tomorrow. Per CM, no follow-up from caregiver to provide an address. [facility] willing to accept pt pending address: [name] [phone]) will accept pt for HHPT/OT/ST/SW. SW left PCS provider list in room for sister to select 3 locations, and she has been encouraged to follow-up with her case manager.   D/c instructions updated to reflect above.   Cecile Sheerer, MSW, LCSWA Office: [phone] Cell: [phone] Fax: [phone]

## 2019-09-16 NOTE — Progress Notes (Signed)
Speech Language Pathology Weekly Progress and Session Note  Patient Details  Name: Jeremiah West MRN: [id number] Date of Birth: 03-Oct-1950  Beginning of progress report period: Sep 09, 2019 End of progress report period: Sep 16, 2019  Today's Date: 09/16/2019 SLP Individual Time: 1100-1145 SLP Individual Time Calculation (min): 45 min  Short Term Goals: Week 4: SLP Short Term Goal 1 (Week 4): STGs=LTGs due to ELOS    New Short Term Goals: Week 5: SLP Short Term Goal 1 (Week 5): STGs=LTGs due to ELOS  Weekly Progress Updates: Patient demonstrates behaviors consistent with a Rancho Level V-VI and requires total A multimodal cues for safety awareness as patient continues to get out of bed without assistance and for intellectual awareness of deficits. Min-Mod A verbal cues are needed for basic problem solving and attention with functional and familiar tasks. Patient is consuming Dys. 2 textures with thin liquids with minimal overt s/s of aspiration and Min verbal cues for use of swallowing compensatory strategies.  Patient's discharge plan is currently pending and we working on completing family education.  Intensity: Minumum of 1-2 x/day, 30 to 90 minutes Frequency: 3 to 5 out of 7 days Duration/Length of Stay: TBD due SNF placement Treatment/Interventions: Cognitive remediation/compensation;Internal/external aids;Dysphagia/aspiration precaution training;Speech/Language facilitation;Therapeutic Activities;Environmental controls;Cueing hierarchy;Functional tasks;Patient/family education   Daily Session  Skilled Therapeutic Interventions: Skilled treatment session focused on cognitive-linguistic and dysphagia goals. SLP facilitated session by providing overall Mod A verbal cues for problem solving during a basic calendar making task. Patient also named functional items with 100% accuracy and supervision level verbal cues and vebralied the function of the items with overall Mod verbal cues.  Patient demonstrated sustained to all tasks for ~15 minutes with supervision verbal cues. SLP also facilitated session with trials of dys. 3 textures. Patient demonstrated prolonged mastication with complete oral clearance without overt s/s of aspiration. Recommend ongoing trials and/or trial tray prior to upgrade. Patient left upright at RN station with alarm on. Continue with current plan of care.      Pain No/Denies Pain   Therapy/Group: Individual Therapy  Chaysen Tillman 09/16/2019, 12:43 PM

## 2019-09-16 NOTE — Progress Notes (Signed)
[place] PHYSICAL MEDICINE & REHABILITATION PROGRESS NOTE   Subjective/Complaints: Mr. Main has no complaints.  Spoke with Auria about discharge plan. Still no SNF available. Patient's sister is to attend caregiver training tomorrow.   ROS: Patient denies fever, rash, sore throat, blurred vision, nausea, vomiting, diarrhea, cough, shortness of breath or chest pain, joint or back pain, headache, or mood change.   Objective:   No results found. No results for input(s): WBC, HGB, HCT, PLT in the last 72 hours. No results for input(s): NA, K, CL, CO2, GLUCOSE, BUN, CREATININE, CALCIUM in the last 72 hours.  Intake/Output Summary (Last 24 hours) at 09/16/2019 1016 Last data filed at 09/16/2019 0903 Gross per 24 hour  Intake 480 ml  Output --  Net 480 ml     Physical Exam: Vital Signs Blood pressure 108/77, pulse 78, temperature 98.4 F (36.9 C), temperature source Oral, resp. rate 18, height 5\' 7"  (1.702 m), weight 72 kg, SpO2 95 %.  Constitutional: No distress . Vital signs reviewed. Sitting up in chair comfortably.  HEENT: EOMI, oral membranes moist Neck: supple Cardiovascular: RRR without murmur. No JVD    Respiratory/Chest: CTA Bilaterally without wheezes or rales. Normal effort    GI/Abdomen: BS +, non-tender, non-distended Ext: no clubbing, cyanosis, or edema Psych: confused, follows basic commands. Delayed processing.  Musc: No edema in extremities.  No tenderness in extremities. Neuro: alert, word finding deficits. Oriented to self. aphasic Motor: moves all 4's. Normal sensation  Assessment/Plan: 1. Functional deficits secondary to TBI which require 3+ hours per day of interdisciplinary therapy in a comprehensive inpatient rehab setting.  Physiatrist is providing close team supervision and 24 hour management of active medical problems listed below.  Physiatrist and rehab team continue to assess barriers to discharge/monitor patient progress toward functional and  medical goals  Care Tool:  Bathing  Bathing activity did not occur: Refused Body parts bathed by patient: Right arm, Left arm, Chest, Abdomen, Front perineal area, Right upper leg, Left upper leg, Face, Right lower leg, Left lower leg, Buttocks   Body parts bathed by helper: Buttocks     Bathing assist Assist Level: Supervision/Verbal cueing     Upper Body Dressing/Undressing Upper body dressing   What is the patient wearing?: Pull over shirt    Upper body assist Assist Level: Supervision/Verbal cueing    Lower Body Dressing/Undressing Lower body dressing      What is the patient wearing?: Pants, Incontinence brief     Lower body assist Assist for lower body dressing: Supervision/Verbal cueing     Toileting Toileting    Toileting assist Assist for toileting: Moderate Assistance - Patient 50 - 74%(assit with wheelchair)     Transfers Chair/bed transfer  Transfers assist  Chair/bed transfer activity did not occur: Safety/medical concerns  Chair/bed transfer assist level: Contact Guard/Touching assist     Locomotion Ambulation   Ambulation assist   Ambulation activity did not occur: Refused(pt refused gait w/o AD)  Assist level: Contact Guard/Touching assist Assistive device: Walker-rolling Max distance: 120   Walk 10 feet activity   Assist  Walk 10 feet activity did not occur: Refused  Assist level: Contact Guard/Touching assist Assistive device: Walker-rolling   Walk 50 feet activity   Assist Walk 50 feet with 2 turns activity did not occur: (did not perform turning w/gait)  Assist level: Minimal Assistance - Patient > 75% Assistive device: Walker-rolling    Walk 150 feet activity   Assist  Walk 10 feet on uneven surface  activity   Assist Walk 10 feet on uneven surfaces activity did not occur: Armed forces operational officer activity did not occur: N/A         Wheelchair 50 feet  with 2 turns activity    Assist    Wheelchair 50 feet with 2 turns activity did not occur: N/A       Wheelchair 150 feet activity     Assist  Wheelchair 150 feet activity did not occur: N/A       Blood pressure 108/77, pulse 78, temperature 98.4 F (36.9 C), temperature source Oral, resp. rate 18, height 5\' 7"  (1.702 m), weight 72 kg, SpO2 95 %.  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  -f/u in the office to see me in 4-6 weeks after discharge 2. Antithrombotics: -DVT/anticoagulation:Pharmaceutical:Lovenox -antiplatelet therapy: N/a 3. Pain Management:Ultram and/or oxycodone prn.    -continue scheduled tylenol qid for now.well controlled. 4. Mood:LCSW to follow for evaluation and support when appropriate. -antipsychotic agents: seroquel 5. Neuropsych: This patientis notcapable of making decisions on hisown behalf.  Regular bed now   RLAS V   -5/21 continue on seroquel in late afternoon (1600) and at HS to help with sleep and PM agitation  5/27--continue same regimen 6. Skin/Wound Care:routine pressure relief measures. 7. Fluids/Electrolytes/Nutrition:good appetite  -f/u labs all reviewed 8. Agitation: At risk for falls.    - environmental mods  -continue propranolol when willing to use  -seroquel  Telesitter renewed 9. Hyperglycemia:  Resolved off tf  10. History of polysubstance abuse: Continue thiamine and folic acid.  11. Dysphagia: D2 thins for now  Eating well.   -recent labs acceptable   14.  Hyponatremia  Sodium 133 5/17---> 140  5/24   LOS: 29 days A FACE TO FACE EVALUATION WAS PERFORMED  Toshia Larkin P Rainey Rodger 09/16/2019, 10:16 AM

## 2019-09-16 NOTE — Progress Notes (Signed)
Physical Therapy Session Note  Patient Details  Name: Jeremiah West MRN: [id number] Date of Birth: [birth year]  Today's Date: 09/16/2019 PT Individual Time: 1015-1100 PT Individual Time Calculation (min): 45 min   Short Term Goals: Week 4:  PT Short Term Goal 1 (Week 4): =LTGs due to ELOS  Skilled Therapeutic Interventions/Progress Updates:   Pt received in w/c at nurses station, agreeable to therapy and no c/o pain. Pt requesting to call his GF. Therapist dialed number and pt spoke with her very briefly. When asked about conversation, pt unable to recall what she said. Pt requesting to toilet. Returned to room total assist and ambulated to/from toilet w/ RW and performed toilet transfer w/ CGA for balance. Pt performed pericare and LE garment management w/ supervision, he was continent of bowel and bladder. Ambulated to sink and stood to wash hands w/ supervision. Ambulated out into hallway and to therapy gym w/ CGA, 100'. Frequent verbal and tactile cues to bring RW closer to him and for upright posture. Pt w/ safe and appropriate RW management ~10% of the time w/ all gait today. Worked on standing tolerance while standing to toss horseshoes. Stood in multiple 2-3 min bouts w/ CGA to close supervision to toss, pt attended to task well. Ambulated 150' to day room w/ CGA, verbal cues to slow speed and for safety. Performed NuStep 10 min @ level 3 w/ all extremities to work on endurance and global strengthening, min verbal cues to attend to task. Ended session in w/c and handoff to SLP.   Overall, pt pleasant and cooperative today and did not perseverate on going home or leaving hospital. He remained engaged in all tasks w/ min cues and would appropriately ask "what's next?" at the end of each task.   Therapy Documentation Precautions:  Precautions Precautions: Fall Precaution Comments: fall risk. no longer in need of restraints Restrictions Weight Bearing Restrictions: No Vital  Signs: Therapy Vitals Pulse Rate: 78 BP: 108/77  Therapy/Group: Individual Therapy  Florestine Carmical K Ameia Morency 09/16/2019, 11:02 AM

## 2019-09-16 NOTE — Progress Notes (Signed)
Occupational Therapy Session Note  Patient Details  Name: Jeremiah West MRN: [id number] Date of Birth: Apr 22, 1950  Today's Date: 09/16/2019 OT Individual Time: 0932-1010 OT Individual Time Calculation (min): 38 min    Short Term Goals: Week 1:  OT Short Term Goal 1 (Week 1): Pt will consistently transfer to toilet wiht MIN A OT Short Term Goal 1 - Progress (Week 1): Met OT Short Term Goal 2 (Week 1): Pt will thread BLE into pants demoing improved dressing praxis OT Short Term Goal 2 - Progress (Week 1): Met OT Short Term Goal 3 (Week 1): Pt will stand to groom with MIN A and MOD VC for sequncing OT Short Term Goal 3 - Progress (Week 1): Met OT Short Term Goal 4 (Week 1): Pt will bathe UB with MOD VC for initation OT Short Term Goal 4 - Progress (Week 1): Met  Skilled Therapeutic Interventions/Progress Updates:    1:1. Pt received in bed agreeable to OT. Pt wanting to shower with girlfriend, but amenable to OT assisting with shower, "to follow the rules." Pt completes transfer into/out of shower with RW with A to steer walker but CGA. Pt completes bathing with supervision to apply soap to wash cloth and rinse with water not soapy wash cloth. Pt completes dressing in w/c at sink with supervison for UB and LB dressing. Pt requires VC for orientation of clothing only. Exited session with p tseated at RN station, exit alarm on awaiting PT.   Therapy Documentation Precautions:  Precautions Precautions: Fall Precaution Comments: fall risk. no longer in need of restraints Restrictions Weight Bearing Restrictions: No General:   Vital Signs: Therapy Vitals Pulse Rate: 78 BP: 108/77 Pain:   ADL: ADL Grooming: Supervision/safety Where Assessed-Grooming: Sitting at sink Upper Body Bathing: Moderate assistance, Supervision/safety Where Assessed-Upper Body Bathing: Sitting at sink Lower Body Bathing: Moderate assistance, Contact guard Where Assessed-Lower Body Bathing: Sitting at  sink Upper Body Dressing: Supervision/safety Where Assessed-Upper Body Dressing: Sitting at sink, Standing at sink Lower Body Dressing: Contact guard Where Assessed-Lower Body Dressing: Sitting at sink, Standing at sink Toileting: Contact guard Where Assessed-Toileting: Glass blower/designer: Therapist, music Method: Arts development officer: Systems analyst    Praxis   Exercises:   Other Treatments:     Therapy/Group: Individual Therapy  Tonny Branch 09/16/2019, 10:10 AM

## 2019-09-17 ENCOUNTER — Encounter (HOSPITAL_COMMUNITY): Payer: Medicare Other

## 2019-09-17 ENCOUNTER — Ambulatory Visit (HOSPITAL_COMMUNITY): Payer: Medicare Other | Admitting: Physical Therapy

## 2019-09-17 DIAGNOSIS — S069X0S Unspecified intracranial injury without loss of consciousness, sequela: Secondary | ICD-10-CM

## 2019-09-17 NOTE — Plan of Care (Signed)
  Problem: RH BOWEL ELIMINATION Goal: RH STG MANAGE BOWEL WITH ASSISTANCE Description: STG Manage Bowel with min Assistance. Outcome: Progressing   Problem: RH BLADDER ELIMINATION Goal: RH STG MANAGE BLADDER WITH ASSISTANCE Description: STG Manage Bladder With min Assistance Outcome: Progressing   Problem: RH SAFETY Goal: RH STG ADHERE TO SAFETY PRECAUTIONS W/ASSISTANCE/DEVICE Description: STG Adhere to Safety Precautions With min/mod Assistance/Device. Outcome: Progressing   Problem: RH COGNITION-NURSING Goal: RH STG USES MEMORY AIDS/STRATEGIES W/ASSIST TO PROBLEM SOLVE Description: STG Uses Memory Aids/Strategies With min/mod Assistance to Problem Solve. Outcome: Progressing Goal: RH STG ANTICIPATES NEEDS/CALLS FOR ASSIST W/ASSIST/CUES Description: STG Anticipates Needs/Calls for Assist With min/mod Assistance/Cues. Outcome: Progressing   Problem: RH KNOWLEDGE DEFICIT BRAIN INJURY Goal: RH STG INCREASE KNOWLEDGE OF SELF CARE AFTER BRAIN INJURY Description: With min/mod assist Outcome: Progressing Goal: RH STG INCREASE KNOWLEDGE OF DYSPHAGIA/FLUID INTAKE Description: With min/mod assist Outcome: Progressing

## 2019-09-17 NOTE — Progress Notes (Signed)
Physical Therapy Session Note  Patient Details  Name: Jeremiah West MRN: [id number] Date of Birth: January 11, 1951  Today's Date: 09/17/2019 PT Individual Time: 7001-7494 PT Individual Time Calculation (min): 39 min   Short Term Goals: Week 4:  PT Short Term Goal 1 (Week 4): =LTGs due to ELOS  Skilled Therapeutic Interventions/Progress Updates:   Pt received in w/c, sister Jeremiah West) present for family education. OT already educated her on transfers and ADLs. Pt self-propelled w/c x75' w/ supervision using BUEs. Discussed that he is safe to ambulate short household distances using RW and CGA. Pt demonstrated x100'. Therapist educated sister on cues pt needs for upright posture and safe RW management. Discussed home set-up of sister's house, which she has unilateral rail w/ 2 steps to access 1-level home. Pt negotiated x4 steps w/ unilateral (R) rail w/ CGA and verbal cues for technique. Also educated sister on verbal cues for safe stair negotiation including UE placement and step-to pattern. Discussed car transfer technique to sister's small SUV. Verbally educated on turning to sit for stand pivot vs typical way of getting in/out of car. Pt's sister verbalized understanding of this and all other education, she states that is how she performs a car transfer. Therapist encouraged pt's sister to participate in hands-on training, however she declined stating she was just present to watch. She declined walking to other side of gym to observe pt on stairs, rather observing from over 50' away. She does appear to be making appropriate home set-up modifications including installation of rail on outside steps and she plans to have a ramp installed in the future. However she then stated "my house is just not set-up for him" and she states she is appealing insurance for him to receive SNF coverage. She expressed that she hopes he will be "back to normal" 6-12 months from now. Provided education on brain injury recovery  and high likelihood that he will never be 100% the same functionally and cognitively. Returned to room total assist as pt had not eaten breakfast yet. Provided supervision to eat ~50% of breakfast w/ max verbal cues to slow down and to swallow before taking another bite.   Therapy Documentation Precautions:  Precautions Precautions: Fall Precaution Comments: fall risk. no longer in need of restraints Restrictions Weight Bearing Restrictions: No  Therapy/Group: Individual Therapy  [name] 09/17/2019, 11:16 AM

## 2019-09-17 NOTE — Progress Notes (Signed)
[facility] PHYSICAL MEDICINE & REHABILITATION PROGRESS NOTE   Subjective/Complaints: Mr. Decicco has no complaints.  Sister was here this am but pt does not recall until reminded  ROS: Patient denies CP, SOB, N/V/D  Objective:   No results found. No results for input(s): WBC, HGB, HCT, PLT in the last 72 hours. No results for input(s): NA, K, CL, CO2, GLUCOSE, BUN, CREATININE, CALCIUM in the last 72 hours.  Intake/Output Summary (Last 24 hours) at 09/17/2019 1214 Last data filed at 09/17/2019 0845 Gross per 24 hour  Intake 600 ml  Output --  Net 600 ml     Physical Exam: Vital Signs Blood pressure (!) 112/57, pulse 62, temperature 98 F (36.7 C), resp. rate 18, height 5\' 7"  (1.702 m), weight 71.9 kg, SpO2 98 %.    General: No acute distress Mood and affect are appropriate Heart: Regular rate and rhythm no rubs murmurs or extra sounds Lungs: Clear to auscultation, breathing unlabored, no rales or wheezes Abdomen: Positive bowel sounds, soft nontender to palpation, nondistended Extremities: No clubbing, cyanosis, or edema Skin: No evidence of breakdown, no evidence of rash Neurologic: oriented to person and "rehab" but not hospital , no agitation , moves all ext  Musculoskeletal: Full range of motion in all 4 extremities. No joint swelling   Assessment/Plan: 1. Functional deficits secondary to TBI which require 3+ hours per day of interdisciplinary therapy in a comprehensive inpatient rehab setting.  Physiatrist is providing close team supervision and 24 hour management of active medical problems listed below.  Physiatrist and rehab team continue to assess barriers to discharge/monitor patient progress toward functional and medical goals  Care Tool:  Bathing  Bathing activity did not occur: Refused Body parts bathed by patient: Right arm, Left arm, Chest, Abdomen, Front perineal area, Right upper leg, Left upper leg, Face, Right lower leg, Left lower leg, Buttocks    Body parts bathed by helper: Buttocks     Bathing assist Assist Level: Supervision/Verbal cueing     Upper Body Dressing/Undressing Upper body dressing   What is the patient wearing?: Pull over shirt    Upper body assist Assist Level: Supervision/Verbal cueing    Lower Body Dressing/Undressing Lower body dressing      What is the patient wearing?: Pants, Incontinence brief     Lower body assist Assist for lower body dressing: Supervision/Verbal cueing     Toileting Toileting    Toileting assist Assist for toileting: Moderate Assistance - Patient 50 - 74%(assit with wheelchair)     Transfers Chair/bed transfer  Transfers assist  Chair/bed transfer activity did not occur: Safety/medical concerns  Chair/bed transfer assist level: Contact Guard/Touching assist     Locomotion Ambulation   Ambulation assist   Ambulation activity did not occur: Refused(pt refused gait w/o AD)  Assist level: Contact Guard/Touching assist Assistive device: Walker-rolling Max distance: 100'   Walk 10 feet activity   Assist  Walk 10 feet activity did not occur: Refused  Assist level: Contact Guard/Touching assist Assistive device: Walker-rolling   Walk 50 feet activity   Assist Walk 50 feet with 2 turns activity did not occur: (did not perform turning w/gait)  Assist level: Contact Guard/Touching assist Assistive device: Walker-rolling    Walk 150 feet activity   Assist    Assist level: Contact Guard/Touching assist Assistive device: Walker-rolling    Walk 10 feet on uneven surface  activity   Assist Walk 10 feet on uneven surfaces activity did not occur: Refused  Wheelchair     Assist   Type of Wheelchair: Educational psychologist activity did not occur: N/A  Wheelchair assist level: Supervision/Verbal cueing Max wheelchair distance: 37'    Wheelchair 50 feet with 2 turns activity    Assist    Wheelchair 50 feet with 2 turns activity did  not occur: N/A   Assist Level: Supervision/Verbal cueing   Wheelchair 150 feet activity     Assist  Wheelchair 150 feet activity did not occur: N/A       Blood pressure (!) 112/57, pulse 62, temperature 98 F (36.7 C), resp. rate 18, height 5\' 7"  (1.702 m), weight 71.9 kg, SpO2 98 %.  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  -f/u in the office [facility] in 4-6 weeks after discharge 2. Antithrombotics: -DVT/anticoagulation:Pharmaceutical:Lovenox -antiplatelet therapy: N/a 3. Pain Management:Ultram and/or oxycodone prn.    -continue scheduled tylenol qid for now.well controlled.- no C/os 4. Mood:LCSW to follow for evaluation and support when appropriate. -antipsychotic agents: seroquel 5. Neuropsych: This patientis notcapable of making decisions on hisown behalf.  Regular bed now   RLAS V   -5/21 continue on seroquel in late afternoon (1600) and at HS to help with sleep and PM agitation  5/27--continue same regimen 6. Skin/Wound Care:routine pressure relief measures. 7. Fluids/Electrolytes/Nutrition:good appetite  -f/u labs all reviewed 8. Agitation: At risk for falls.    - environmental mods  -continue propranolol when willing to use  -seroquel  Telesitter renewed 9. Hyperglycemia:  Resolved off tf  10. History of polysubstance abuse: Continue thiamine and folic acid.  11. Dysphagia: D2 thins for now  Eating well.   -recent labs acceptable   14.  Hyponatremia  Sodium 133 5/17---> 140  5/24   LOS: 30 days A FACE TO FACE EVALUATION WAS PERFORMED  Charlett Blake 09/17/2019, 12:14 PM

## 2019-09-17 NOTE — Progress Notes (Signed)
Occupational Therapy Weekly Progress Note  Patient Details  Name: Jeremiah West MRN: [id number] Date of Birth: 09-02-1950  Beginning of progress report period: Sep 10, 2019 End of progress report period: Sep 17, 2019  Today's Date: 09/17/2019 OT Individual Time: 0930-1030 OT Individual Time Calculation (min): 60 min    STG set to equal LTG d/t old plan to d/c SNF. Pt is at goal level for CGA-S level ADL performance. Sister showed up fo rfamily education today, however states, "I cant take him home. My house isnt equipped for him. I am not trained for this." OT provided extensive verbal and written instructions (see below). Communicated with sister that pt is at goal level and is ready for d/c.  Patient continues to demonstrate the following deficits: muscle weakness, decreased cardiorespiratoy endurance, motor apraxia, decreased coordination and decreased motor planning, decreased visual acuity, decreased attention to right, decreased attention, decreased awareness, decreased problem solving, decreased safety awareness and decreased memory and decreased sitting balance, decreased standing balance, decreased postural control and decreased balance strategies and therefore will continue to benefit from skilled OT intervention to enhance overall performance with BADL.  Patient progressing toward long term goals..  Continue plan of care.  OT Short Term Goals Week 4:  OT Short Term Goal 1 (Week 4): STG=LTG d/t ELOS OT Short Term Goal 1 - Progress (Week 4): Progressing toward goal  Skilled Therapeutic Interventions/Progress Updates:    1:1. Pt received in bed and sister arrives late, but present for most of session. OT educates on role/purpose of OT. Sister shown how pt able to transfer stand pivot with/without RW from EOB>w/c>shower chair. Sister initially resistant to seeing TTB and tells OT to just bathing pt in shower. Sister states, "my home isnt equipped for this" "I am not trained for this  like you are." Educated sister that the assistance that pt needs is for cognition/safety and she is able to provide this with the family education that is happening between all 3 disciplines. Sister views pt undress, but then steps out and makes herself coffee after shower starts. Education upon her return that pt would need her to be with him during all ADL tasks and that he only needed cuing to apply soap to rag and to terminate shower by turning off water. Pt dresses with supervision and sister provides S while he advances pants past hips using RW for support sit to stand. Pt and OT transfer into Tub using TTB and CGA. Sister provides CGA while he walks out back ot chair with VC for close proximity and keeping 1 hand on walker to keep him from pushing it too far. Pt reporting need to toilet and OT demo how he can walk RW over toilet for stability. Sister verbalized understanding but goes to make more coffee. Exited session with pt seated in w/c, exit alarm on, sister in room awaiting OT arrival. Handout provided to get TTB.  Session 2: written instructions provided for sister Written Jeremiah West's Discharge Instructions  - If Jeremiah West gets up, he always needs to have someone with him standing next to him with one hand on walker and one hand on him.  - To get up he needs to push up from the surface he is getting up from and have one hand on walker - Same if he is sitting. When he goes to sit the walker should be squared up to the surface, not out to the side and he should reach one arm back to whatever he is  sitting on.  - When Jeremiah West sits into the wheelchair or stands up, it should be locked and leg rests swung out of the way. - Tub transfer bench is the only recommended shower equipment (handout provided), otherwise it is safest for Jeremiah West to wash up at the sink seated. - Jeremiah West can wash all body parts and only needs cuing to add soap to wash cloth and turn off the water when he is done. Sticker strips can be  found at [employer] to be added to the bottom of the tub to decrease dexters chance of falling when standing to wash his butt. Otherwise he can lean to the side and wash it, but he needs to be told to do so.  - Dressing- Jeremiah West can dress himself with increased time. If he stands up he should have his walker in front of him. He should NOT stand to put his legs in his pants only SIT. - Jeremiah West needs someone to walk into the bathroom with him to go to the bathroom and stay with him while he voids. He is impulsive and will get up on his own because of his poor safety awareness/impulsivity and may fall without someone there.  - It is not recommended that Jeremiah West be in the kitchen/cook because of his cognitive deficits. If he needs something he should use his wheelchair to push himself in there and obtain the items. The brakes need to be locked if he is reaching for items anytime.  Therapy Documentation Precautions:  Precautions Precautions: Fall Precaution Comments: fall risk. no longer in need of restraints Restrictions Weight Bearing Restrictions: No General:   Vital Signs:  Pain: Pain Assessment Pain Scale: 0-10 Pain Score: 0-No pain Faces Pain Scale: No hurt ADL: ADL Grooming: Supervision/safety Where Assessed-Grooming: Sitting at sink Upper Body Bathing: Moderate assistance, Supervision/safety Where Assessed-Upper Body Bathing: Sitting at sink Lower Body Bathing: Moderate assistance, Contact guard Where Assessed-Lower Body Bathing: Sitting at sink Upper Body Dressing: Supervision/safety Where Assessed-Upper Body Dressing: Sitting at sink, Standing at sink Lower Body Dressing: Contact guard Where Assessed-Lower Body Dressing: Sitting at sink, Standing at sink Toileting: Contact guard Where Assessed-Toileting: Teacher, adult education: Furniture conservator/restorer Method: Surveyor, minerals: Scientist, research (medical)    Praxis   Exercises:   Other  Treatments:     Therapy/Group: Individual Therapy  Shon Hale 09/17/2019, 12:23 PM

## 2019-09-18 NOTE — Progress Notes (Signed)
Pt refusing all night medications. Pt was educated on the importance of taking the medication ordered by the doctor. Pt states " I dont care what the doctor ordered. I dont need to take any of that stuff". Pt asked this nurse to just turn the light off and leave him alone.

## 2019-09-19 ENCOUNTER — Inpatient Hospital Stay (HOSPITAL_COMMUNITY): Payer: Medicare Other

## 2019-09-19 ENCOUNTER — Inpatient Hospital Stay (HOSPITAL_COMMUNITY): Payer: Medicare Other | Admitting: Physical Therapy

## 2019-09-19 LAB — BASIC METABOLIC PANEL
Anion gap: 8 (ref 5–15)
BUN: 14 mg/dL (ref 8–23)
CO2: 28 mmol/L (ref 22–32)
Calcium: 9.4 mg/dL (ref 8.9–10.3)
Chloride: 104 mmol/L (ref 98–111)
Creatinine, Ser: 1.19 mg/dL (ref 0.61–1.24)
GFR calc Af Amer: >60 mL/min (ref >60)
GFR calc non Af Amer: >60 mL/min (ref >60)
Glucose, Bld: 106 mg/dL — ABNORMAL HIGH (ref 70–99)
Potassium: 3.8 mmol/L (ref 3.5–5.1)
Sodium: 140 mmol/L (ref 135–145)

## 2019-09-19 LAB — CBC
HCT: 41.1 % (ref 39.0–52.0)
Hemoglobin: 13.5 g/dL (ref 13.0–17.0)
MCH: 32.1 pg (ref 26.0–34.0)
MCHC: 32.8 g/dL (ref 30.0–36.0)
MCV: 97.9 fL (ref 80.0–100.0)
Platelets: 187 10*3/uL (ref 150–400)
RBC: 4.2 MIL/uL — ABNORMAL LOW (ref 4.22–5.81)
RDW: 12.1 % (ref 11.5–15.5)
WBC: 3.8 10*3/uL — ABNORMAL LOW (ref 4.0–10.5)
nRBC: 0 % (ref 0.0–0.2)

## 2019-09-19 NOTE — Progress Notes (Addendum)
Physical Therapy Weekly Progress Note  Patient Details  Name: Jeremiah West MRN: [id number] Date of Birth: 07-22-1950  Beginning of progress report period: Sep 09, 2019 End of progress report period: Sep 19, 2019  Today's Date: 09/19/2019 PT Individual Time: 1115-1140 AND 1502-1510 PT Individual Time Calculation (min): 25 min AND 8 min   Patient continues to make slow progress towards LTGs 2/2 poor carryover, cognitive deficits, and poor participation.   Patient continues to demonstrate the following deficits muscle weakness, decreased cardiorespiratoy endurance, unbalanced muscle activation, motor apraxia, decreased coordination and decreased motor planning, decreased attention to right and decreased motor planning, decreased initiation, decreased attention, decreased awareness, decreased problem solving, decreased safety awareness, decreased memory and delayed processing and decreased standing balance, decreased postural control and decreased balance strategies and therefore will continue to benefit from skilled PT intervention to increase functional independence with mobility. Will continue to work towards safe d/c to home w/ sister or decreasing burden of care for SNF. This therapist believes he is benefiting from a comprehensive and intensive rehab program to address all aspects of the individual and to maximize his functional level in anticipation of d/c to SNF.   Patient progressing toward long term goals. Continue plan of care. LTGs of supervision-CGA remain appropriate at this time. Pt met bed mobility, sit<>stand, and stand pivot transfer goal. Gait goal upgraded to CGA to decrease burden of care.   PT Short Term Goals Week 4:  PT Short Term Goal 1 (Week 4): =LTGs due to ELOS Week 5:  PT Short Term Goal 1 (Week 5): =LTGs due to ELOS  Skilled Therapeutic Interventions/Progress Updates:   Session 1:  Pt in supine and agreeable to therapy, no c/o pain. Pt less confused this session,  he stated "where are we going, therapy?" before therapist had even mentioned physical therapy. Supervision bed mobility and sit<>stand to RW. Ambulated to day room w/ CGA-min assist w/ RW, ~100'. Verbal and visual cues to bring RW closer to him and for upright posture. Performed NuStep 5 min x2 @ level 3 w/ all extremities to work on endurance and global strengthening. Verbal reminders to attend to task and to remember number of steps therapist told him to stop at, however pt unable to retain any information in working memory. Ambulated back to room, min assist w/ same cues as detailed above. Ended session in w/c at nurses station, all needs met. Pt declined returning to bed.   Session 2:  Pt received attempting to get OOB to toilet. Sit>stand w/ supervision and ambulated to/from toilet w/ CGA using RW, verbal and tactile cues for RW management. Pt stood w/ close supervision to void in toilet. Ambulated to sink and stood to wash hands w/ supervision. Verbal and tactile cues for sequencing and technique. Pt refused further activity, requesting to return to bed. Ended session in supine, all needs in reach.   Therapy Documentation Precautions:  Precautions Precautions: Fall Precaution Comments: fall risk. no longer in need of restraints Restrictions Weight Bearing Restrictions: No Pain: Pain Assessment Pain Scale: 0-10 Pain Score: 0-No pain  Therapy/Group: Individual Therapy  Amy [name] 09/19/2019, 12:20 PM

## 2019-09-19 NOTE — Plan of Care (Signed)
  Problem: RH Balance Goal: LTG Patient will maintain dynamic sitting balance (PT) Description: LTG:  Patient will maintain dynamic sitting balance with assistance during mobility activities (PT) Outcome: Completed/Met Flowsheets (Taken 09/19/2019 1724) LTG: Pt will maintain dynamic sitting balance during mobility activities with:: (Goal completed/met on 5/31 - Amy T) Supervision/Verbal cueing Note: Goal completed/met on 5/31 - Amy T   Problem: Sit to Stand Goal: LTG:  Patient will perform sit to stand with assistance level (PT) Description: LTG:  Patient will perform sit to stand with assistance level (PT) Outcome: Completed/Met Flowsheets (Taken 09/19/2019 1724) LTG: PT will perform sit to stand in preparation for functional mobility with assistance level: (Goal completed/met on 5/31 - Amy T) Supervision/Verbal cueing Note: Goal completed/met on 5/31 - Amy T   Problem: RH Bed Mobility Goal: LTG Patient will perform bed mobility with assist (PT) Description: LTG: Patient will perform bed mobility with assistance, with/without cues (PT). Outcome: Completed/Met Flowsheets (Taken 09/19/2019 1724) LTG: Pt will perform bed mobility with assistance level of: (Goal completed/met on 5/31 - Amy T) --   Problem: RH Bed to Chair Transfers Goal: LTG Patient will perform bed/chair transfers w/assist (PT) Description: LTG: Patient will perform bed to chair transfers with assistance (PT). Outcome: Completed/Met Flowsheets (Taken 09/19/2019 1724) LTG: Pt will perform Bed to Chair Transfers with assistance level: (Goal completed/met on 5/31 - Amy T) Contact Guard/Touching assist Note: Goal completed/met on 5/31 - Amy T   Problem: RH Ambulation Goal: LTG Patient will ambulate in controlled environment (PT) Description: LTG: Patient will ambulate in a controlled environment, # of feet with assistance (PT). Flowsheets (Taken 09/19/2019 1724) LTG: Pt will ambulate in controlled environ  assist needed::  (Goal upgraded on 5/31 - Amy T) Contact Guard/Touching assist LTG: Ambulation distance in controlled environment: 150' w/ LRAD Note: Goal upgraded on 5/31 - Amy T

## 2019-09-19 NOTE — Plan of Care (Signed)
  Problem: RH BOWEL ELIMINATION Goal: RH STG MANAGE BOWEL WITH ASSISTANCE Description: STG Manage Bowel with min Assistance. Outcome: Not Progressing   Problem: RH BLADDER ELIMINATION Goal: RH STG MANAGE BLADDER WITH ASSISTANCE Description: STG Manage Bladder With min Assistance Outcome: Not Progressing

## 2019-09-19 NOTE — Progress Notes (Signed)
Occupational Therapy Session Note  Patient Details  Name: RAWLINS STUARD MRN: [id number] Date of Birth: 08/05/1950  Today's Date: 09/19/2019 OT Individual Time: 2947-6546 OT Individual Time Calculation (min): 13 min    Short Term Goals: Week 4:  OT Short Term Goal 1 (Week 4): STG=LTG d/t ELOS OT Short Term Goal 1 - Progress (Week 4): Progressing toward goal  Skilled Therapeutic Interventions/Progress Updates:    Pt received at nurses desk in w/c. Pt declined any functional mobility or ADLs initially but then reported he needed to use the bathroom. Pt's RW was obtained despite pt stating "I don't need that". Pt used RW with CGA to return to his room. He required cueing/correction for RW management. He stood at the toilet and attempted to void urine with no success but stating he was finished. Pt declined any further participation and returned to bed. Bed alarm set and all needs met.   Therapy Documentation Precautions:  Precautions Precautions: Fall Precaution Comments: fall risk. no longer in need of restraints Restrictions Weight Bearing Restrictions: No  Therapy/Group: Individual Therapy  Curtis Sites 09/19/2019, 6:51 AM

## 2019-09-19 NOTE — Progress Notes (Signed)
Patient ID: Jeremiah West, male   DOB: [birth year], 69 y.o.   MRN: [id number]   Reports indicate pt appeal was approved through [facility]. SW will follow-up with insurance tomorrow due to holiday and offices are closed.   Cecile Sheerer, MSW, LCSWA Office: [phone] Cell: [phone] Fax: [phone]

## 2019-09-19 NOTE — Progress Notes (Signed)
Jeremiah West PHYSICAL MEDICINE & REHABILITATION PROGRESS NOTE   Subjective/Complaints: No new issues. Tried to get up oob on his own. Ate breakfast  ROS: limited by behavior and cognition.     No results found. Recent Labs    09/19/19 0513  WBC 3.8*  HGB 13.5  HCT 41.1  PLT 187   Recent Labs    09/19/19 0513  NA 140  K 3.8  CL 104  CO2 28  GLUCOSE 106*  BUN 14  CREATININE 1.19  CALCIUM 9.4    Intake/Output Summary (Last 24 hours) at 09/19/2019 1018 Last data filed at 09/18/2019 1900 Gross per 24 hour  Intake 150 ml  Output --  Net 150 ml     Physical Exam: Vital Signs Blood pressure 122/84, pulse 83, temperature 97.9 F (36.6 C), temperature source Oral, resp. rate 20, height 5\' 7"  (1.702 m), weight 69.4 kg, SpO2 96 %.    Constitutional: No distress . Vital signs reviewed. HEENT: EOMI, oral membranes moist Neck: supple Cardiovascular: RRR without murmur. No JVD    Respiratory/Chest: CTA Bilaterally without wheezes or rales. Normal effort    GI/Abdomen: BS +, non-tender, non-distended Ext: no clubbing, cyanosis, or edema Psych: pleasant, distracted Skin: No evidence of breakdown, no evidence of rash Neurologic: restless, oriented to self, hospital, moves all ext  Musculoskeletal: Full range of motion in all 4 extremities. No joint swelling   Assessment/Plan: 1. Functional deficits secondary to TBI which require 3+ hours per day of interdisciplinary therapy in a comprehensive inpatient rehab setting.  Physiatrist is providing close team supervision and 24 hour management of active medical problems listed below.  Physiatrist and rehab team continue to assess barriers to discharge/monitor patient progress toward functional and medical goals  Care Tool:  Bathing  Bathing activity did not occur: Refused Body parts bathed by patient: Right arm, Left arm, Chest, Abdomen, Front perineal area, Right upper leg, Left upper leg, Face, Right lower leg, Left lower  leg, Buttocks   Body parts bathed by helper: Buttocks     Bathing assist Assist Level: Supervision/Verbal cueing     Upper Body Dressing/Undressing Upper body dressing   What is the patient wearing?: Pull over shirt    Upper body assist Assist Level: Supervision/Verbal cueing    Lower Body Dressing/Undressing Lower body dressing      What is the patient wearing?: Pants, Incontinence brief     Lower body assist Assist for lower body dressing: Supervision/Verbal cueing     Toileting Toileting    Toileting assist Assist for toileting: Moderate Assistance - Patient 50 - 74%(assit with wheelchair)     Transfers Chair/bed transfer  Transfers assist  Chair/bed transfer activity did not occur: Safety/medical concerns  Chair/bed transfer assist level: Contact Guard/Touching assist     Locomotion Ambulation   Ambulation assist   Ambulation activity did not occur: Refused(pt refused gait w/o AD)  Assist level: Contact Guard/Touching assist Assistive device: Walker-rolling Max distance: 100'   Walk 10 feet activity   Assist  Walk 10 feet activity did not occur: Refused  Assist level: Contact Guard/Touching assist Assistive device: Walker-rolling   Walk 50 feet activity   Assist Walk 50 feet with 2 turns activity did not occur: (did not perform turning w/gait)  Assist level: Contact Guard/Touching assist Assistive device: Walker-rolling    Walk 150 feet activity   Assist    Assist level: Contact Guard/Touching assist Assistive device: Walker-rolling    Walk 10 feet on uneven surface  activity  Assist Walk 10 feet on uneven surfaces activity did not occur: Refused         Wheelchair     Assist   Type of Wheelchair: Manual Wheelchair activity did not occur: N/A  Wheelchair assist level: Supervision/Verbal cueing Max wheelchair distance: 74'    Wheelchair 50 feet with 2 turns activity    Assist    Wheelchair 50 feet with 2  turns activity did not occur: N/A   Assist Level: Supervision/Verbal cueing   Wheelchair 150 feet activity     Assist  Wheelchair 150 feet activity did not occur: N/A       Blood pressure 122/84, pulse 83, temperature 97.9 F (36.6 C), temperature source Oral, resp. rate 20, height 5\' 7"  (1.702 m), weight 69.4 kg, SpO2 96 %.  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  -f/u in the office [facility] in 4-6 weeks after discharge 2. Antithrombotics: -DVT/anticoagulation:Pharmaceutical:Lovenox -antiplatelet therapy: N/a 3. Pain Management:Ultram and/or oxycodone prn.    -continue scheduled tylenol qid for now.well controlled.- no C/os 4. Mood:LCSW to follow for evaluation and support when appropriate. -antipsychotic agents: seroquel 5. Neuropsych: This patientis notcapable of making decisions on hisown behalf.  Regular bed now   RLAS V   -5/21 continue on seroquel in late afternoon (1600) and at HS to help with sleep and PM agitation  5/31--no changes 6. Skin/Wound Care:routine pressure relief measures. 7. Fluids/Electrolytes/Nutrition:good appetite  -f/u labs all reviewed 8. Agitation: At risk for falls.    - environmental mods  -continue propranolol when willing to use  -seroquel  Telesitter still required 9. Hyperglycemia:  Resolved off tf  10. History of polysubstance abuse: Continue thiamine and folic acid.  11. Dysphagia: D2 thins for now  Eating well.   -recent labs acceptable   14.  Hyponatremia  Sodium 133 5/17---> 140  5/24 and 5/31   LOS: 32 days A FACE TO FACE EVALUATION WAS PERFORMED  6/31 09/19/2019, 10:18 AM

## 2019-09-20 ENCOUNTER — Inpatient Hospital Stay (HOSPITAL_COMMUNITY): Payer: Medicare Other

## 2019-09-20 ENCOUNTER — Inpatient Hospital Stay (HOSPITAL_COMMUNITY): Payer: Medicare Other | Admitting: Physical Therapy

## 2019-09-20 NOTE — Progress Notes (Signed)
Physical Therapy Session Note  Patient Details  Name: Jeremiah West MRN: [id number] Date of Birth: 03-17-1951  Today's Date: 09/20/2019 PT Individual Time: 0920-0955 PT Individual Time Calculation (min): 35 min   Short Term Goals: Week 5:  PT Short Term Goal 1 (Week 5): =LTGs due to ELOS  Skilled Therapeutic Interventions/Progress Updates:   Pt received toileting in care of NT, therapist took over. Pt continent of void in standing w/ CGA for safety. Stand pivot to shower bench w/ CGA w/o use of RW. Pt agreeable to perform bathing and dressing this session, focus on functional endurance, command following, and attention to functional task. Undressed w/ supervision from seated, verbal cues for technique and sequencing. Bathed at seated level w/ supervision, however max verbal and tactile cues as pt perseverating on bathing washing feet and legs, needed cues to move on to next body part. Sit>stand w/ use of grab bar w/ close supervision while pt bathed buttocks and peri-area. Stand pivot to w/c w/ close supervision w/ use of grab bar. Performed upper body dressing w/ supervision, lower body dressing w/ min assist and verbal cues for technique and sequencing w/ both. Sit>stand to RW w/ supervision to pull pants over hips. Pt appreciative of assistance to bathe. Transported to nurses station in w/c, ended session in w/c and under direct supervision of nurse, all needs met.   Therapy Documentation Precautions:  Precautions Precautions: Fall Precaution Comments: fall risk. no longer in need of restraints Restrictions Weight Bearing Restrictions: No  Therapy/Group: Individual Therapy  Kristan Brummitt [name] 09/20/2019, 9:59 AM

## 2019-09-20 NOTE — Progress Notes (Signed)
Occupational Therapy Session Note  Patient Details  Name: Jeremiah West MRN: [id number] Date of Birth: 1950-07-14  Today's Date: 09/20/2019 OT Individual Time: 1133-1200 OT Individual Time Calculation (min): 27 min    Short Term Goals: Week 4:  OT Short Term Goal 1 (Week 4): STG=LTG d/t ELOS OT Short Term Goal 1 - Progress (Week 4): Progressing toward goal  Skilled Therapeutic Interventions/Progress Updates:    Pt received in w/c at nurses desk. Session focused on overall strength and endurance 2/2 ADL needs being met earlier. Pt used RW to complete functional mobility throughout session at [facility] level, requiring mod cueing for safe RW management. Pt sat and completed BUE strengthening circuit with 4 lb dumbbells, requiring activities to be graded down when completing overhead reaching. Pt then transitioned to prone on mat and completed modified push ups with min guard for safety. Pt was returned to his w/c at the nurses desk and left with all needs met, chair alarm set.   Therapy Documentation Precautions:  Precautions Precautions: Fall Precaution Comments: fall risk. no longer in need of restraints Restrictions Weight Bearing Restrictions: No  Therapy/Group: Individual Therapy  Curtis Sites 09/20/2019, 6:47 AM

## 2019-09-20 NOTE — Progress Notes (Signed)
Physical Therapy Discharge Summary  Patient Details  Name: Jeremiah West MRN: [id number] Date of Birth: 10-Mar-1951   Patient has met 8 of 9 long term goals due to improved activity tolerance, improved balance, improved postural control, ability to compensate for deficits, functional use of  right lower extremity and left lower extremity, improved attention, improved awareness and improved coordination.  Patient to discharge at an ambulatory level Min Assist (CGA).   Pt's sister has observed therapy sessions and undergone formal family education and is  unavailable and unable to provide the necessary physical and cognitive assistance at discharge and declined participating in any hands-on training during education session.  Reasons goals not met: Patient continues to require max cues for recall during mobility and ADLs.   Recommendation:  Patient will benefit from ongoing skilled PT services in skilled nursing facility setting to continue to advance safe functional mobility, address ongoing impairments in standing balance, endurance and global strengthening, and safety awareness, and minimize fall risk.  Equipment: RW, light weight w/c. Hospital bed recommended for patient's safety at home due to impulsivity, decreased safety awareness, decreased balance, also there is no bed available for him at his sister's at d/c.  Reasons for discharge: treatment goals met and discharge from hospital  Patient/family agrees with progress made and goals achieved: Yes  PT Discharge Precautions/Restrictions Precautions Precautions: Fall Precaution Comments: fall risk. 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, over/undershoots UE movements consistent w/ depth perception impairments, no other significant impairments observable w/ functional mobility Perception Perception:  Impaired Inattention/Neglect: Does not attend to right visual field;Does not attend to right side of body(improved since evaluation) Praxis Praxis: Impaired Praxis Impairment Details: Motor planning;Initiation;Ideomotor;Perseveration Praxis-Other Comments: all improved since evaluation  Cognition Overall Cognitive Status: Impaired/Different from baseline Arousal/Alertness: Awake/alert Orientation Level: Oriented to person;Oriented to situation;Disoriented to place;Disoriented to time Attention: Sustained Sustained Attention: Impaired Sustained Attention Impairment: Verbal basic;Functional basic Memory: Impaired Memory Impairment: Decreased recall of new information;Decreased short term memory Decreased Short Term Memory: Verbal basic;Functional basic Awareness: Impaired Awareness Impairment: Intellectual impairment Problem Solving: Impaired Problem Solving Impairment: Verbal basic;Functional basic Executive Function: (all impaired due to lower level deficits) Behaviors: Impulsive Safety/Judgment: Impaired [employer] Scales of Cognitive Functioning: Confused/appropriate Sensation Sensation Light Touch: Appears Intact Coordination Gross Motor Movements are Fluid and Coordinated: No Coordination and Movement Description: slow movement, unable to follow motor commands for formal testing Motor  Motor Motor: Motor apraxia;Abnormal postural alignment and control;Ataxia Motor - Discharge Observations: Motor apraxia, R>L, improved since eval  Mobility Bed Mobility Bed Mobility: Rolling Right;Rolling Left;Supine to Sit;Sit to Supine Rolling Right: Supervision/verbal cueing Rolling Left: Supervision/Verbal cueing Supine to Sit: Supervision/Verbal cueing Sit to Supine: Supervision/Verbal cueing Transfers Transfers: Sit to Stand;Stand to Sit;Stand Pivot Transfers Sit to Stand: Supervision/Verbal cueing Stand to Sit: Supervision/Verbal cueing Stand Pivot Transfers:  Supervision/Verbal cueing Stand Pivot Transfer Details: Verbal cues for sequencing;Verbal cues for precautions/safety Transfer (Assistive device): None Locomotion  Gait Ambulation: Yes Gait Assistance: Contact Guard/Touching assist Gait Distance (Feet): 150 Feet Assistive device: Rolling walker Gait Assistance Details: Verbal cues for technique;Verbal cues for precautions/safety;Verbal cues for safe use of DME/AE;Tactile cues for posture Gait Gait: Yes Gait Pattern: Step-through pattern;Decreased hip/knee flexion - left;Decreased hip/knee flexion - right;Wide base of support;Trunk flexed;Decreased trunk rotation Gait velocity: decreased Stairs / Additional Locomotion Stairs: No(unable due to congnitive deficits and decreases safety awareness) Wheelchair Mobility Wheelchair Mobility: No  Trunk/Postural Assessment  Cervical Assessment Cervical Assessment: Exceptions to WFL(forward head) Thoracic Assessment Thoracic Assessment: Exceptions to WFL(rounded shoulders) Lumbar Assessment Lumbar Assessment: Exceptions to WFL(posterior pelvic tilt) Postural Control Postural Control: Deficits on evaluation(delayed/insufficient, improved since evaluation)  Balance Balance Balance Assessed: Yes Static Sitting Balance Static Sitting - Level of Assistance: 5: Stand by assistance Dynamic Sitting Balance Dynamic Sitting - Level of Assistance: 5: Stand by assistance Static Standing Balance Static Standing - Balance Support: During functional activity Static Standing - Level of Assistance: 5: Stand by assistance(CGA) Dynamic Standing Balance Dynamic Standing - Level of Assistance: 5: Stand by assistance(CGA) Extremity Assessment  RLE Assessment RLE Assessment: Exceptions to [facility] Active Range of Motion (AROM) Comments: WFL for all funational mobility General Strength Comments: Generalized weakness, grossly at least 3+/5 with mobility LLE Assessment LLE Assessment: Exceptions to [facility] Active  Range of Motion (AROM) Comments: WFL for all funational mobility General Strength Comments: Generalized weakness, grossly at least 3+/5 with mobility    Amy [name], PT, DPT Apolinar Junes, PT, DPT 09/22/2019, 3:38 PM

## 2019-09-20 NOTE — Progress Notes (Signed)
Nutrition Follow-up  DOCUMENTATION CODES:   Non-severe (moderate) malnutrition in context of chronic illness  INTERVENTION:   Continue MVI daily  Continue Ensure Enlive po TID, each supplement provides 350 kcal and 20 grams of protein  Continue to encourage adequate po intake  NUTRITION DIAGNOSIS:   Moderate Malnutrition related to chronic illness(chronic medical issues and/or ETOH abuse) as evidenced by mild fat depletion, moderate muscle depletion.  Ongoing.  GOAL:   Patient will meet greater than or equal to 90% of their needs  Progressing.   MONITOR:   PO intake, Supplement acceptance, Labs, Weight trends  REASON FOR ASSESSMENT:   Consult Enteral/tube feeding initiation and management, Calorie Count  ASSESSMENT:   69 year old male with PMH of depression, T2DM, polysubstance abuse, daily EtOH use. Pt was admitted after being found at the base of 16 stairs with slurred speech. Pt was found to have left frontal and temporal SDH, small left temporal SAH, nondisplaced fracture of right temporal calvarium extending to right mastoid and right mastoid effusion with hemorrhage. Other incidental findings include moderate right and small left hydrocele, contusion right hip, emphysema, multiple remote bilateral rib fractures, volume overload with interstitial edema, and calcified pleural plaques compatible with prior asbestos exposure. Pt is tolerating POs but tube feeds remain in place. Pt has had issues with fluctuating mental status with agitation and confusion requiring restraints. Admitted to CIR on 4/29.  5/02 Cortrak removed  Pt with good appetite. Per RN, pt refusing medications but did accept Ensure.  PO Intake: 30-100% x last 8 recorded meals (76% average meal intake)  Labs reviewed. Medications reviewed and include: Colace, Ensure Enlive TID, Folvite, MVI, Miralax, Thiamine  Diet Order:   Diet Order            DIET DYS 2 Room service appropriate? Yes; Fluid  consistency: Thin  Diet effective now              EDUCATION NEEDS:   Not appropriate for education at this time  Skin:  Skin Assessment: Reviewed RN Assessment  Last BM:  6/1 type 4  Height:   Ht Readings from Last 1 Encounters:  08/18/19 5\' 7"  (1.702 m)    Weight:   Wt Readings from Last 1 Encounters:  09/20/19 69.5 kg    Ideal Body Weight:  67.3 kg  BMI:  Body mass index is 24 kg/m.  Estimated Nutritional Needs:   Kcal:  2000-2200  Protein:  100-115 grams  Fluid:  >/= 2.0 L   11/20/19, MS, RD, LDN RD pager number and weekend/on-call pager number located in [place].

## 2019-09-20 NOTE — Progress Notes (Signed)
[place] PHYSICAL MEDICINE & REHABILITATION PROGRESS NOTE   Subjective/Complaints: No new complaints. Eating breakfast.   ROS: Limited due to cognitive/behavioral      No results found. Recent Labs    09/19/19 0513  WBC 3.8*  HGB 13.5  HCT 41.1  PLT 187   Recent Labs    09/19/19 0513  NA 140  K 3.8  CL 104  CO2 28  GLUCOSE 106*  BUN 14  CREATININE 1.19  CALCIUM 9.4    Intake/Output Summary (Last 24 hours) at 09/20/2019 1103 Last data filed at 09/19/2019 1724 Gross per 24 hour  Intake 450 ml  Output --  Net 450 ml     Physical Exam: Vital Signs Blood pressure 101/78, pulse 79, temperature 97.6 F (36.4 C), temperature source Oral, resp. rate 18, height 5\' 7"  (1.702 m), weight 69.5 kg, SpO2 96 %.    Constitutional: No distress . Vital signs reviewed. HEENT: EOMI, oral membranes moist Neck: supple Cardiovascular: RRR without murmur. No JVD    Respiratory/Chest: CTA Bilaterally without wheezes or rales. Normal effort    GI/Abdomen: BS +, non-tender, non-distended Ext: no clubbing, cyanosis, or edema Psych: cooperative but impulsive.  Skin: No evidence of breakdown, no evidence of rash Neurologic: restless, oriented to self/hospital, moves all ext  Musculoskeletal: Full range of motion in all 4 extremities. No joint swelling   Assessment/Plan: 1. Functional deficits secondary to TBI which require 3+ hours per day of interdisciplinary therapy in a comprehensive inpatient rehab setting.  Physiatrist is providing close team supervision and 24 hour management of active medical problems listed below.  Physiatrist and rehab team continue to assess barriers to discharge/monitor patient progress toward functional and medical goals  Care Tool:  Bathing  Bathing activity did not occur: Refused Body parts bathed by patient: Right arm, Left arm, Chest, Abdomen, Front perineal area, Right upper leg, Left upper leg, Face, Right lower leg, Left lower leg, Buttocks    Body parts bathed by helper: Buttocks     Bathing assist Assist Level: Supervision/Verbal cueing     Upper Body Dressing/Undressing Upper body dressing   What is the patient wearing?: Pull over shirt    Upper body assist Assist Level: Supervision/Verbal cueing    Lower Body Dressing/Undressing Lower body dressing      What is the patient wearing?: Pants, Incontinence brief     Lower body assist Assist for lower body dressing: Supervision/Verbal cueing     Toileting Toileting    Toileting assist Assist for toileting: Moderate Assistance - Patient 50 - 74%(assit with wheelchair)     Transfers Chair/bed transfer  Transfers assist  Chair/bed transfer activity did not occur: Safety/medical concerns  Chair/bed transfer assist level: Contact Guard/Touching assist     Locomotion Ambulation   Ambulation assist   Ambulation activity did not occur: Refused(pt refused gait w/o AD)  Assist level: Minimal Assistance - Patient > 75% Assistive device: Walker-rolling Max distance: 100'   Walk 10 feet activity   Assist  Walk 10 feet activity did not occur: Refused  Assist level: Minimal Assistance - Patient > 75% Assistive device: Walker-rolling   Walk 50 feet activity   Assist Walk 50 feet with 2 turns activity did not occur: (did not perform turning w/gait)  Assist level: Minimal Assistance - Patient > 75% Assistive device: Walker-rolling    Walk 150 feet activity   Assist    Assist level: Contact Guard/Touching assist Assistive device: Walker-rolling    Walk 10 feet on uneven  surface  activity   Assist Walk 10 feet on uneven surfaces activity did not occur: Refused         Wheelchair     Assist   Type of Wheelchair: Manual Wheelchair activity did not occur: N/A  Wheelchair assist level: Supervision/Verbal cueing Max wheelchair distance: 7'    Wheelchair 50 feet with 2 turns activity    Assist    Wheelchair 50 feet with 2  turns activity did not occur: N/A   Assist Level: Supervision/Verbal cueing   Wheelchair 150 feet activity     Assist  Wheelchair 150 feet activity did not occur: N/A       Blood pressure 101/78, pulse 79, temperature 97.6 F (36.4 C), temperature source Oral, resp. rate 18, height 5\' 7"  (1.702 m), weight 69.5 kg, SpO2 96 %.  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  -f/u in the office [facility] in 4-6 weeks after discharge 2. Antithrombotics: -DVT/anticoagulation:Pharmaceutical:Lovenox -antiplatelet therapy: N/a 3. Pain Management:Ultram and/or oxycodone prn.    -continue scheduled tylenol qid for now.well controlled.- no C/os 4. Mood:LCSW to follow for evaluation and support when appropriate. -antipsychotic agents: seroquel 5. Neuropsych: This patientis notcapable of making decisions on hisown behalf.  Regular bed now   RLAS V   -5/21 continue on seroquel in late afternoon (1600) and at HS to help with sleep and PM agitation  6/1 has restless moment but generally more controlled 6. Skin/Wound Care:routine pressure relief measures. 7. Fluids/Electrolytes/Nutrition:good appetite  -f/u labs   reviewed 8. Agitation: At risk for falls.    - environmental mods  -continue propranolol when willing to use  -seroquel  Telesitter still required 9. Hyperglycemia:  Resolved off tf  10. History of polysubstance abuse: Continue thiamine and folic acid.  11. Dysphagia: D2 thins for now  Eating well.   -recent labs acceptable   14.  Hyponatremia  Sodium 133 5/17---> 140  5/24 and 5/31   LOS: 33 days A FACE TO FACE EVALUATION WAS PERFORMED  Jeremiah West 09/20/2019, 11:03 AM

## 2019-09-20 NOTE — Progress Notes (Signed)
Patient refused lovenox injection scheduled for 20:00. Patient educated on importance, still refused.

## 2019-09-20 NOTE — Progress Notes (Addendum)
Patient ID: Jeremiah West, male   DOB: [birth year], 69 y.o.   MRN: [id number]  SW spoke with Okey Regal with [facility] [phone]) who reported appeal was overturned for extended stay not SNF; approved through 6/3 her CIR with review due on 6/3. SW transferred to supervisor Crissy with [employer] [facility] to discuss denial. Reports pt was denied on 5/25 due to pt being here for 26 days and able to walk with Mid Asst ambulating 33ft with RWl CG and stand pivot with transfers; Mid Asst all ADLs. States SW can call fast appeals or fax over information.   SW spoke with [facility] with fast appeals (p:[id number]/f:[phone]) to discuss appealing SNF denial; Authorization #: [id number]. Reports SNF submitted. States will need to call back tomorrow to get updates and appeals case #. SW faxed updated clinicals to fast appeals/last 3 days.  SW called [name] with [phone]) to discuss referral but out of office and encouraged to call [name] with CAP/DA [phone]) or Satira Sark RN/CM [phone]). SW left a message for both.   SW returned phone call to [employer] [phone]) to complete assessment and explained pt current status. SW will need to resubmit a new referral within 48hrs prior to d/c.   *SW received return phone call from [name] with CAP/DA who will fax SW application tomorrow.   Cecile Sheerer, MSW, LCSWA Office: [phone] Cell: [phone] Fax: [phone]
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
## 2019-09-20 NOTE — Progress Notes (Addendum)
Patient ID: Jeremiah West, male   DOB: 03-Oct-1950, 69 y.o.   MRN: [id number] Attempted to call [name], [name]'s sister, to give her an update on his new discharge date. There was no answer so a voicemail was left for her to return my call.  1425:  Ms. Maisie Fus returned phone call at 1425, and I explained to her that [facility] had been approved for home PT, OT, ST, and CNA assistance. I informed her that [facility] in [place]. [place], [place] had declined him because he is now to high functioning. His insurance has approved him through 09/22/19 and he will then be discharged from CIR. At that time she wanted to know "how many days per week HH would come out," and I told her that I would call her back with that information.  1707:  This RN, Care Coordinator called Ms. Maisie Fus back to let her know that [facility] would come out to see [name], in the home 2-3 times per week, for approximately 20-30 days. I also informed her that we were waiting for CAP/DA to fax everything from the referral we sent last week. I again let her know that he would be discharged on 09/22/19. She then let me know that she "had made a call to [facility]". She stated to me that she is "trying to get him placed at this facility because he is at a higher functioning level now." She will let me know tomorrow.  Kennyth Arnold, RN, BSN, CRRN RN, Care Coordinator  Office [phone] Cell [phone]

## 2019-09-21 ENCOUNTER — Inpatient Hospital Stay (HOSPITAL_COMMUNITY): Payer: Medicare Other

## 2019-09-21 ENCOUNTER — Inpatient Hospital Stay (HOSPITAL_COMMUNITY): Payer: Medicare Other | Admitting: Physical Therapy

## 2019-09-21 ENCOUNTER — Inpatient Hospital Stay (HOSPITAL_COMMUNITY): Payer: Medicare Other | Admitting: Speech Pathology

## 2019-09-21 MED ORDER — ACETAMINOPHEN 325 MG PO TABS
650.0000 mg | ORAL_TABLET | Freq: Four times a day (QID) | ORAL | Status: DC | PRN
  Administered 2019-09-21: 650 mg via ORAL
  Filled 2019-09-21: qty 2

## 2019-09-21 NOTE — Progress Notes (Signed)
Physical Therapy Session Note  Patient Details  Name: Jeremiah West MRN: [id number] Date of Birth: Jul 23, 1950  Today's Date: 09/21/2019 PT Individual Time: 0838-0905 PT Individual Time Calculation (min): 27 min   Short Term Goals: Week 5:  PT Short Term Goal 1 (Week 5): =LTGs due to ELOS  Skilled Therapeutic Interventions/Progress Updates:   Pt received supine in bed and agreeable to PT. Supine>sit transfer without assist or cues. Pt donned pants with set up assist and moderate cues for orientation of clothing. Once dressed pt reports need to urinate. Ambulatory transfer to toilet for urination, continent void. Supervision assist for balance and clothing management at toilet. Hand hygiene performed at sink in standing with supervision assist.   Gait training with RW x 114f and 1286fwith CGA-supervision assist for clothing management as well as cues for posture, decreased speed and to keep RW [facility] reduce [facility] risk.   Per pt request,  performed BUE exercises sitting EOB bicep curl x12, chest press x 5 and overhead press x5 with min cues for ROM and safety. Pt returned to room and performed ambulatory transfer to bed with supervision assist and RW. Sit>supine completed without assist, and left supine in bed with call bell in reach and all needs met.        Therapy Documentation Precautions:  Precautions Precautions: Fall Precaution Comments: fall risk. no longer in need of restraints Restrictions Weight Bearing Restrictions: No    Pain:   denies    Therapy/Group: Individual Therapy  AuLorie Phenix/05/2019, 9:05 AM

## 2019-09-21 NOTE — Progress Notes (Signed)
Speech Language Pathology Daily Session Note  Patient Details  Name: Jeremiah West MRN: [id number] Date of Birth: 07-28-1950  Today's Date: 09/21/2019 SLP Individual Time: 1000-1040 SLP Individual Time Calculation (min): 40 min  Short Term Goals: Week 5: SLP Short Term Goal 1 (Week 5): STGs=LTGs due to ELOS  Skilled Therapeutic Interventions: Skilled treatment session focused on cognitive goals. SLP facilitated session by providing total A for orientation to place, time and situation. Visual aids were initiated to maximize recall of information which patient was able to utilize with extra time and Min verbal cues. Patient with intermittent neologisms and verbal perseveration with minimal awareness and total A to self-correct. Patient unable to complete generative naming tasks and required Max verbal cues to write simple CVC words, however, unable to determine patient's baseline reading and writing abilities at this time. Patient left at RN station with alarm on and all needs within reach. Continue with current plan of care.      Pain No/Denies Pain   Therapy/Group: Individual Therapy  Demetrio Leighty 09/21/2019, 1:03 PM

## 2019-09-21 NOTE — Plan of Care (Signed)
°  Problem: Consults Goal: RH BRAIN INJURY PATIENT EDUCATION Description: Description: See Patient Education module for eduction specifics Outcome: Progressing   Problem: RH BOWEL ELIMINATION Goal: RH STG MANAGE BOWEL WITH ASSISTANCE Description: STG Manage Bowel with min Assistance. Outcome: Progressing   Problem: RH BLADDER ELIMINATION Goal: RH STG MANAGE BLADDER WITH ASSISTANCE Description: STG Manage Bladder With min Assistance Outcome: Progressing

## 2019-09-21 NOTE — Progress Notes (Signed)
Occupational Therapy Session Note  Patient Details  Name: Jeremiah West MRN: [id number] Date of Birth: August 01, 1950  Today's Date: 09/21/2019 OT Individual Time: 7425-9563 OT Individual Time Calculation (min): 43 min    Short Term Goals: Week 4:  OT Short Term Goal 1 (Week 4): STG=LTG d/t ELOS OT Short Term Goal 1 - Progress (Week 4): Progressing toward goal  Skilled Therapeutic Interventions/Progress Updates:    Pt received sitting up in w/c at the nurses desk agreeable to session. Pt used RW to complete functional mobility into the therapy gym. He sat at the table and completed pipe tree building activity for functional problem solving and assessing visualmotor abilities. After initial demonstration and max cueing pt was able to rebuild the same structure with no cueing and only increased time! With slightly increased difficulty pt was unable to complete with max cueing. Pt then completed painting activity with small paintbrush and picture. Pt demonstrated improved sustained attention with significant noise in the background and [place] with RUE. Once he began pt required no cueing to stay on task or for task completion. Pt able to stay within the lines, demonstrating more visual acuity than previously observed. Pt returned to his w/c at the nurses desk and was left with chair alarm belt fastened, all needs met.   Therapy Documentation Precautions:  Precautions Precautions: Fall Precaution Comments: fall risk. no longer in need of restraints Restrictions Weight Bearing Restrictions: No   Therapy/Group: Individual Therapy  Curtis Sites 09/21/2019, 10:58 AM

## 2019-09-21 NOTE — Progress Notes (Signed)
[place] PHYSICAL MEDICINE & REHABILITATION PROGRESS NOTE   Subjective/Complaints: Up in bed. No new problems overnight  ROS: Limited due to cognitive/behavioral     No results found. Recent Labs    09/19/19 0513  WBC 3.8*  HGB 13.5  HCT 41.1  PLT 187   Recent Labs    09/19/19 0513  NA 140  K 3.8  CL 104  CO2 28  GLUCOSE 106*  BUN 14  CREATININE 1.19  CALCIUM 9.4    Intake/Output Summary (Last 24 hours) at 09/21/2019 0827 Last data filed at 09/20/2019 1820 Gross per 24 hour  Intake 460 ml  Output --  Net 460 ml     Physical Exam: Vital Signs Blood pressure 92/61, pulse 70, temperature 98.2 F (36.8 C), resp. rate 17, height 5\' 7"  (1.702 m), weight 70.6 kg, SpO2 96 %.    Constitutional: No distress . Vital signs reviewed. HEENT: EOMI, oral membranes moist Neck: supple Cardiovascular: RRR without murmur. No JVD    Respiratory/Chest: CTA Bilaterally without wheezes or rales. Normal effort    GI/Abdomen: BS +, non-tender, non-distended Ext: no clubbing, cyanosis, or edema Psych: pleasant, confused.  Skin: No evidence of breakdown, no evidence of rash Neurologic: oriented to self, hospital  Musculoskeletal: Full range of motion in all 4 extremities. No joint swelling   Assessment/Plan: 1. Functional deficits secondary to TBI which require 3+ hours per day of interdisciplinary therapy in a comprehensive inpatient rehab setting.  Physiatrist is providing close team supervision and 24 hour management of active medical problems listed below.  Physiatrist and rehab team continue to assess barriers to discharge/monitor patient progress toward functional and medical goals  Care Tool:  Bathing  Bathing activity did not occur: Refused Body parts bathed by patient: Right arm, Left arm, Chest, Abdomen, Front perineal area, Right upper leg, Left upper leg, Face, Right lower leg, Left lower leg, Buttocks   Body parts bathed by helper: Buttocks     Bathing assist  Assist Level: Supervision/Verbal cueing     Upper Body Dressing/Undressing Upper body dressing   What is the patient wearing?: Pull over shirt    Upper body assist Assist Level: Supervision/Verbal cueing    Lower Body Dressing/Undressing Lower body dressing      What is the patient wearing?: Pants, Incontinence brief     Lower body assist Assist for lower body dressing: Supervision/Verbal cueing     Toileting Toileting    Toileting assist Assist for toileting: Moderate Assistance - Patient 50 - 74%(assit with wheelchair)     Transfers Chair/bed transfer  Transfers assist  Chair/bed transfer activity did not occur: Safety/medical concerns  Chair/bed transfer assist level: Contact Guard/Touching assist     Locomotion Ambulation   Ambulation assist   Ambulation activity did not occur: Refused(pt refused gait w/o AD)  Assist level: Minimal Assistance - Patient > 75% Assistive device: Walker-rolling Max distance: 100'   Walk 10 feet activity   Assist  Walk 10 feet activity did not occur: Refused  Assist level: Minimal Assistance - Patient > 75% Assistive device: Walker-rolling   Walk 50 feet activity   Assist Walk 50 feet with 2 turns activity did not occur: (did not perform turning w/gait)  Assist level: Minimal Assistance - Patient > 75% Assistive device: Walker-rolling    Walk 150 feet activity   Assist    Assist level: Contact Guard/Touching assist Assistive device: Walker-rolling    Walk 10 feet on uneven surface  activity   Assist Walk  10 feet on uneven surfaces activity did not occur: Refused         Wheelchair     Assist   Type of Wheelchair: Manual Wheelchair activity did not occur: N/A  Wheelchair assist level: Supervision/Verbal cueing Max wheelchair distance: 60'    Wheelchair 50 feet with 2 turns activity    Assist    Wheelchair 50 feet with 2 turns activity did not occur: N/A   Assist Level:  Supervision/Verbal cueing   Wheelchair 150 feet activity     Assist  Wheelchair 150 feet activity did not occur: N/A       Blood pressure 92/61, pulse 70, temperature 98.2 F (36.8 C), resp. rate 17, height 5\' 7"  (1.702 m), weight 70.6 kg, SpO2 96 %.  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  -f/u in the office [facility]R in 4-6 weeks after discharge 2. Antithrombotics: -DVT/anticoagulation:Pharmaceutical:Lovenox -antiplatelet therapy: N/a 3. Pain Management:Ultram and/or oxycodone prn.    -continue scheduled tylenol qid for now.well controlled.- no C/os 4. Mood:LCSW to follow for evaluation and support when appropriate. -antipsychotic agents: seroquel 5. Neuropsych: This patientis notcapable of making decisions on hisown behalf.  Regular bed now   RLAS V   -5/21 continue on seroquel in late afternoon (1600) and at HS to help with sleep and PM agitation  6/2 has restless moment but generally more controlled 6. Skin/Wound Care:routine pressure relief measures. 7. Fluids/Electrolytes/Nutrition:good appetite  -f/u labs   reviewed 8. Agitation: At risk for falls.    - environmental mods  -continue propranolol when willing to use  -seroquel  Telesitter still required 9. Hyperglycemia:  Resolved off tf  10. History of polysubstance abuse: Continue thiamine and folic acid.  11. Dysphagia: D2 thins for now  Eating well.   -recent labs acceptable   14.  Hyponatremia  Sodium 133 5/17---> 140  5/24 and 5/31   LOS: 34 days A FACE TO FACE EVALUATION WAS PERFORMED  Meredith Staggers 09/21/2019, 8:27 AM

## 2019-09-21 NOTE — Progress Notes (Signed)
Patient ID: Jeremiah West, male   DOB: [birth year], 69 y.o.   MRN: [id number]  Left voicemail with Ms. Maisie Fus, patient's sister, explaining that pt is covered through 6/3 with d/c on 6/4 to her home. Requested her physical address to set up [facility] visits. I again stated on the voicemail that the patient would receive PT, OT, and ST 2-3 a week. A CNA would come 1-2 times per week for 1-2 hours. These services would last for 20-30 days. Ms. Maisie Fus also informed of CAP/DA application and the need for her physical address to complete and fax form to the state. Office number was left on voicemail and I await her return phone call.  1430: Ms. Maisie Fus returned my phone call and gave her physical address of [address] [place], [place] [address]. She asked that the [facility] agency to call her until after 2:30 pm and I said that I would pass that message on to them. She wanted to know if it were possible to get Mr. Baena a hospital bed with rails? I told her that I would look into it and give her a call back when I had more information.  1600: Spoke with Ms. Maisie Fus to see if Mr. Dobie was going to have a regular bed or a sofa to sleep on, and she stated, "that he would not." I told her that I would let the team and insurance know and get back with her when I had an answer. She verbally understood that the answer may most likely come tomorrow or after.  Kennyth Arnold, RN, BSN, CRRN Office: [phone] Cell: [phone]

## 2019-09-21 NOTE — Progress Notes (Addendum)
Patient ID: Jeremiah West, male   DOB: 1950/11/11, 69 y.o.   MRN: [id number]  SW received fax from [name] stating pt is covered through 6/3 with d/c on 6/4.   SW spoke with [facility] with fast appeals (p:[phone]/[phone]) to discuss appeal status for SNF; Authorization #: [id number].   SW spoke with [name] [phone]) will accept pt for HHPT/OT/ST/SW. SW informed on pt current situation, and SW will follow-up once pt is ready for d/c.  SW received CAP/DA application from [name] with CAP/DA [phone]). SW waiting on address for pt sister in order to complete and fax form to state.  SW spoke with [name] (p:[phone]) on Wednesday, June 16 at 2:30am with [name], [name]  *Address for sister: [address], [place] [address]  SW faxed CAP/DA referral to [name] (p:[phone]/[phone]). SW submitted/faxed PCS referral to [name] [phone]).   Cecile Sheerer, MSW, LCSWA Office: [phone] Cell: [phone] Fax: [phone]

## 2019-09-22 ENCOUNTER — Inpatient Hospital Stay (HOSPITAL_COMMUNITY): Payer: Medicare Other

## 2019-09-22 LAB — SARS CORONAVIRUS 2 (TAT 6-24 HRS): SARS Coronavirus 2: NEGATIVE

## 2019-09-22 NOTE — Progress Notes (Signed)
[place] PHYSICAL MEDICINE & REHABILITATION PROGRESS NOTE   Subjective/Complaints: No new problems. Up in chair.   ROS: Limited due to cognitive/behavioral    No results found. No results for input(s): WBC, HGB, HCT, PLT in the last 72 hours. No results for input(s): NA, K, CL, CO2, GLUCOSE, BUN, CREATININE, CALCIUM in the last 72 hours.  Intake/Output Summary (Last 24 hours) at 09/22/2019 1003 Last data filed at 09/21/2019 1258 Gross per 24 hour  Intake 240 ml  Output --  Net 240 ml     Physical Exam: Vital Signs Blood pressure 98/60, pulse 69, temperature 98.5 F (36.9 C), resp. rate 16, height 5\' 7"  (1.702 m), weight 71 kg, SpO2 98 %.    Constitutional: No distress . Vital signs reviewed. HEENT: EOMI, oral membranes moist Neck: supple Cardiovascular: RRR without murmur. No JVD    Respiratory/Chest: CTA Bilaterally without wheezes or rales. Normal effort    GI/Abdomen: BS +, non-tender, non-distended Ext: no clubbing, cyanosis, or edema Psych: pleasant confused Skin: No evidence of breakdown, no evidence of rash Neurologic: oriented to self, hospital, follows simple commands. Moves all 4's Musculoskeletal: Full range of motion in all 4 extremities. No joint swelling   Assessment/Plan: 1. Functional deficits secondary to TBI which require 3+ hours per day of interdisciplinary therapy in a comprehensive inpatient rehab setting.  Physiatrist is providing close team supervision and 24 hour management of active medical problems listed below.  Physiatrist and rehab team continue to assess barriers to discharge/monitor patient progress toward functional and medical goals  Care Tool:  Bathing  Bathing activity did not occur: Refused Body parts bathed by patient: Right arm, Left arm, Chest, Abdomen, Front perineal area, Right upper leg, Left upper leg, Face, Right lower leg, Left lower leg, Buttocks   Body parts bathed by helper: Buttocks     Bathing assist Assist Level:  Supervision/Verbal cueing     Upper Body Dressing/Undressing Upper body dressing   What is the patient wearing?: Pull over shirt    Upper body assist Assist Level: Supervision/Verbal cueing    Lower Body Dressing/Undressing Lower body dressing      What is the patient wearing?: Pants, Incontinence brief     Lower body assist Assist for lower body dressing: Supervision/Verbal cueing     Toileting Toileting    Toileting assist Assist for toileting: Moderate Assistance - Patient 50 - 74%(assit with wheelchair)     Transfers Chair/bed transfer  Transfers assist  Chair/bed transfer activity did not occur: Safety/medical concerns  Chair/bed transfer assist level: Supervision/Verbal cueing     Locomotion Ambulation   Ambulation assist   Ambulation activity did not occur: Refused(pt refused gait w/o AD)  Assist level: Contact Guard/Touching assist Assistive device: Walker-rolling Max distance: 150   Walk 10 feet activity   Assist  Walk 10 feet activity did not occur: Refused  Assist level: Contact Guard/Touching assist Assistive device: Walker-rolling   Walk 50 feet activity   Assist Walk 50 feet with 2 turns activity did not occur: (did not perform turning w/gait)  Assist level: Contact Guard/Touching assist Assistive device: Walker-rolling    Walk 150 feet activity   Assist    Assist level: Contact Guard/Touching assist Assistive device: Walker-rolling    Walk 10 feet on uneven surface  activity   Assist Walk 10 feet on uneven surfaces activity did not occur: Refused         Wheelchair     Assist   Type of Wheelchair:  activity did not occur: N/A  Wheelchair assist level: Supervision/Verbal cueing Max wheelchair distance: 36'    Wheelchair 50 feet with 2 turns activity    Assist    Wheelchair 50 feet with 2 turns activity did not occur: N/A   Assist Level: Supervision/Verbal cueing   Wheelchair 150  feet activity     Assist  Wheelchair 150 feet activity did not occur: N/A       Blood pressure 98/60, pulse 69, temperature 98.5 F (36.9 C), resp. rate 16, height 5\' 7"  (1.702 m), weight 71 kg, SpO2 98 %.  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  -f/u in the office [facility] in 4-6 weeks after discharge 2. Antithrombotics: -DVT/anticoagulation:Pharmaceutical:Lovenox -antiplatelet therapy: N/a 3. Pain Management:Ultram and/or oxycodone prn.    -continue scheduled tylenol qid for now.reasonable control 4. Mood:LCSW to follow for evaluation and support when appropriate. -antipsychotic agents: seroquel 5. Neuropsych: This patientis notcapable of making decisions on hisown behalf.  Regular bed now , continue telesitter  RLAS V   -5/21 continue on seroquel in late afternoon (1600) and at HS to help with sleep and PM agitation  6/2 has restless moment but generally more controlled 6. Skin/Wound Care:routine pressure relief measures. 7. Fluids/Electrolytes/Nutrition:good appetite  -f/u labs   reviewed 8. Agitation: At risk for falls.    - environmental mods  -continue propranolol when willing to use  -seroquel at least in PM  Telesitter still required 9. Hyperglycemia:  Resolved off tf  10. History of polysubstance abuse: Continue thiamine and folic acid.  11. Dysphagia: D2 thins for now  Eating well.   -recent labs acceptable   14.  Hyponatremia  Sodium 133 5/17---> 140  5/24 and 5/31   LOS: 35 days A FACE TO FACE EVALUATION WAS PERFORMED  Meredith Staggers 09/22/2019, 10:03 AM

## 2019-09-22 NOTE — Progress Notes (Signed)
Patient ID: Jeremiah West, male   DOB: 1950/12/07, 69 y.o.   MRN: [id number]  Left voicemail with Ms. Maisie Fus, pt sister requesting her 3 choices for [facility] Agency's. Informed on the voicemail that the information was needed before end of day. Will wait on return phone call.  1525: Ms. Maisie Fus returned AM phone call. Was able to inform her that Mr. [employer] had given approval for 100 day stay at [facility] in [place], [place]. She said she could provide transportation on 09/23/19. Plans made for her to arrive at 1230 on 6/4 for pick up of pt. Staff would assist her and pt down to car. She would be provided with discharge packet and his belongings. Liaison from facility to call her today.  Kennyth Arnold, RN, BSN, CRRN Office: [phone] Cell: [phone]

## 2019-09-22 NOTE — Progress Notes (Signed)
Pt refused to take medication. Pt refused to allow NT to obtain temperature with all routes. Pt refused for NT nor Writer to scan pt identification band.

## 2019-09-22 NOTE — Plan of Care (Signed)
  Problem: Consults Goal: RH BRAIN INJURY PATIENT EDUCATION Description: Description: See Patient Education module for eduction specifics Outcome: Progressing   Problem: RH BOWEL ELIMINATION Goal: RH STG MANAGE BOWEL WITH ASSISTANCE Description: STG Manage Bowel with min Assistance. Outcome: Progressing   Problem: RH BLADDER ELIMINATION Goal: RH STG MANAGE BLADDER WITH ASSISTANCE Description: STG Manage Bladder With min Assistance Outcome: Progressing   Problem: RH SAFETY Goal: RH STG ADHERE TO SAFETY PRECAUTIONS W/ASSISTANCE/DEVICE Description: STG Adhere to Safety Precautions With min/mod Assistance/Device. Outcome: Progressing   Problem: RH COGNITION-NURSING Goal: RH STG USES MEMORY AIDS/STRATEGIES W/ASSIST TO PROBLEM SOLVE Description: STG Uses Memory Aids/Strategies With min/mod Assistance to Problem Solve. Outcome: Progressing Goal: RH STG ANTICIPATES NEEDS/CALLS FOR ASSIST W/ASSIST/CUES Description: STG Anticipates Needs/Calls for Assist With min/mod Assistance/Cues. Outcome: Progressing   Problem: RH KNOWLEDGE DEFICIT BRAIN INJURY Goal: RH STG INCREASE KNOWLEDGE OF SELF CARE AFTER BRAIN INJURY Description: With min/mod assist Outcome: Progressing Goal: RH STG INCREASE KNOWLEDGE OF DYSPHAGIA/FLUID INTAKE Description: With min/mod assist Outcome: Progressing

## 2019-09-22 NOTE — Progress Notes (Addendum)
Patient ID: Jeremiah West, male   DOB: [birth year], 69 y.o.   MRN: [id number]  SW received updates from [place]/CAP/DA([phone]/[phone])  informing pt application has been submitted.   SW sent HH orders - PT/OT/ST/CNA/SW to [facility] with [facility] [phone])  SW confirmed with [employer] [phone]) pt will d/c tomorrow. Reports referral will be sent to nursing to complete phone assessment.  SW completed phone assessment with Holly/RN with [employer] [phone]). Pt approved for 60 hours until nursing assessment if completed.   SW spoke with Ana/[employer] with [employer] appeals (p:[phone]/f:[phone]) to discuss appeal status for SNF; Authorization #: [id number] stating that his appeal over turned and pt was approved for 100 days/Authorizzation#: [id number] [place].   SW spoke with Maryan Rued with [facility] [phone]) to inform on approval by insurance. States admission coordinator is Earl Lites [phone]) and will follow-up with her to discuss pt admission and verify authorization. Also states she will contact pt sister [name] [phone])  Pt sister [name] provided updates by Dominica Severin. She intends to be here between 11am-12pm for pick up tomorrow.   *Will need to verify tomorrow if SNF is still willing to take patient. Will need to cancel [facility], referral to [facility], and new patient appointment if pt is able to discharge to SNF.   Cecile Sheerer, MSW, LCSWA Office: [phone] Cell: [phone] Fax: [phone]

## 2019-09-22 NOTE — Progress Notes (Signed)
Occupational Therapy Session Note  Patient Details  Name: Jeremiah West MRN: [id number] Date of Birth: 02/05/1951  Today's Date: 09/22/2019 OT Individual Time: 7371-0626 OT Individual Time Calculation (min): 10 min    Short Term Goals: Week 5:     Skilled Therapeutic Interventions/Progress Updates:    1:1. Pt received in bed refusing all tx but pleasant. Pt declining all OOB mobility despite education on completing last day activities. Pt states, "ill shower tomorrow." OT facilitates orientation conversation with pt signing, "contract" stating name and date and pt will shower tomorrow with OT. Pt required MAX multimodal cuing to locate calendar and write date. Exited session with pt seated in bed, exit alarm on and call light in reach  Therapy Documentation Precautions:  Precautions Precautions: Fall Precaution Comments: fall risk. no longer in need of restraints Restrictions Weight Bearing Restrictions: No General:   Vital Signs:   Pain: Pain Assessment Pain Scale: 0-10 Pain Score: 0-No pain ADL: ADL Grooming: Supervision/safety Where Assessed-Grooming: Sitting at sink Upper Body Bathing: Moderate assistance, Supervision/safety Where Assessed-Upper Body Bathing: Sitting at sink Lower Body Bathing: Moderate assistance, Contact guard Where Assessed-Lower Body Bathing: Sitting at sink Upper Body Dressing: Supervision/safety Where Assessed-Upper Body Dressing: Sitting at sink, Standing at sink Lower Body Dressing: Contact guard Where Assessed-Lower Body Dressing: Sitting at sink, Standing at sink Toileting: Contact guard Where Assessed-Toileting: Teacher, adult education: Furniture conservator/restorer Method: Surveyor, minerals: Scientist, research (medical)    Praxis   Exercises:   Other Treatments:     Therapy/Group: Individual Therapy  Shon Hale 09/22/2019, 11:13 AM

## 2019-09-22 NOTE — Progress Notes (Signed)
Physical Therapy Session Note  Patient Details  Name: Jeremiah West MRN: [id number] Date of Birth: [birth year]  Today's Date: 09/22/2019 PT Individual Time: 0076-2263 PT Individual Time Calculation (min): 30 min   Short Term Goals: Week 1:  PT Short Term Goal 1 (Week 1): Pt will tolerate 60 min of upright activity w/ minimal increase in fatigue PT Short Term Goal 1 - Progress (Week 1): Met PT Short Term Goal 2 (Week 1): Pt will initiate stair training PT Short Term Goal 2 - Progress (Week 1): Progressing toward goal PT Short Term Goal 3 (Week 1): Pt will ambulate 25' w/ mod assist w/ LRAD PT Short Term Goal 3 - Progress (Week 1): Progressing toward goal PT Short Term Goal 4 (Week 1): Pt will initiate functional tasks w/ min cues 50% of the time PT Short Term Goal 4 - Progress (Week 1): Met Week 2:  PT Short Term Goal 1 (Week 2): Pt will initiate stair training PT Short Term Goal 1 - Progress (Week 2): Not progressing PT Short Term Goal 2 (Week 2): Pt will ambulate 1' w/ mod assist consistently using LRAD PT Short Term Goal 2 - Progress (Week 2): Met PT Short Term Goal 3 (Week 2): Pt will initiate functional tasks w/ only min cues 100% of the time PT Short Term Goal 3 - Progress (Week 2): Progressing toward goal PT Short Term Goal 4 (Week 2): Pt will perform bed<>chair transfer w/ CGA PT Short Term Goal 4 - Progress (Week 2): Progressing toward goal Week 3:  PT Short Term Goal 1 (Week 3): Pt will initiate functional tasks w/ min cues 100% of the time PT Short Term Goal 1 - Progress (Week 3): Met PT Short Term Goal 2 (Week 3): Pt will ambulate 57' w/ min assist w/ LRAD consistently PT Short Term Goal 2 - Progress (Week 3): Progressing toward goal PT Short Term Goal 3 (Week 3): Pt will maintain selective attention to functional tasks in controlled environment w/ min assist PT Short Term Goal 3 - Progress (Week 3): Met  Skilled Therapeutic Interventions/Progress Updates:    PAIN   Denies pain Pt initially refused scheduled session, but agreed later in am.  Pt seemed agitated and perseverating on using phone.  Pt stated he would get up to go "down there".  Pt supine to sit w/supervision.  STS w/cga, min assist for balance one in standing due to post tendency.  SPT bed to wc w/max cues for safety, very poor safety awareness, min assist for balance.  Once in wc, pt became fixated on securing alarm belt and became agitated when therapist attempted to assist w/this.   Noted pt w/wet brief, convinced pt to change brief.  STS w/cga, stood w/walker and attempts to remove brief by ripping it off, min assist required to remove and maintain balance.  Pt returns to sitting repeatedly during brief change.  Therapist partially donned brief in sitting then pt stood w/cga and walker while therapist secured.  Pt then raises pants from floor w/cga for balance. Pt then became fixated on "taking the phone with me". STS from wc with no attention to foot rest positioning, returned to sitting w/max cues from therapist and set up performed by therapist for safety.  Pt STS from wc and short distance gait to bed where he turned/sat w/min assist/max cues for safety.  Pt then proceeded to yank phone cord attempting to remove from wall.  Unable to distract pt who then stood and leaned  to wall in continued attempt to pull phone cord from wall.  Min assist for balance.  Therapist unable to distract pt, therefore removed phone from phone cord and detached phone given to patient which was agreeable to him.   Pt short distance gait to wc, turn/sit w/max cues and min assist. Pt then searching all around wc for alarm belt becoming agitated when he could not locate.  Pt returned belt to patient but pt would not allow therapist to donn the belt.  Pt transported to nurses station for safety/supervision due to refusal to return to bed or wear belt.  Therapist and NT were able to convince pt to wear the belt then pt became  calmer w/increased distraction of new environment.  Nurses aware pt at [place] and w/agiation issues.   Therapy Documentation Precautions:  Precautions Precautions: Fall Precaution Comments: fall risk. no longer in need of restraints Restrictions Weight Bearing Restrictions: No    Therapy/Group: Individual Therapy  Callie Fielding, Kimberly 09/22/2019, 12:35 PM

## 2019-09-23 DIAGNOSIS — H54415A Blindness right eye category 5, normal vision left eye: Secondary | ICD-10-CM | POA: Diagnosis not present

## 2019-09-23 DIAGNOSIS — R2689 Other abnormalities of gait and mobility: Secondary | ICD-10-CM | POA: Diagnosis not present

## 2019-09-23 DIAGNOSIS — M6281 Muscle weakness (generalized): Secondary | ICD-10-CM | POA: Diagnosis not present

## 2019-09-23 DIAGNOSIS — S7001XD Contusion of right hip, subsequent encounter: Secondary | ICD-10-CM | POA: Diagnosis not present

## 2019-09-23 DIAGNOSIS — R451 Restlessness and agitation: Secondary | ICD-10-CM | POA: Diagnosis not present

## 2019-09-23 DIAGNOSIS — S2243XD Multiple fractures of ribs, bilateral, subsequent encounter for fracture with routine healing: Secondary | ICD-10-CM | POA: Diagnosis not present

## 2019-09-23 DIAGNOSIS — I6200 Nontraumatic subdural hemorrhage, unspecified: Secondary | ICD-10-CM | POA: Diagnosis not present

## 2019-09-23 DIAGNOSIS — R262 Difficulty in walking, not elsewhere classified: Secondary | ICD-10-CM | POA: Diagnosis not present

## 2019-09-23 DIAGNOSIS — G479 Sleep disorder, unspecified: Secondary | ICD-10-CM | POA: Diagnosis not present

## 2019-09-23 DIAGNOSIS — K5900 Constipation, unspecified: Secondary | ICD-10-CM | POA: Diagnosis not present

## 2019-09-23 DIAGNOSIS — S065XAA Traumatic subdural hemorrhage with loss of consciousness status unknown, initial encounter: Secondary | ICD-10-CM

## 2019-09-23 DIAGNOSIS — Z87828 Personal history of other (healed) physical injury and trauma: Secondary | ICD-10-CM | POA: Diagnosis not present

## 2019-09-23 DIAGNOSIS — R1310 Dysphagia, unspecified: Secondary | ICD-10-CM | POA: Diagnosis not present

## 2019-09-23 DIAGNOSIS — S069X3S Unspecified intracranial injury with loss of consciousness of 1 hour to 5 hours 59 minutes, sequela: Secondary | ICD-10-CM | POA: Diagnosis not present

## 2019-09-23 DIAGNOSIS — E119 Type 2 diabetes mellitus without complications: Secondary | ICD-10-CM | POA: Diagnosis not present

## 2019-09-23 DIAGNOSIS — M2550 Pain in unspecified joint: Secondary | ICD-10-CM | POA: Diagnosis not present

## 2019-09-23 DIAGNOSIS — S062X9D Diffuse traumatic brain injury with loss of consciousness of unspecified duration, subsequent encounter: Secondary | ICD-10-CM | POA: Diagnosis not present

## 2019-09-23 DIAGNOSIS — J439 Emphysema, unspecified: Secondary | ICD-10-CM | POA: Diagnosis not present

## 2019-09-23 DIAGNOSIS — S065X9D Traumatic subdural hemorrhage with loss of consciousness of unspecified duration, subsequent encounter: Secondary | ICD-10-CM | POA: Diagnosis not present

## 2019-09-23 DIAGNOSIS — S0281XD Fracture of other specified skull and facial bones, right side, subsequent encounter for fracture with routine healing: Secondary | ICD-10-CM | POA: Diagnosis not present

## 2019-09-23 MED ORDER — ACETAMINOPHEN 325 MG PO TABS: 650.0000 mg | ORAL_TABLET | Freq: Four times a day (QID) | ORAL | Status: AC | PRN

## 2019-09-23 MED ORDER — QUETIAPINE FUMARATE 25 MG PO TABS: 25.0000 mg | ORAL_TABLET | Freq: Every day | ORAL | Status: AC | PRN

## 2019-09-23 MED ORDER — POLYETHYLENE GLYCOL 3350 17 G PO PACK: 17.0000 g | PACK | Freq: Every day | ORAL | 0 refills | Status: DC | PRN

## 2019-09-23 NOTE — Progress Notes (Signed)
[place] PHYSICAL MEDICINE & REHABILITATION PROGRESS NOTE   Subjective/Complaints: Pt lying in bed. Aware he was leaving today. Asked when his sister was coming  TKZ:SWFUXNA d/t cognition    No results found. No results for input(s): WBC, HGB, HCT, PLT in the last 72 hours. No results for input(s): NA, K, CL, CO2, GLUCOSE, BUN, CREATININE, CALCIUM in the last 72 hours.  Intake/Output Summary (Last 24 hours) at 09/23/2019 1048 Last data filed at 09/23/2019 0700 Gross per 24 hour  Intake 120 ml  Output 1 ml  Net 119 ml     Physical Exam: Vital Signs Blood pressure 103/60, pulse (!) 58, temperature 98.2 F (36.8 C), temperature source Oral, resp. rate 18, height 5\' 7"  (1.702 m), weight 70.3 kg, SpO2 99 %.    Constitutional: No distress . Vital signs reviewed. HEENT: EOMI, oral membranes moist Neck: supple Cardiovascular: RRR without murmur. No JVD    Respiratory/Chest: CTA Bilaterally without wheezes or rales. Normal effort    GI/Abdomen: BS +, non-tender, non-distended Ext: no clubbing, cyanosis, or edema Psych: pleasant and cooperative Skin: No evidence of breakdown, no evidence of rash Neurologic: oriented to self, hospital, that he was leaving today. he follows simple commands. Moves all 4's Musculoskeletal: Full range of motion in all 4 extremities. No joint swelling   Assessment/Plan: 1. Functional deficits secondary to TBI which require 3+ hours per day of interdisciplinary therapy in a comprehensive inpatient rehab setting.  Physiatrist is providing close team supervision and 24 hour management of active medical problems listed below.  Physiatrist and rehab team continue to assess barriers to discharge/monitor patient progress toward functional and medical goals  Care Tool:  Bathing  Bathing activity did not occur: Refused Body parts bathed by patient: Right arm, Left arm, Chest, Abdomen, Front perineal area, Right upper leg, Left upper leg, Face, Right lower  leg, Left lower leg, Buttocks   Body parts bathed by helper: Buttocks     Bathing assist Assist Level: Supervision/Verbal cueing     Upper Body Dressing/Undressing Upper body dressing   What is the patient wearing?: Pull over shirt    Upper body assist Assist Level: Supervision/Verbal cueing    Lower Body Dressing/Undressing Lower body dressing      What is the patient wearing?: Pants, Incontinence brief     Lower body assist Assist for lower body dressing: Supervision/Verbal cueing     Toileting Toileting    Toileting assist Assist for toileting: Moderate Assistance - Patient 50 - 74%(assit with wheelchair)     Transfers Chair/bed transfer  Transfers assist  Chair/bed transfer activity did not occur: Safety/medical concerns  Chair/bed transfer assist level: Minimal Assistance - Patient > 75%     Locomotion Ambulation   Ambulation assist   Ambulation activity did not occur: Refused(pt refused gait w/o AD)  Assist level: Contact Guard/Touching assist Assistive device: Walker-rolling Max distance: 150   Walk 10 feet activity   Assist  Walk 10 feet activity did not occur: Refused  Assist level: Contact Guard/Touching assist Assistive device: Walker-rolling   Walk 50 feet activity   Assist Walk 50 feet with 2 turns activity did not occur: (did not perform turning w/gait)  Assist level: Contact Guard/Touching assist Assistive device: Walker-rolling    Walk 150 feet activity   Assist    Assist level: Contact Guard/Touching assist Assistive device: Walker-rolling    Walk 10 feet on uneven surface  activity   Assist Walk 10 feet on uneven surfaces activity did not occur:  Refused         Wheelchair     Assist   Type of Wheelchair: Educational psychologist activity did not occur: N/A  Wheelchair assist level: Supervision/Verbal cueing Max wheelchair distance: 36'    Wheelchair 50 feet with 2 turns activity    Assist     Wheelchair 50 feet with 2 turns activity did not occur: N/A   Assist Level: Supervision/Verbal cueing   Wheelchair 150 feet activity     Assist  Wheelchair 150 feet activity did not occur: N/A       Blood pressure 103/60, pulse (!) 58, temperature 98.2 F (36.8 C), temperature source Oral, resp. rate 18, height 5\' 7"  (1.702 m), weight 70.3 kg, SpO2 99 %.  Medical Problem List and Plan: 1.Functional and cognitive deficitssecondary to TBI with skull fracture  Continue CIR  -RLAS V, slow progress likely linked to significant ETOH abuse, aphasic also  SNF placement today.  -f/u in the office [facility] in 4-6 weeks after discharge 2. Antithrombotics: -DVT/anticoagulation:Pharmaceutical:Lovenox -antiplatelet therapy: N/a 3. Pain Management:Ultram and/or oxycodone prn.    -continue scheduled tylenol qid for now.reasonable control 4. Mood:LCSW to follow for evaluation and support when appropriate. -antipsychotic agents: seroquel 5. Neuropsych: This patientis notcapable of making decisions on hisown behalf.  Regular bed now , continue telesitter  RLAS V   -5/21 continue on seroquel in late afternoon (1600) and at HS to help with sleep and PM agitation  6/4 generally improved.  6. Skin/Wound Care:routine pressure relief measures. 7. Fluids/Electrolytes/Nutrition:good appetite  -f/u labs   reviewed 8. Agitation: At risk for falls.    - environmental mods  -continue propranolol when willing to use  -seroquel at least in PM, can dc am seroquel  Telesitter still required 9. Hyperglycemia:  Resolved off tf  10. History of polysubstance abuse: Continue thiamine and folic acid.  11. Dysphagia: D2 thins for now  Eating well.   -recent labs acceptable   14.  Hyponatremia  Sodium 133 5/17---> 140  5/24 and 5/31   LOS: 36 days A FACE TO FACE EVALUATION WAS PERFORMED  Jeremiah West 09/23/2019, 10:48 AM

## 2019-09-23 NOTE — Progress Notes (Signed)
Patient ID: Jeremiah West, male   DOB: 1950/11/21, 69 y.o.   MRN: [id number]   SW spoke with Maryan Rued for [facility] [phone]) to follow-up about authorization. No issues reported. Plans for pt to d/c and be accepted today. Nurse Report to [phone]; Rm 125A. SW updated pt assigned Charity fundraiser.   SW spoke with Nora/[facility] [phone]) to cancel new patient appointment.  SW updated Mardella Layman Nash/CAP DA Case Manager on changes,however, informed pt will likely need once discharged from SNF.   SW called [employer] [employer] [phone]) to cancel referral.   *SW faxed d/c summary, AVS, and negative COVID test to Roddie Mc [phone].   Cecile Sheerer, MSW, LCSWA Office: [phone] Cell: [phone] Fax: [phone]

## 2019-09-23 NOTE — Progress Notes (Signed)
Inpatient Rehabilitation Care Coordinator  Discharge Note  The overall goal for the admission was met for:   Discharge location: Yes. D/c to [facility]; nurse report to [phone]. Liaison Theda Belfast [phone]).  Length of Stay: Yes. 36 days.   Discharge activity level: Yes. Supervision to Min A.  Home/community participation: Yes. Limited.  Services provided included: MD, RD, PT, OT, SLP, RN, CM, TR, Pharmacy, Neuropsych and SW  Financial Services: Medicaid and Private Insurance: [facility] Medicare  Follow-up services arranged: N/A  Comments (or additional information): contact pt sister Ivin Booty [phone]  Patient/Family verbalized understanding of follow-up arrangements: Yes  Individual responsible for coordination of the follow-up plan: Pt sister and SNF  Confirmed correct DME delivered: Rana Snare 09/23/2019    Rana Snare

## 2019-09-23 NOTE — Progress Notes (Signed)
Speech Language Pathology Discharge Summary  Patient Details  Name: Jeremiah West MRN: [id number] Date of Birth: Oct 22, 1950  Patient has met 6 of 7 long term goals.  Patient to discharge at overall Mod;Max level.   Reasons goals not met: Patient continues to require total A for intellectual awareness of deficits   Clinical Impression/Discharge Summary: Patient has made functional but inconsistent goals and has met 6 of 7 LTGs this admission. Currently, patient demonstrates behaviors consistent with a Rancho Level VI and requires overall Mod-Max A multimodal cues to complete functional and familiar tasks safely in regards to basic problem solving, sustained attention and orientation. However, patient continues to require total A for intellectual awareness of deficits and recall of daily information which impacts his overall safety. Patient also continues to demonstrate deficits in verbal expression and auditory comprehension but can verbalize his wants/needs at the phrase level and comprehend basic information with overall Min A multimodal cues. Patient is consuming Dys. 2 textures with thin liquids with minimal overt s/s of aspiration but requires Min-Mod A verbal cues for use of swallowing compensatory strategies. Patient's family is unable to provide the necessary level of physical and cognitive assistance needed at this time, therefore, patient will discharge to a SNF. Patient would benefit from f/u SLP services to maximize his cognitive-linguistic and swallowing function in order to maximize his overall functional independence and reduce caregiver burden.   Care Partner:  Caregiver Able to Provide Assistance: No  Type of Caregiver Assistance: Physical;Cognitive  Recommendation:  Skilled Nursing facility  Rationale for SLP Follow Up: Maximize cognitive function and independence;Reduce caregiver burden;Maximize swallowing safety;Maximize functional communication   Equipment: N/A   Reasons  for discharge: Discharged from hospital   Patient/Family Agrees with Progress Made and Goals Achieved: Yes    [place], [place] 09/23/2019, 9:57 AM

## 2019-09-23 NOTE — Discharge Instructions (Signed)
Inpatient Rehab Discharge Instructions  Jeremiah West Discharge date and time:  09/23/19  Activities/Precautions/ Functional Status: Activity: no lifting, driving, or strenuous exercise till cleared by MD Diet: dysphagia 2. Needs supervision with meals for safety. Wound Care: keep wound clean and dry   Functional status:  ___ No restrictions     ___ Walk up steps independently _X__ 24/7 supervision/assistance   ___ Walk up steps with assistance ___ Intermittent supervision/assistance  ___ Bathe/dress independently ___ Walk with walker     _X__ Bathe/dress with assistance ___ Walk Independently    ___ Shower independently _X__ Walk with assistance    ___ Shower with assistance _X__ No alcohol     ___ Return to work/school ________      GENERAL COMMUNITY RESOURCES FOR PATIENT/FAMILY: 1) Pt will need a need a PCP when he leaves nursing home.   2) CAP/DA referral- application was submitted on 09/21/2019. Please know that paperwork will be mailed to the home and will need to be completed and returned to [facility]. If further questions, please contact Cher Nakai [phone].   Special Instructions: 1. Absolutely no alcohol.    My questions have been answered and I understand these instructions. I will adhere to these goals and the provided educational materials after my discharge from the hospital.  Patient/Caregiver Signature _______________________________ Date __________  Clinician Signature _______________________________________ Date __________  Please bring this form and your medication list with you to all your follow-up doctor's appointments.

## 2019-09-23 NOTE — Progress Notes (Signed)
Pt discharged to SNF with family. Family given discharge packet and address of SNF. Report called to Nurse at [facility].

## 2019-09-27 DIAGNOSIS — S0281XD Fracture of other specified skull and facial bones, right side, subsequent encounter for fracture with routine healing: Secondary | ICD-10-CM | POA: Diagnosis not present

## 2019-09-27 DIAGNOSIS — S062X9D Diffuse traumatic brain injury with loss of consciousness of unspecified duration, subsequent encounter: Secondary | ICD-10-CM | POA: Diagnosis not present

## 2019-09-27 DIAGNOSIS — H54415A Blindness right eye category 5, normal vision left eye: Secondary | ICD-10-CM | POA: Diagnosis not present

## 2019-10-05 ENCOUNTER — Inpatient Hospital Stay (INDEPENDENT_AMBULATORY_CARE_PROVIDER_SITE_OTHER): Payer: Medicare Other | Admitting: Primary Care

## 2019-10-06 DIAGNOSIS — M2550 Pain in unspecified joint: Secondary | ICD-10-CM | POA: Diagnosis not present

## 2019-10-06 DIAGNOSIS — K5900 Constipation, unspecified: Secondary | ICD-10-CM | POA: Diagnosis not present

## 2019-10-10 DIAGNOSIS — S062X9D Diffuse traumatic brain injury with loss of consciousness of unspecified duration, subsequent encounter: Secondary | ICD-10-CM | POA: Diagnosis not present

## 2019-10-10 DIAGNOSIS — M6281 Muscle weakness (generalized): Secondary | ICD-10-CM | POA: Diagnosis not present

## 2019-10-10 DIAGNOSIS — S065X9D Traumatic subdural hemorrhage with loss of consciousness of unspecified duration, subsequent encounter: Secondary | ICD-10-CM | POA: Diagnosis not present

## 2019-10-10 DIAGNOSIS — R1310 Dysphagia, unspecified: Secondary | ICD-10-CM | POA: Diagnosis not present

## 2019-10-10 DIAGNOSIS — R2689 Other abnormalities of gait and mobility: Secondary | ICD-10-CM | POA: Diagnosis not present

## 2019-10-10 DIAGNOSIS — S2243XD Multiple fractures of ribs, bilateral, subsequent encounter for fracture with routine healing: Secondary | ICD-10-CM | POA: Diagnosis not present

## 2019-10-10 DIAGNOSIS — S0281XD Fracture of other specified skull and facial bones, right side, subsequent encounter for fracture with routine healing: Secondary | ICD-10-CM | POA: Diagnosis not present

## 2019-10-10 DIAGNOSIS — R262 Difficulty in walking, not elsewhere classified: Secondary | ICD-10-CM | POA: Diagnosis not present

## 2019-10-11 DIAGNOSIS — G479 Sleep disorder, unspecified: Secondary | ICD-10-CM | POA: Diagnosis not present

## 2019-10-11 DIAGNOSIS — S2243XD Multiple fractures of ribs, bilateral, subsequent encounter for fracture with routine healing: Secondary | ICD-10-CM | POA: Diagnosis not present

## 2019-10-11 DIAGNOSIS — R262 Difficulty in walking, not elsewhere classified: Secondary | ICD-10-CM | POA: Diagnosis not present

## 2019-10-11 DIAGNOSIS — I6200 Nontraumatic subdural hemorrhage, unspecified: Secondary | ICD-10-CM | POA: Diagnosis not present

## 2019-10-11 DIAGNOSIS — R1310 Dysphagia, unspecified: Secondary | ICD-10-CM | POA: Diagnosis not present

## 2019-10-11 DIAGNOSIS — S062X9D Diffuse traumatic brain injury with loss of consciousness of unspecified duration, subsequent encounter: Secondary | ICD-10-CM | POA: Diagnosis not present

## 2019-10-11 DIAGNOSIS — S065X9D Traumatic subdural hemorrhage with loss of consciousness of unspecified duration, subsequent encounter: Secondary | ICD-10-CM | POA: Diagnosis not present

## 2019-10-11 DIAGNOSIS — S0281XD Fracture of other specified skull and facial bones, right side, subsequent encounter for fracture with routine healing: Secondary | ICD-10-CM | POA: Diagnosis not present

## 2019-10-11 DIAGNOSIS — M6281 Muscle weakness (generalized): Secondary | ICD-10-CM | POA: Diagnosis not present

## 2019-10-11 DIAGNOSIS — R2689 Other abnormalities of gait and mobility: Secondary | ICD-10-CM | POA: Diagnosis not present

## 2019-10-12 DIAGNOSIS — M6281 Muscle weakness (generalized): Secondary | ICD-10-CM | POA: Diagnosis not present

## 2019-10-12 DIAGNOSIS — S0281XD Fracture of other specified skull and facial bones, right side, subsequent encounter for fracture with routine healing: Secondary | ICD-10-CM | POA: Diagnosis not present

## 2019-10-12 DIAGNOSIS — S065X9D Traumatic subdural hemorrhage with loss of consciousness of unspecified duration, subsequent encounter: Secondary | ICD-10-CM | POA: Diagnosis not present

## 2019-10-12 DIAGNOSIS — R2689 Other abnormalities of gait and mobility: Secondary | ICD-10-CM | POA: Diagnosis not present

## 2019-10-12 DIAGNOSIS — R262 Difficulty in walking, not elsewhere classified: Secondary | ICD-10-CM | POA: Diagnosis not present

## 2019-10-12 DIAGNOSIS — R1310 Dysphagia, unspecified: Secondary | ICD-10-CM | POA: Diagnosis not present

## 2019-10-12 DIAGNOSIS — S2243XD Multiple fractures of ribs, bilateral, subsequent encounter for fracture with routine healing: Secondary | ICD-10-CM | POA: Diagnosis not present

## 2019-10-12 DIAGNOSIS — S062X9D Diffuse traumatic brain injury with loss of consciousness of unspecified duration, subsequent encounter: Secondary | ICD-10-CM | POA: Diagnosis not present

## 2019-10-13 DIAGNOSIS — S0281XD Fracture of other specified skull and facial bones, right side, subsequent encounter for fracture with routine healing: Secondary | ICD-10-CM | POA: Diagnosis not present

## 2019-10-13 DIAGNOSIS — M6281 Muscle weakness (generalized): Secondary | ICD-10-CM | POA: Diagnosis not present

## 2019-10-13 DIAGNOSIS — S062X9D Diffuse traumatic brain injury with loss of consciousness of unspecified duration, subsequent encounter: Secondary | ICD-10-CM | POA: Diagnosis not present

## 2019-10-13 DIAGNOSIS — S065X9D Traumatic subdural hemorrhage with loss of consciousness of unspecified duration, subsequent encounter: Secondary | ICD-10-CM | POA: Diagnosis not present

## 2019-10-13 DIAGNOSIS — R262 Difficulty in walking, not elsewhere classified: Secondary | ICD-10-CM | POA: Diagnosis not present

## 2019-10-13 DIAGNOSIS — I6200 Nontraumatic subdural hemorrhage, unspecified: Secondary | ICD-10-CM | POA: Diagnosis not present

## 2019-10-13 DIAGNOSIS — S2243XD Multiple fractures of ribs, bilateral, subsequent encounter for fracture with routine healing: Secondary | ICD-10-CM | POA: Diagnosis not present

## 2019-10-13 DIAGNOSIS — R2689 Other abnormalities of gait and mobility: Secondary | ICD-10-CM | POA: Diagnosis not present

## 2019-10-13 DIAGNOSIS — R1310 Dysphagia, unspecified: Secondary | ICD-10-CM | POA: Diagnosis not present

## 2019-10-14 DIAGNOSIS — S2243XD Multiple fractures of ribs, bilateral, subsequent encounter for fracture with routine healing: Secondary | ICD-10-CM | POA: Diagnosis not present

## 2019-10-14 DIAGNOSIS — S062X9D Diffuse traumatic brain injury with loss of consciousness of unspecified duration, subsequent encounter: Secondary | ICD-10-CM | POA: Diagnosis not present

## 2019-10-14 DIAGNOSIS — R2689 Other abnormalities of gait and mobility: Secondary | ICD-10-CM | POA: Diagnosis not present

## 2019-10-14 DIAGNOSIS — M6281 Muscle weakness (generalized): Secondary | ICD-10-CM | POA: Diagnosis not present

## 2019-10-14 DIAGNOSIS — S0281XD Fracture of other specified skull and facial bones, right side, subsequent encounter for fracture with routine healing: Secondary | ICD-10-CM | POA: Diagnosis not present

## 2019-10-14 DIAGNOSIS — S065X9D Traumatic subdural hemorrhage with loss of consciousness of unspecified duration, subsequent encounter: Secondary | ICD-10-CM | POA: Diagnosis not present

## 2019-10-14 DIAGNOSIS — R1310 Dysphagia, unspecified: Secondary | ICD-10-CM | POA: Diagnosis not present

## 2019-10-14 DIAGNOSIS — R262 Difficulty in walking, not elsewhere classified: Secondary | ICD-10-CM | POA: Diagnosis not present

## 2019-10-17 DIAGNOSIS — R2689 Other abnormalities of gait and mobility: Secondary | ICD-10-CM | POA: Diagnosis not present

## 2019-10-17 DIAGNOSIS — S065X9D Traumatic subdural hemorrhage with loss of consciousness of unspecified duration, subsequent encounter: Secondary | ICD-10-CM | POA: Diagnosis not present

## 2019-10-17 DIAGNOSIS — R1310 Dysphagia, unspecified: Secondary | ICD-10-CM | POA: Diagnosis not present

## 2019-10-17 DIAGNOSIS — S2243XD Multiple fractures of ribs, bilateral, subsequent encounter for fracture with routine healing: Secondary | ICD-10-CM | POA: Diagnosis not present

## 2019-10-17 DIAGNOSIS — M6281 Muscle weakness (generalized): Secondary | ICD-10-CM | POA: Diagnosis not present

## 2019-10-17 DIAGNOSIS — R262 Difficulty in walking, not elsewhere classified: Secondary | ICD-10-CM | POA: Diagnosis not present

## 2019-10-17 DIAGNOSIS — S062X9D Diffuse traumatic brain injury with loss of consciousness of unspecified duration, subsequent encounter: Secondary | ICD-10-CM | POA: Diagnosis not present

## 2019-10-17 DIAGNOSIS — S0281XD Fracture of other specified skull and facial bones, right side, subsequent encounter for fracture with routine healing: Secondary | ICD-10-CM | POA: Diagnosis not present

## 2019-10-18 DIAGNOSIS — S065X9A Traumatic subdural hemorrhage with loss of consciousness of unspecified duration, initial encounter: Secondary | ICD-10-CM | POA: Diagnosis not present

## 2019-10-18 DIAGNOSIS — I609 Nontraumatic subarachnoid hemorrhage, unspecified: Secondary | ICD-10-CM | POA: Diagnosis not present

## 2019-10-19 ENCOUNTER — Encounter: Payer: Medicare Other | Admitting: Physical Medicine & Rehabilitation

## 2019-10-20 DIAGNOSIS — R262 Difficulty in walking, not elsewhere classified: Secondary | ICD-10-CM | POA: Diagnosis not present

## 2019-10-20 DIAGNOSIS — M6281 Muscle weakness (generalized): Secondary | ICD-10-CM | POA: Diagnosis not present

## 2019-10-20 DIAGNOSIS — S2243XD Multiple fractures of ribs, bilateral, subsequent encounter for fracture with routine healing: Secondary | ICD-10-CM | POA: Diagnosis not present

## 2019-10-20 DIAGNOSIS — R1310 Dysphagia, unspecified: Secondary | ICD-10-CM | POA: Diagnosis not present

## 2019-10-20 DIAGNOSIS — R2689 Other abnormalities of gait and mobility: Secondary | ICD-10-CM | POA: Diagnosis not present

## 2019-10-20 DIAGNOSIS — S062X9D Diffuse traumatic brain injury with loss of consciousness of unspecified duration, subsequent encounter: Secondary | ICD-10-CM | POA: Diagnosis not present

## 2019-10-20 DIAGNOSIS — S0281XD Fracture of other specified skull and facial bones, right side, subsequent encounter for fracture with routine healing: Secondary | ICD-10-CM | POA: Diagnosis not present

## 2019-10-20 DIAGNOSIS — S065X9D Traumatic subdural hemorrhage with loss of consciousness of unspecified duration, subsequent encounter: Secondary | ICD-10-CM | POA: Diagnosis not present

## 2019-10-21 DIAGNOSIS — R1310 Dysphagia, unspecified: Secondary | ICD-10-CM | POA: Diagnosis not present

## 2019-10-21 DIAGNOSIS — S0281XD Fracture of other specified skull and facial bones, right side, subsequent encounter for fracture with routine healing: Secondary | ICD-10-CM | POA: Diagnosis not present

## 2019-10-21 DIAGNOSIS — S2243XD Multiple fractures of ribs, bilateral, subsequent encounter for fracture with routine healing: Secondary | ICD-10-CM | POA: Diagnosis not present

## 2019-10-21 DIAGNOSIS — S065X9D Traumatic subdural hemorrhage with loss of consciousness of unspecified duration, subsequent encounter: Secondary | ICD-10-CM | POA: Diagnosis not present

## 2019-10-21 DIAGNOSIS — M6281 Muscle weakness (generalized): Secondary | ICD-10-CM | POA: Diagnosis not present

## 2019-10-21 DIAGNOSIS — R262 Difficulty in walking, not elsewhere classified: Secondary | ICD-10-CM | POA: Diagnosis not present

## 2019-10-21 DIAGNOSIS — R2689 Other abnormalities of gait and mobility: Secondary | ICD-10-CM | POA: Diagnosis not present

## 2019-10-21 DIAGNOSIS — S062X9D Diffuse traumatic brain injury with loss of consciousness of unspecified duration, subsequent encounter: Secondary | ICD-10-CM | POA: Diagnosis not present

## 2019-10-25 DIAGNOSIS — R2689 Other abnormalities of gait and mobility: Secondary | ICD-10-CM | POA: Diagnosis not present

## 2019-10-25 DIAGNOSIS — R262 Difficulty in walking, not elsewhere classified: Secondary | ICD-10-CM | POA: Diagnosis not present

## 2019-10-25 DIAGNOSIS — S2243XD Multiple fractures of ribs, bilateral, subsequent encounter for fracture with routine healing: Secondary | ICD-10-CM | POA: Diagnosis not present

## 2019-10-25 DIAGNOSIS — M6281 Muscle weakness (generalized): Secondary | ICD-10-CM | POA: Diagnosis not present

## 2019-10-25 DIAGNOSIS — S062X9D Diffuse traumatic brain injury with loss of consciousness of unspecified duration, subsequent encounter: Secondary | ICD-10-CM | POA: Diagnosis not present

## 2019-10-25 DIAGNOSIS — S065X9D Traumatic subdural hemorrhage with loss of consciousness of unspecified duration, subsequent encounter: Secondary | ICD-10-CM | POA: Diagnosis not present

## 2019-10-25 DIAGNOSIS — S0281XD Fracture of other specified skull and facial bones, right side, subsequent encounter for fracture with routine healing: Secondary | ICD-10-CM | POA: Diagnosis not present

## 2019-10-25 DIAGNOSIS — R1310 Dysphagia, unspecified: Secondary | ICD-10-CM | POA: Diagnosis not present

## 2019-10-26 DIAGNOSIS — S0281XD Fracture of other specified skull and facial bones, right side, subsequent encounter for fracture with routine healing: Secondary | ICD-10-CM | POA: Diagnosis not present

## 2019-10-26 DIAGNOSIS — R1310 Dysphagia, unspecified: Secondary | ICD-10-CM | POA: Diagnosis not present

## 2019-10-26 DIAGNOSIS — M6281 Muscle weakness (generalized): Secondary | ICD-10-CM | POA: Diagnosis not present

## 2019-10-26 DIAGNOSIS — S062X9D Diffuse traumatic brain injury with loss of consciousness of unspecified duration, subsequent encounter: Secondary | ICD-10-CM | POA: Diagnosis not present

## 2019-10-26 DIAGNOSIS — R2689 Other abnormalities of gait and mobility: Secondary | ICD-10-CM | POA: Diagnosis not present

## 2019-10-26 DIAGNOSIS — S065X9D Traumatic subdural hemorrhage with loss of consciousness of unspecified duration, subsequent encounter: Secondary | ICD-10-CM | POA: Diagnosis not present

## 2019-10-26 DIAGNOSIS — R262 Difficulty in walking, not elsewhere classified: Secondary | ICD-10-CM | POA: Diagnosis not present

## 2019-10-26 DIAGNOSIS — S2243XD Multiple fractures of ribs, bilateral, subsequent encounter for fracture with routine healing: Secondary | ICD-10-CM | POA: Diagnosis not present

## 2019-10-27 DIAGNOSIS — S0281XD Fracture of other specified skull and facial bones, right side, subsequent encounter for fracture with routine healing: Secondary | ICD-10-CM | POA: Diagnosis not present

## 2019-10-27 DIAGNOSIS — S065X9D Traumatic subdural hemorrhage with loss of consciousness of unspecified duration, subsequent encounter: Secondary | ICD-10-CM | POA: Diagnosis not present

## 2019-10-27 DIAGNOSIS — M6281 Muscle weakness (generalized): Secondary | ICD-10-CM | POA: Diagnosis not present

## 2019-10-27 DIAGNOSIS — R2689 Other abnormalities of gait and mobility: Secondary | ICD-10-CM | POA: Diagnosis not present

## 2019-10-27 DIAGNOSIS — R262 Difficulty in walking, not elsewhere classified: Secondary | ICD-10-CM | POA: Diagnosis not present

## 2019-10-27 DIAGNOSIS — S2243XD Multiple fractures of ribs, bilateral, subsequent encounter for fracture with routine healing: Secondary | ICD-10-CM | POA: Diagnosis not present

## 2019-10-27 DIAGNOSIS — S062X9D Diffuse traumatic brain injury with loss of consciousness of unspecified duration, subsequent encounter: Secondary | ICD-10-CM | POA: Diagnosis not present

## 2019-10-27 DIAGNOSIS — R1310 Dysphagia, unspecified: Secondary | ICD-10-CM | POA: Diagnosis not present

## 2019-10-28 DIAGNOSIS — R2689 Other abnormalities of gait and mobility: Secondary | ICD-10-CM | POA: Diagnosis not present

## 2019-10-28 DIAGNOSIS — S2243XD Multiple fractures of ribs, bilateral, subsequent encounter for fracture with routine healing: Secondary | ICD-10-CM | POA: Diagnosis not present

## 2019-10-28 DIAGNOSIS — S062X9D Diffuse traumatic brain injury with loss of consciousness of unspecified duration, subsequent encounter: Secondary | ICD-10-CM | POA: Diagnosis not present

## 2019-10-28 DIAGNOSIS — S065X9D Traumatic subdural hemorrhage with loss of consciousness of unspecified duration, subsequent encounter: Secondary | ICD-10-CM | POA: Diagnosis not present

## 2019-10-28 DIAGNOSIS — R1310 Dysphagia, unspecified: Secondary | ICD-10-CM | POA: Diagnosis not present

## 2019-10-28 DIAGNOSIS — R262 Difficulty in walking, not elsewhere classified: Secondary | ICD-10-CM | POA: Diagnosis not present

## 2019-10-28 DIAGNOSIS — S0281XD Fracture of other specified skull and facial bones, right side, subsequent encounter for fracture with routine healing: Secondary | ICD-10-CM | POA: Diagnosis not present

## 2019-10-28 DIAGNOSIS — M6281 Muscle weakness (generalized): Secondary | ICD-10-CM | POA: Diagnosis not present

## 2019-10-31 DIAGNOSIS — S062X9D Diffuse traumatic brain injury with loss of consciousness of unspecified duration, subsequent encounter: Secondary | ICD-10-CM | POA: Diagnosis not present

## 2019-11-01 DIAGNOSIS — E119 Type 2 diabetes mellitus without complications: Secondary | ICD-10-CM | POA: Diagnosis not present

## 2019-11-01 DIAGNOSIS — R1310 Dysphagia, unspecified: Secondary | ICD-10-CM | POA: Diagnosis not present

## 2019-11-01 DIAGNOSIS — I499 Cardiac arrhythmia, unspecified: Secondary | ICD-10-CM | POA: Diagnosis not present

## 2019-11-07 DIAGNOSIS — E119 Type 2 diabetes mellitus without complications: Secondary | ICD-10-CM | POA: Diagnosis not present

## 2019-11-14 DIAGNOSIS — S062X9D Diffuse traumatic brain injury with loss of consciousness of unspecified duration, subsequent encounter: Secondary | ICD-10-CM | POA: Diagnosis not present

## 2019-11-15 DIAGNOSIS — R2689 Other abnormalities of gait and mobility: Secondary | ICD-10-CM | POA: Diagnosis not present

## 2019-11-15 DIAGNOSIS — R270 Ataxia, unspecified: Secondary | ICD-10-CM | POA: Diagnosis not present

## 2019-11-15 DIAGNOSIS — I6200 Nontraumatic subdural hemorrhage, unspecified: Secondary | ICD-10-CM | POA: Diagnosis not present

## 2019-11-15 DIAGNOSIS — G479 Sleep disorder, unspecified: Secondary | ICD-10-CM | POA: Diagnosis not present

## 2019-12-14 ENCOUNTER — Encounter: Payer: Medicare Other | Admitting: Physical Medicine & Rehabilitation

## 2019-12-16 ENCOUNTER — Telehealth: Payer: Self-pay | Admitting: *Deleted

## 2019-12-16 ENCOUNTER — Other Ambulatory Visit: Payer: Self-pay

## 2019-12-16 ENCOUNTER — Encounter: Payer: Self-pay | Admitting: Registered Nurse

## 2019-12-16 ENCOUNTER — Encounter: Payer: Medicare Other | Attending: Physical Medicine & Rehabilitation | Admitting: Registered Nurse

## 2019-12-16 VITALS — BP 103/67 | HR 60 | Temp 98.0°F | Ht 64.0 in | Wt 153.0 lb

## 2019-12-16 DIAGNOSIS — H5461 Unqualified visual loss, right eye, normal vision left eye: Secondary | ICD-10-CM | POA: Insufficient documentation

## 2019-12-16 DIAGNOSIS — R2681 Unsteadiness on feet: Secondary | ICD-10-CM | POA: Diagnosis not present

## 2019-12-16 DIAGNOSIS — W19XXXA Unspecified fall, initial encounter: Secondary | ICD-10-CM | POA: Diagnosis not present

## 2019-12-16 DIAGNOSIS — W109XXS Fall (on) (from) unspecified stairs and steps, sequela: Secondary | ICD-10-CM | POA: Diagnosis not present

## 2019-12-16 DIAGNOSIS — F17210 Nicotine dependence, cigarettes, uncomplicated: Secondary | ICD-10-CM | POA: Insufficient documentation

## 2019-12-16 DIAGNOSIS — R451 Restlessness and agitation: Secondary | ICD-10-CM

## 2019-12-16 DIAGNOSIS — Y92009 Unspecified place in unspecified non-institutional (private) residence as the place of occurrence of the external cause: Secondary | ICD-10-CM | POA: Diagnosis not present

## 2019-12-16 DIAGNOSIS — S069X3S Unspecified intracranial injury with loss of consciousness of 1 hour to 5 hours 59 minutes, sequela: Secondary | ICD-10-CM

## 2019-12-16 DIAGNOSIS — Y9301 Activity, walking, marching and hiking: Secondary | ICD-10-CM | POA: Insufficient documentation

## 2019-12-16 DIAGNOSIS — R410 Disorientation, unspecified: Secondary | ICD-10-CM | POA: Diagnosis not present

## 2019-12-16 DIAGNOSIS — S065X9S Traumatic subdural hemorrhage with loss of consciousness of unspecified duration, sequela: Secondary | ICD-10-CM | POA: Diagnosis not present

## 2019-12-16 DIAGNOSIS — G479 Sleep disorder, unspecified: Secondary | ICD-10-CM | POA: Diagnosis not present

## 2019-12-16 DIAGNOSIS — S0219XS Other fracture of base of skull, sequela: Secondary | ICD-10-CM | POA: Diagnosis not present

## 2019-12-16 DIAGNOSIS — E119 Type 2 diabetes mellitus without complications: Secondary | ICD-10-CM | POA: Insufficient documentation

## 2019-12-16 MED ORDER — QUETIAPINE FUMARATE 50 MG PO TABS: 50.0000 mg | ORAL_TABLET | Freq: Every day | ORAL | 1 refills | Status: DC

## 2019-12-16 MED ORDER — PROPRANOLOL HCL 20 MG PO TABS: 20.0000 mg | ORAL_TABLET | Freq: Three times a day (TID) | ORAL | 1 refills | Status: DC

## 2019-12-16 MED ORDER — POLYETHYLENE GLYCOL 3350 17 G PO PACK: 17.0000 g | PACK | Freq: Every day | ORAL | 0 refills | Status: AC | PRN
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
## 2019-12-16 NOTE — Patient Instructions (Signed)
Mr. Panas needs a obtain a Primary Care Physician:

## 2019-12-16 NOTE — Telephone Encounter (Signed)
[name] called and says she had a form faxed to our office that Jeremiah West needs to sign within 48 hours for [facility] ---Jeremiah West to fill out and fax back to them.  The form is [id number].

## 2019-12-20 NOTE — Telephone Encounter (Signed)
Return Jeremiah West call no answer left message to return the call. This provider placed a call to Jeremiah West the CAP DA office support staff . Her  number is [phone]." Jeremiah West states the form has to come from [place] not the sister. Will speak with office staff regarding the above.

## 2020-01-11 ENCOUNTER — Telehealth: Payer: Self-pay | Admitting: Registered Nurse

## 2020-01-11 NOTE — Telephone Encounter (Signed)
Placed a call to Ms. Jeremiah West regarding the Personal Care Aide form, she is aware this provider spoke with [facility]  regarding the form. All boxes were completed and faxed to the company. Left message on Ms. Jeremiah West phone regarding the above.

## 2020-01-12 ENCOUNTER — Other Ambulatory Visit: Payer: Self-pay | Admitting: Registered Nurse

## 2020-01-18 ENCOUNTER — Encounter: Payer: Medicare Other | Attending: Physical Medicine & Rehabilitation | Admitting: Physical Medicine & Rehabilitation

## 2020-01-18 ENCOUNTER — Encounter: Payer: Self-pay | Admitting: Physical Medicine & Rehabilitation

## 2020-01-18 ENCOUNTER — Other Ambulatory Visit: Payer: Self-pay

## 2020-01-18 VITALS — BP 138/74 | HR 47 | Temp 98.4°F | Ht 64.0 in | Wt 151.0 lb

## 2020-01-18 DIAGNOSIS — S065X9S Traumatic subdural hemorrhage with loss of consciousness of unspecified duration, sequela: Secondary | ICD-10-CM | POA: Insufficient documentation

## 2020-01-18 DIAGNOSIS — H5461 Unqualified visual loss, right eye, normal vision left eye: Secondary | ICD-10-CM | POA: Diagnosis not present

## 2020-01-18 DIAGNOSIS — E119 Type 2 diabetes mellitus without complications: Secondary | ICD-10-CM | POA: Diagnosis not present

## 2020-01-18 DIAGNOSIS — S069X3S Unspecified intracranial injury with loss of consciousness of 1 hour to 5 hours 59 minutes, sequela: Secondary | ICD-10-CM | POA: Insufficient documentation

## 2020-01-18 DIAGNOSIS — G479 Sleep disorder, unspecified: Secondary | ICD-10-CM | POA: Diagnosis not present

## 2020-01-18 DIAGNOSIS — G3189 Other specified degenerative diseases of nervous system: Secondary | ICD-10-CM | POA: Insufficient documentation

## 2020-01-18 DIAGNOSIS — R2681 Unsteadiness on feet: Secondary | ICD-10-CM | POA: Diagnosis not present

## 2020-01-18 DIAGNOSIS — F17210 Nicotine dependence, cigarettes, uncomplicated: Secondary | ICD-10-CM | POA: Diagnosis not present

## 2020-01-18 DIAGNOSIS — S069X9S Unspecified intracranial injury with loss of consciousness of unspecified duration, sequela: Secondary | ICD-10-CM | POA: Diagnosis not present

## 2020-01-18 DIAGNOSIS — S0219XS Other fracture of base of skull, sequela: Secondary | ICD-10-CM | POA: Insufficient documentation

## 2020-01-18 DIAGNOSIS — R410 Disorientation, unspecified: Secondary | ICD-10-CM | POA: Diagnosis not present

## 2020-01-18 DIAGNOSIS — F09 Unspecified mental disorder due to known physiological condition: Secondary | ICD-10-CM

## 2020-01-18 NOTE — Patient Instructions (Signed)
PLEASE FEEL FREE TO CALL OUR OFFICE WITH ANY PROBLEMS OR QUESTIONS ([phone])      
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
## 2020-02-05 ENCOUNTER — Other Ambulatory Visit: Payer: Self-pay | Admitting: Physical Medicine & Rehabilitation

## 2020-03-01 ENCOUNTER — Other Ambulatory Visit: Payer: Self-pay | Admitting: Physical Medicine & Rehabilitation

## 2020-03-16 ENCOUNTER — Other Ambulatory Visit: Payer: Self-pay | Admitting: Physical Medicine & Rehabilitation

## 2020-03-21 ENCOUNTER — Encounter: Payer: Medicare Other | Admitting: Physical Medicine & Rehabilitation

## 2020-04-25 DIAGNOSIS — G44309 Post-traumatic headache, unspecified, not intractable: Secondary | ICD-10-CM | POA: Diagnosis not present

## 2020-04-25 DIAGNOSIS — Z0000 Encounter for general adult medical examination without abnormal findings: Secondary | ICD-10-CM | POA: Diagnosis not present

## 2020-04-25 DIAGNOSIS — Z131 Encounter for screening for diabetes mellitus: Secondary | ICD-10-CM | POA: Diagnosis not present

## 2020-04-25 DIAGNOSIS — Z720 Tobacco use: Secondary | ICD-10-CM | POA: Diagnosis not present

## 2020-04-25 DIAGNOSIS — Z87820 Personal history of traumatic brain injury: Secondary | ICD-10-CM | POA: Diagnosis not present

## 2020-04-25 DIAGNOSIS — Z13220 Encounter for screening for lipoid disorders: Secondary | ICD-10-CM | POA: Diagnosis not present

## 2020-05-09 ENCOUNTER — Encounter: Payer: Medicare Other | Attending: Physical Medicine & Rehabilitation | Admitting: Physical Medicine & Rehabilitation

## 2020-05-31 DIAGNOSIS — Z720 Tobacco use: Secondary | ICD-10-CM | POA: Diagnosis not present

## 2020-05-31 DIAGNOSIS — G4489 Other headache syndrome: Secondary | ICD-10-CM | POA: Diagnosis not present

## 2020-05-31 DIAGNOSIS — Z0001 Encounter for general adult medical examination with abnormal findings: Secondary | ICD-10-CM | POA: Diagnosis not present

## 2020-07-13 ENCOUNTER — Other Ambulatory Visit: Payer: Self-pay | Admitting: Physical Medicine & Rehabilitation

## 2020-09-14 ENCOUNTER — Other Ambulatory Visit: Payer: Self-pay | Admitting: Physical Medicine & Rehabilitation

## 2020-10-25 DIAGNOSIS — R3129 Other microscopic hematuria: Secondary | ICD-10-CM | POA: Diagnosis not present

## 2020-10-25 DIAGNOSIS — Z720 Tobacco use: Secondary | ICD-10-CM | POA: Diagnosis not present

## 2020-10-25 DIAGNOSIS — G4489 Other headache syndrome: Secondary | ICD-10-CM | POA: Diagnosis not present

## 2020-10-25 DIAGNOSIS — R350 Frequency of micturition: Secondary | ICD-10-CM | POA: Diagnosis not present

## 2020-11-06 DIAGNOSIS — Z720 Tobacco use: Secondary | ICD-10-CM | POA: Diagnosis not present

## 2020-11-06 DIAGNOSIS — G4489 Other headache syndrome: Secondary | ICD-10-CM | POA: Diagnosis not present

## 2020-11-06 DIAGNOSIS — R3129 Other microscopic hematuria: Secondary | ICD-10-CM | POA: Diagnosis not present

## 2020-11-21 DIAGNOSIS — Z720 Tobacco use: Secondary | ICD-10-CM | POA: Diagnosis not present

## 2020-11-21 DIAGNOSIS — G4489 Other headache syndrome: Secondary | ICD-10-CM | POA: Diagnosis not present

## 2020-11-21 DIAGNOSIS — D72818 Other decreased white blood cell count: Secondary | ICD-10-CM | POA: Diagnosis not present

## 2020-12-18 DIAGNOSIS — I700 Atherosclerosis of aorta: Secondary | ICD-10-CM | POA: Diagnosis not present

## 2020-12-18 DIAGNOSIS — F17211 Nicotine dependence, cigarettes, in remission: Secondary | ICD-10-CM | POA: Diagnosis not present

## 2020-12-18 DIAGNOSIS — Z79899 Other long term (current) drug therapy: Secondary | ICD-10-CM | POA: Diagnosis not present

## 2020-12-18 DIAGNOSIS — E559 Vitamin D deficiency, unspecified: Secondary | ICD-10-CM | POA: Diagnosis not present

## 2020-12-18 DIAGNOSIS — E119 Type 2 diabetes mellitus without complications: Secondary | ICD-10-CM | POA: Diagnosis not present

## 2020-12-18 DIAGNOSIS — J439 Emphysema, unspecified: Secondary | ICD-10-CM | POA: Diagnosis not present

## 2020-12-18 DIAGNOSIS — I509 Heart failure, unspecified: Secondary | ICD-10-CM | POA: Diagnosis not present

## 2020-12-18 DIAGNOSIS — Z23 Encounter for immunization: Secondary | ICD-10-CM | POA: Diagnosis not present

## 2021-08-26 IMAGING — CT CT ABD-PELV W/ CM
2 of 5 series · 10 of 36 positions shown, 12 images · IV contrast (Omni 300)
Comparison: CT chest, abdomen and pelvis, December 13, 2015, same day
chest radiograph.

CLINICAL DATA: Fall down 15 steps

EXAM:
CT CHEST, ABDOMEN, AND PELVIS WITH CONTRAST
TECHNIQUE: Multidetector CT imaging of the chest, abdomen and pelvis was
performed following the standard protocol during bolus
administration of intravenous contrast.
CONTRAST:  100mL OMNIPAQUE IOHEXOL 300 MG/ML  SOLN

[Series 3: cap with 5mm st · axial · 0.89mm/px · z∈[-883,-253]mm · 7 of 152 slices shown, 9 images]
[im 13/152  mediastinal]
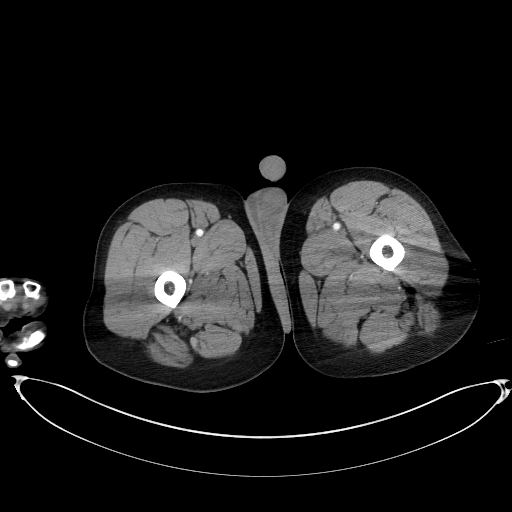
[im 13/152  lung]
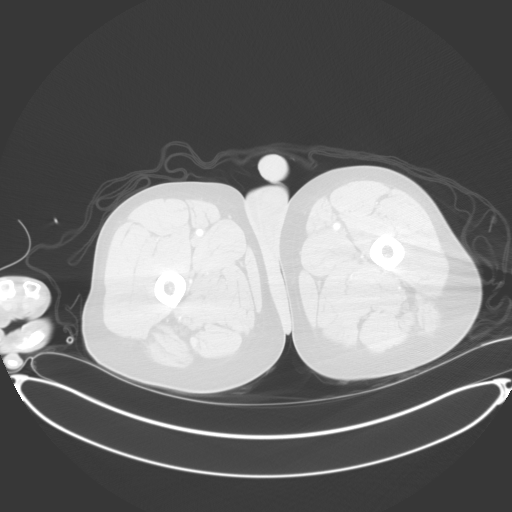
[im 38/152  lung]
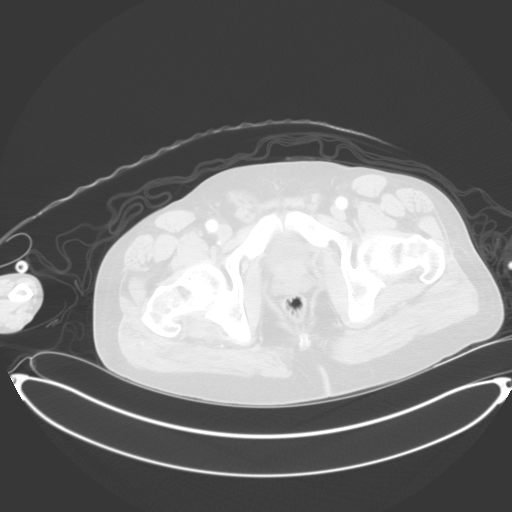
[im 51/152  lung]
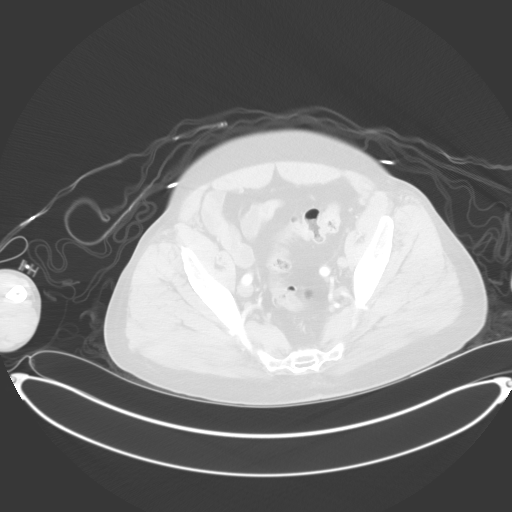
[im 76/152  lung]
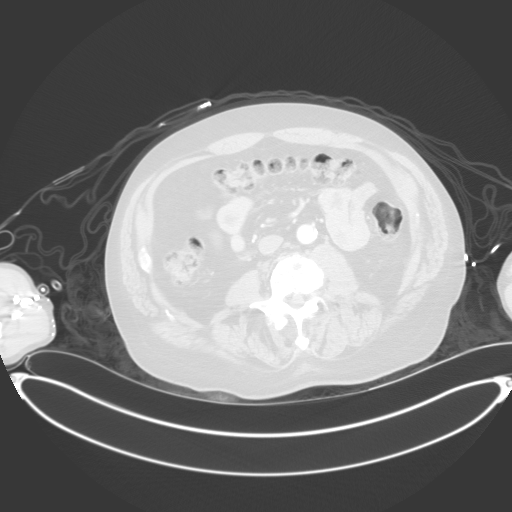
[im 101/152  mediastinal]
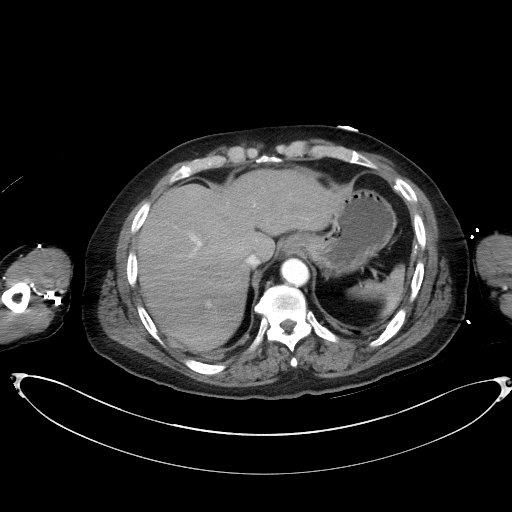
[im 101/152  lung]
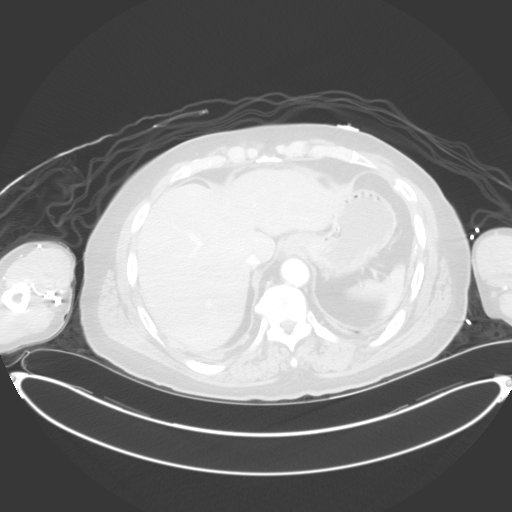
[im 114/152  lung]
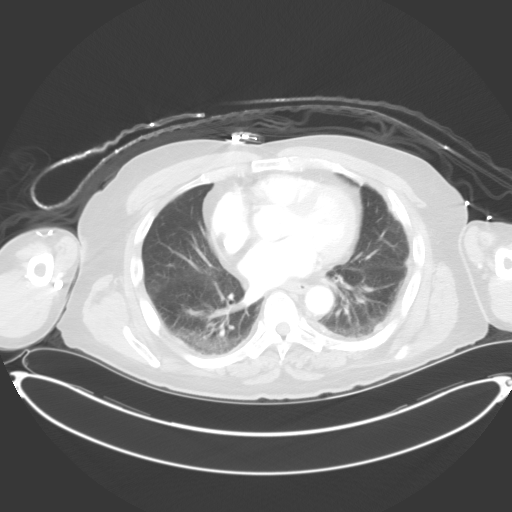
[im 139/152  lung]
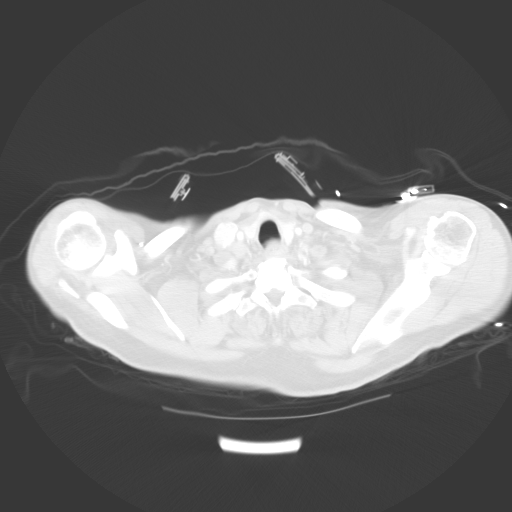

[Series 5: cap with 3mm st cor · coronal · 0.74mm/px · 3 of 151 slices shown]
[im 31/151  lung]
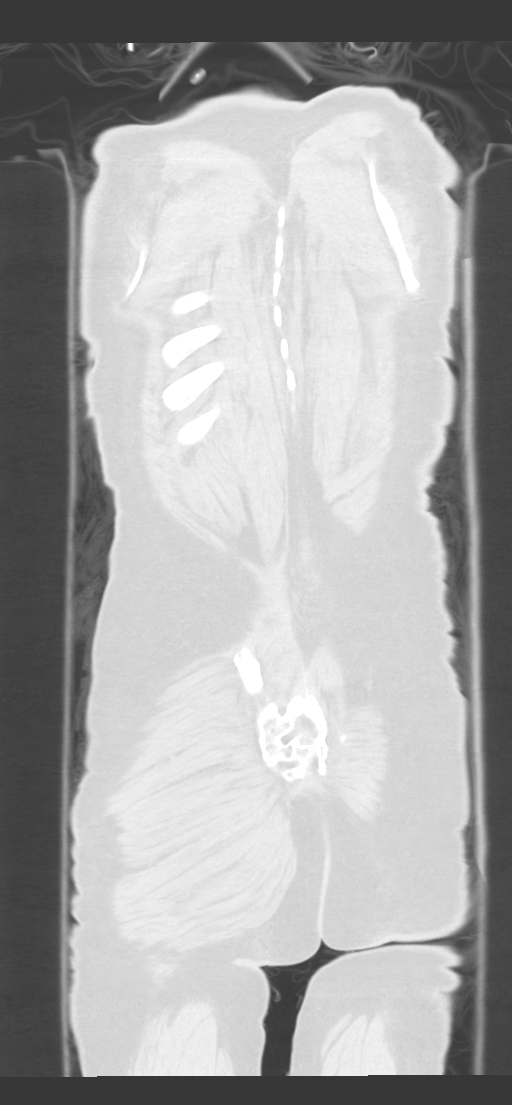
[im 61/151  lung]
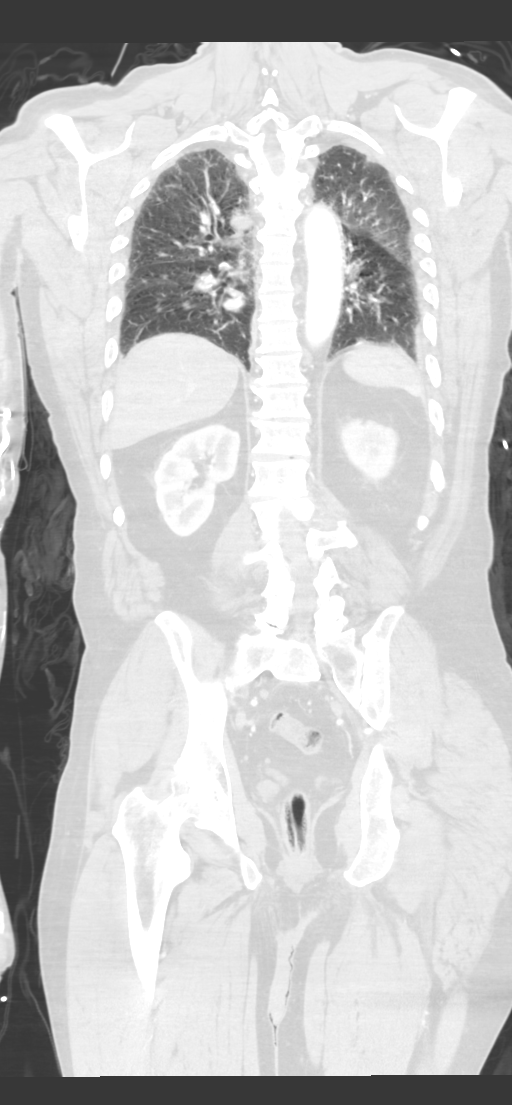
[im 91/151  lung]
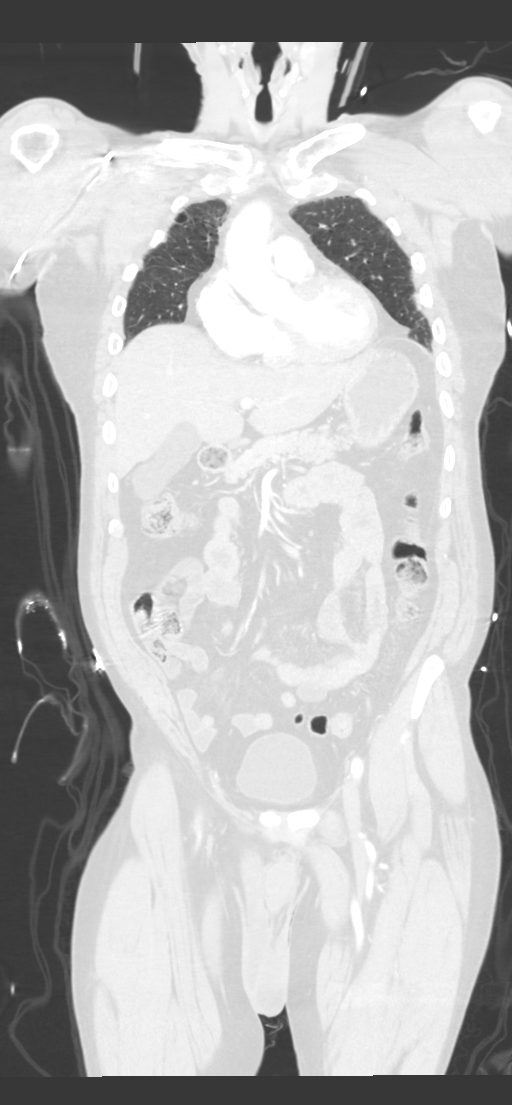

[10 of 36 positions shown; findings below may reference images not displayed]

FINDINGS: CT CHEST FINDINGS

Cardiovascular: The aortic root is suboptimally assessed given
cardiac pulsation artifact. Thoracic aorta is normal caliber. No
intramural hematoma, dissection flap or other acute luminal
abnormality of the aorta is seen. No periaortic stranding or
hemorrhage. Shared origin of the brachiocephalic and left common
carotid artery proximal great vessels normally opacified without
acute traumatic abnormality. Mild cardiomegaly. No pericardial
effusion. Central pulmonary arteries are normal caliber. No large
central filling defects on this non tailored examination of the
pulmonary arteries.

Mediastinum/Nodes: No mediastinal fluid or gas. Normal thyroid gland
and thoracic inlet. No acute abnormality of the trachea or
esophagus. No worrisome mediastinal, hilar or axillary adenopathy.

Lungs/Pleura: No acute traumatic abnormality of the lung parenchyma.
Centrilobular and paraseptal emphysematous changes are similar to
comparison exam. There is interlobular septal thickening present
towards the apices and lung bases. Mild fissural thickening is noted
as well. Some ground-glass attenuation in the lung bases favoring
atelectasis given the low volumes and central vascular crowding. No
frank pleural effusion or pneumothorax. Scattered calcified pleural
plaques are present.

Musculoskeletal: Right fifth, sixth and seventh remote lateral rib
fracture. Remote left fifth, sixth, seventh, eighth, ninth, tenth
and eleventh rib fracture deformities are noted as well. Remote
postsurgical changes of the distal left clavicle. Moderate bilateral
glenohumeral and right acromioclavicular arthrosis. Included
portions of the thoracic spine Multilevel degenerative changes are
present in the imaged portions of the spine. Features are most
pronounced towards the upper thoracic spine. No acute fracture or
vertebral body height loss is seen. No traumatic listhesis. Stable
bifid appearance of the T1 spinous process. Mild levocurvature upper
thoracic spine is unchanged from prior. Mild bilateral gynecomastia.

CT ABDOMEN PELVIS FINDINGS

Hepatobiliary: No direct hepatic injury or perihepatic hematoma. No
focal liver lesion. No gallbladder wall thickening, visible
gallstone or biliary ductal dilatation.

Pancreas: No direct traumatic pancreatic injury. Mild partial fatty
replacement of the pancreas. No peripancreatic inflammation or
ductal dilatation.

Spleen: No direct splenic injury or perisplenic hematoma. Normal
splenic size. No concerning splenic lesions.

Adrenals/Urinary Tract: Normal adrenal glands. No adrenal hemorrhage
or suspicious adrenal lesions. No direct renal injury or perinephric
hematoma. Kidneys enhance and excrete symmetrically without
extravasation of contrast on excretory phase delayed imaging.
Scattered subcentimeter hypoattenuating foci in the left kidney too
small to fully characterize on CT imaging but statistically likely
benign. No worrisome renal mass, hydronephrosis or urolithiasis.
Urinary bladder is unremarkable without evidence of acute bladder
injury.

Stomach/Bowel: Distal esophagus, stomach and duodenal sweep are
unremarkable. No small bowel wall thickening or dilatation. No
evidence of obstruction. A normal appendix is visualized. No colonic
dilatation or wall thickening. No mesenteric hematoma or contusive
changes are evident.

Vascular/Lymphatic: Minimal atheromatous plaque in the abdominal
aorta. No direct vascular injury or acute vascular abnormality in
the abdomen or pelvis nor in the included portions of the upper
extremities. No suspicious or enlarged lymph nodes in the included
lymphatic chains.

Reproductive: Normal prostate and seminal vesicles. Moderate right
hydrocele is noted with high-riding appearance of the right
testicle, hyperattenuation in the region of the epididymis
nonspecific. Small left hydrocele present as well.

Other: No abdominopelvic free fluid or air. No bowel containing
hernias. No traumatic abdominal wall dehiscence or body wall
hematoma.

Musculoskeletal: Multilevel discogenic and facet degenerative
changes are present in the lumbar spine. Advanced sclerotic endplate
changes at L1-2 with numerous subcortical cysts are favored to be on
a degenerative basis in the absence of features to suggest
discitis/osteomyelitis. There is levocurvature of the lumbar spine
with an apex at L4 which is similar to comparison. The bony pelvis
is intact and congruent. Proximal femora are well seated within the
acetabula. No proximal femoral fracture. Mild contusive changes are
noted along the right posterolateral hip.
IMPRESSION: 1. Minimal contusive changes the right posterolateral hip.
2. Moderate right and small left hydrocele. High-riding appearance
of the right testicle, hyperattenuation in the region of the
epididymis nonspecific. Recommend correlation with visual inspection
and clinical findings to exclude the possibility of a testicular
injury.
3. No other acute traumatic injury in the chest, abdomen or pelvis.
4. Multiple remote bilateral rib deformities, detailed above.
5. Calcified pleural plaques compatible with prior asbestos
exposure.
6. Features of CHF/volume overload with cardiomegaly and findings of
interstitial edema.
7. Aortic Atherosclerosis (SM843-RNP.P)
8. Emphysema (SM843-EOS.P).
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
## 2021-10-15 ENCOUNTER — Encounter: Payer: Self-pay | Admitting: Neurology

## 2021-10-15 ENCOUNTER — Ambulatory Visit (INDEPENDENT_AMBULATORY_CARE_PROVIDER_SITE_OTHER): Payer: Medicare Other | Admitting: Neurology

## 2021-10-15 VITALS — BP 101/62 | HR 80 | Ht 64.0 in | Wt 150.0 lb

## 2021-10-15 DIAGNOSIS — G301 Alzheimer's disease with late onset: Secondary | ICD-10-CM | POA: Diagnosis not present

## 2021-10-15 DIAGNOSIS — F02B0 Dementia in other diseases classified elsewhere, moderate, without behavioral disturbance, psychotic disturbance, mood disturbance, and anxiety: Secondary | ICD-10-CM

## 2021-10-15 MED ORDER — MEMANTINE HCL 10 MG PO TABS: 10.0000 mg | ORAL_TABLET | Freq: Two times a day (BID) | ORAL | 12 refills | Status: DC

## 2021-10-15 MED ORDER — DONEPEZIL HCL 5 MG PO TABS: 5.0000 mg | ORAL_TABLET | Freq: Every day | ORAL | 12 refills | Status: AC

## 2021-10-16 ENCOUNTER — Telehealth: Payer: Self-pay | Admitting: *Deleted

## 2021-10-16 ENCOUNTER — Telehealth: Payer: Self-pay | Admitting: Neurology

## 2021-10-16 LAB — VITAMIN D 25 HYDROXY (VIT D DEFICIENCY, FRACTURES): Vit D, 25-Hydroxy: 20.9 ng/mL — ABNORMAL LOW (ref 30.0–100.0)

## 2021-10-16 LAB — TSH: TSH: 0.641 u[IU]/mL (ref 0.450–4.500)

## 2021-10-16 LAB — VITAMIN B12: Vitamin B-12: 405 pg/mL (ref 232–1245)

## 2021-10-16 NOTE — Progress Notes (Signed)
Please call and advise the patient sister [name] that the recent dementia labs we checked were within normal limits except the vitamin D level which was low. We checked  vitamin D level, vitamin B12 level and thyroid function. At the moment, will recommend over the counter daily Vitamin d supplement 800 units. Please remind patient to keep any upcoming appointments or tests and to call us with any interim questions, concerns, problems or updates. Thanks,   Windell Norfolk, MD

## 2021-10-16 NOTE — Telephone Encounter (Signed)
-----   Message from Windell Norfolk, MD sent at 10/16/2021  9:14 AM EDT ----- Please call and advise the patient sister [name] that the recent dementia labs we checked were within normal limits except the vitamin D level which was low. We checked  vitamin D level, vitamin B12 level and thyroid function. At the moment, will recommend over the counter daily Vitamin d supplement 800 units. Please remind patient to keep any upcoming appointments or tests and to call us with any interim questions, concerns, problems or updates. Thanks,   Windell Norfolk, MD

## 2021-10-16 NOTE — Telephone Encounter (Signed)
Left a detailed message, with results and supplement recommendation, on his sister's voicemail (ok per DPR).  Provided our number to call back with any questions.

## 2021-10-16 NOTE — Telephone Encounter (Signed)
[employer] medicare/[employer] med[employer] NPR sent to [facility] 

## 2021-11-14 ENCOUNTER — Ambulatory Visit
Admission: RE | Admit: 2021-11-14 | Discharge: 2021-11-14 | Disposition: A | Discharge status: Home / Self Care | Payer: Medicare Other | Source: Ambulatory Visit | Attending: Neurology | Admitting: Neurology

## 2021-11-14 ENCOUNTER — Other Ambulatory Visit: Payer: Self-pay | Admitting: Neurology

## 2021-11-14 ENCOUNTER — Telehealth: Payer: Self-pay

## 2021-11-14 DIAGNOSIS — G301 Alzheimer's disease with late onset: Secondary | ICD-10-CM

## 2021-11-14 NOTE — Telephone Encounter (Signed)
Rx filled for 90 day supply. 
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
## 2021-11-14 NOTE — Telephone Encounter (Signed)
I attempted to call the pt's sister; she was unavailable and # did not connect to vm.  Will try again next week.

## 2021-11-18 ENCOUNTER — Telehealth: Payer: Self-pay | Admitting: *Deleted

## 2021-11-18 NOTE — Telephone Encounter (Signed)
I left a VM for the patient's sister, [name] (as per [facility]). I provided a call back number for a return call.
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
## 2021-11-19 NOTE — Telephone Encounter (Signed)
I spoke to his sister on DPR and provided the CT results. She verbalized understanding.

## 2022-06-04 LAB — GLUCOSE, POCT (MANUAL RESULT ENTRY): POC Glucose: 113 mg/dl — AB (ref 70–99)

## 2022-06-04 NOTE — Progress Notes (Signed)
Pt has had breakfast today

## 2022-06-09 ENCOUNTER — Encounter: Payer: Self-pay | Admitting: Podiatry

## 2022-06-09 ENCOUNTER — Ambulatory Visit (INDEPENDENT_AMBULATORY_CARE_PROVIDER_SITE_OTHER): Payer: 59 | Admitting: Podiatry

## 2022-06-09 DIAGNOSIS — E119 Type 2 diabetes mellitus without complications: Secondary | ICD-10-CM

## 2022-06-09 DIAGNOSIS — B351 Tinea unguium: Secondary | ICD-10-CM

## 2022-06-09 DIAGNOSIS — M79674 Pain in right toe(s): Secondary | ICD-10-CM | POA: Diagnosis not present

## 2022-06-09 DIAGNOSIS — M79675 Pain in left toe(s): Secondary | ICD-10-CM | POA: Diagnosis not present
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
## 2022-06-16 ENCOUNTER — Encounter: Payer: Self-pay | Admitting: *Deleted

## 2022-06-16 NOTE — Progress Notes (Unsigned)
Currently unable to contact pt after 06/04/22 screening event- chart review indicates February 2024 contact with Dr. Beverely Risen MD at [facility] in [facility]. However, Dr. Michaell Cowing office states althought pt established with that office, pt's last visit occurred in 05/2021. [place] on pt's phone for PCP f/u.

## 2022-09-10 ENCOUNTER — Encounter: Payer: Self-pay | Admitting: Neurology

## 2022-10-06 ENCOUNTER — Ambulatory Visit: Payer: 59 | Admitting: Podiatry

## 2022-10-16 ENCOUNTER — Ambulatory Visit: Payer: Medicare Other | Admitting: Neurology

## 2022-10-21 ENCOUNTER — Encounter: Payer: Self-pay | Admitting: Podiatry

## 2022-10-21 ENCOUNTER — Ambulatory Visit (INDEPENDENT_AMBULATORY_CARE_PROVIDER_SITE_OTHER): Payer: 59 | Admitting: Podiatry

## 2022-10-21 DIAGNOSIS — B351 Tinea unguium: Secondary | ICD-10-CM

## 2022-10-21 DIAGNOSIS — M79675 Pain in left toe(s): Secondary | ICD-10-CM | POA: Diagnosis not present

## 2022-10-21 DIAGNOSIS — M79674 Pain in right toe(s): Secondary | ICD-10-CM

## 2022-10-21 DIAGNOSIS — E119 Type 2 diabetes mellitus without complications: Secondary | ICD-10-CM | POA: Diagnosis not present
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
## 2023-07-02 ENCOUNTER — Ambulatory Visit (INDEPENDENT_AMBULATORY_CARE_PROVIDER_SITE_OTHER): Admitting: Podiatry

## 2023-07-02 ENCOUNTER — Encounter: Payer: Self-pay | Admitting: Podiatry

## 2023-07-02 DIAGNOSIS — M79674 Pain in right toe(s): Secondary | ICD-10-CM

## 2023-07-02 DIAGNOSIS — B351 Tinea unguium: Secondary | ICD-10-CM | POA: Diagnosis not present

## 2023-07-02 DIAGNOSIS — M79675 Pain in left toe(s): Secondary | ICD-10-CM | POA: Diagnosis not present

## 2023-07-02 DIAGNOSIS — E119 Type 2 diabetes mellitus without complications: Secondary | ICD-10-CM

## 2023-07-02 NOTE — Progress Notes (Signed)
 This patient presents to the office with chief complaint of long thick painful nails.  Patient says the nails are painful walking and wearing shoes.  This patient is unable to self treat.  This patient is unable to trim hisnails since she is unable to reach his nails.  he presents to the office for preventative foot care services.  General Appearance  Alert, conversant and in no acute stress.  Vascular  Dorsalis pedis and posterior tibial  pulses are palpable  bilaterally.  Capillary return is within normal limits  bilaterally. Temperature is within normal limits  bilaterally.  Neurologic  Senn-Weinstein monofilament wire test within normal limits  bilaterally. Muscle power within normal limits bilaterally.  Nails Thick disfigured discolored nails with subungual debris  from hallux to fifth toes bilaterally. No evidence of bacterial infection or drainage bilaterally.  Orthopedic  No limitations of motion  feet .  No crepitus or effusions noted.  No bony pathology or digital deformities noted.  Skin  normotropic skin with no porokeratosis noted bilaterally.  No signs of infections or ulcers noted.     Onychomycosis  Nails  B/L.  Pain in right toes  Pain in left toes  Debridement of nails both feet followed trimming the nails with dremel tool.    RTC 3 months.   Helane Gunther DPM

## 2023-08-24 ENCOUNTER — Other Ambulatory Visit: Payer: Self-pay | Admitting: Family

## 2023-08-24 DIAGNOSIS — Z0000 Encounter for general adult medical examination without abnormal findings: Secondary | ICD-10-CM

## 2023-09-08 ENCOUNTER — Inpatient Hospital Stay: Admission: RE | Admit: 2023-09-08 | Source: Ambulatory Visit

## 2023-09-17 ENCOUNTER — Inpatient Hospital Stay: Admission: RE | Admit: 2023-09-17 | Source: Ambulatory Visit

## 2023-10-05 ENCOUNTER — Ambulatory Visit: Admitting: Podiatry

## 2023-10-07 ENCOUNTER — Encounter: Payer: Self-pay | Admitting: Podiatry

## 2023-10-07 ENCOUNTER — Ambulatory Visit (INDEPENDENT_AMBULATORY_CARE_PROVIDER_SITE_OTHER): Admitting: Podiatry

## 2023-10-07 DIAGNOSIS — B351 Tinea unguium: Secondary | ICD-10-CM | POA: Diagnosis not present

## 2023-10-07 DIAGNOSIS — M79674 Pain in right toe(s): Secondary | ICD-10-CM | POA: Diagnosis not present

## 2023-10-07 DIAGNOSIS — M79675 Pain in left toe(s): Secondary | ICD-10-CM | POA: Diagnosis not present

## 2023-10-07 DIAGNOSIS — E119 Type 2 diabetes mellitus without complications: Secondary | ICD-10-CM

## 2023-10-07 NOTE — Progress Notes (Signed)
 This patient presents to the office with chief complaint of long thick painful nails.  Patient says the nails are painful walking and wearing shoes.  This patient is unable to self treat.  This patient is unable to trim hisnails since she is unable to reach his nails.  he presents to the office for preventative foot care services.  General Appearance  Alert, conversant and in no acute stress.  Vascular  Dorsalis pedis and posterior tibial  pulses are palpable  bilaterally.  Capillary return is within normal limits  bilaterally. Temperature is within normal limits  bilaterally.  Neurologic  Senn-Weinstein monofilament wire test within normal limits  bilaterally. Muscle power within normal limits bilaterally.  Nails Thick disfigured discolored nails with subungual debris  from hallux to fifth toes bilaterally. No evidence of bacterial infection or drainage bilaterally.  Orthopedic  No limitations of motion  feet .  No crepitus or effusions noted.  No bony pathology or digital deformities noted.  Skin  normotropic skin with no porokeratosis noted bilaterally.  No signs of infections or ulcers noted.     Onychomycosis  Nails  B/L.  Pain in right toes  Pain in left toes  Debridement of nails both feet followed trimming the nails with dremel tool.    RTC 3 months.   Helane Gunther DPM

## 2024-01-07 ENCOUNTER — Ambulatory Visit: Admitting: Podiatry

## 2024-02-29 ENCOUNTER — Encounter: Payer: Self-pay | Admitting: Podiatry

## 2024-02-29 ENCOUNTER — Ambulatory Visit (INDEPENDENT_AMBULATORY_CARE_PROVIDER_SITE_OTHER): Admitting: Podiatry

## 2024-02-29 DIAGNOSIS — B351 Tinea unguium: Secondary | ICD-10-CM

## 2024-02-29 DIAGNOSIS — M79674 Pain in right toe(s): Secondary | ICD-10-CM

## 2024-02-29 DIAGNOSIS — M79675 Pain in left toe(s): Secondary | ICD-10-CM

## 2024-02-29 NOTE — Progress Notes (Signed)
 This patient presents to the office with chief complaint of long thick painful nails.  Patient says the nails are painful walking and wearing shoes.  This patient is unable to self treat.  This patient is unable to trim hisnails since she is unable to reach his nails.  he presents to the office for preventative foot care services.  General Appearance  Alert, conversant and in no acute stress.  Vascular  Dorsalis pedis and posterior tibial  pulses are palpable  bilaterally.  Capillary return is within normal limits  bilaterally. Temperature is within normal limits  bilaterally.  Neurologic  Senn-Weinstein monofilament wire test within normal limits  bilaterally. Muscle power within normal limits bilaterally.  Nails Thick disfigured discolored nails with subungual debris  from hallux to fifth toes bilaterally. No evidence of bacterial infection or drainage bilaterally.  Orthopedic  No limitations of motion  feet .  No crepitus or effusions noted.  No bony pathology or digital deformities noted.  Skin  normotropic skin with no porokeratosis noted bilaterally.  No signs of infections or ulcers noted.     Onychomycosis  Nails  B/L.  Pain in right toes  Pain in left toes  Debridement of nails both feet followed trimming the nails with dremel tool.    RTC 3 months.   Helane Gunther DPM

## 2024-05-31 ENCOUNTER — Ambulatory Visit: Admitting: Podiatry

## 2024-06-22 ENCOUNTER — Institutional Professional Consult (permissible substitution): Admitting: Neurology
